# Patient Record
Sex: Female | Born: 1965 | Race: Black or African American | Hispanic: No | Marital: Single | Smoking: Current every day smoker
Health system: Southern US, Community
[De-identification: ages and names within clinical notes are randomized; demographics above are authoritative.]

## PROBLEM LIST (undated history)

## (undated) DIAGNOSIS — D649 Anemia, unspecified: Secondary | ICD-10-CM

## (undated) DIAGNOSIS — L309 Dermatitis, unspecified: Secondary | ICD-10-CM

## (undated) DIAGNOSIS — N179 Acute kidney failure, unspecified: Secondary | ICD-10-CM

## (undated) DIAGNOSIS — F329 Major depressive disorder, single episode, unspecified: Secondary | ICD-10-CM

## (undated) DIAGNOSIS — F209 Schizophrenia, unspecified: Secondary | ICD-10-CM

## (undated) DIAGNOSIS — R569 Unspecified convulsions: Secondary | ICD-10-CM

## (undated) DIAGNOSIS — M419 Scoliosis, unspecified: Secondary | ICD-10-CM

## (undated) DIAGNOSIS — F1411 Cocaine abuse, in remission: Secondary | ICD-10-CM

## (undated) DIAGNOSIS — M1990 Unspecified osteoarthritis, unspecified site: Secondary | ICD-10-CM

## (undated) DIAGNOSIS — F319 Bipolar disorder, unspecified: Secondary | ICD-10-CM

## (undated) DIAGNOSIS — R0600 Dyspnea, unspecified: Secondary | ICD-10-CM

## (undated) DIAGNOSIS — J45909 Unspecified asthma, uncomplicated: Secondary | ICD-10-CM

## (undated) DIAGNOSIS — I639 Cerebral infarction, unspecified: Secondary | ICD-10-CM

## (undated) DIAGNOSIS — T7840XA Allergy, unspecified, initial encounter: Secondary | ICD-10-CM

## (undated) DIAGNOSIS — R450 Nervousness: Secondary | ICD-10-CM

## (undated) DIAGNOSIS — F32A Depression, unspecified: Secondary | ICD-10-CM

## (undated) DIAGNOSIS — Z720 Tobacco use: Secondary | ICD-10-CM

## (undated) HISTORY — PX: DILATION AND CURETTAGE OF UTERUS: SHX78

## (undated) HISTORY — DX: Unspecified osteoarthritis, unspecified site: M19.90

## (undated) HISTORY — DX: Dermatitis, unspecified: L30.9

## (undated) HISTORY — DX: Scoliosis, unspecified: M41.9

## (undated) HISTORY — DX: Cocaine abuse, in remission: F14.11

## (undated) HISTORY — DX: Nervousness: R45.0

---

## 1998-02-04 DIAGNOSIS — I219 Acute myocardial infarction, unspecified: Secondary | ICD-10-CM

## 1998-02-04 HISTORY — DX: Acute myocardial infarction, unspecified: I21.9

## 1998-02-04 HISTORY — PX: APPENDECTOMY: SHX54

## 2004-04-10 ENCOUNTER — Emergency Department: Payer: Self-pay | Admitting: Emergency Medicine

## 2004-11-05 ENCOUNTER — Other Ambulatory Visit: Payer: Self-pay

## 2004-11-05 ENCOUNTER — Emergency Department: Payer: Self-pay | Admitting: Emergency Medicine

## 2005-02-04 DIAGNOSIS — I639 Cerebral infarction, unspecified: Secondary | ICD-10-CM

## 2005-02-04 HISTORY — DX: Cerebral infarction, unspecified: I63.9

## 2005-02-20 ENCOUNTER — Other Ambulatory Visit: Payer: Self-pay

## 2005-02-20 ENCOUNTER — Emergency Department: Payer: Self-pay | Admitting: Emergency Medicine

## 2005-03-25 ENCOUNTER — Emergency Department: Payer: Self-pay | Admitting: Unknown Physician Specialty

## 2005-03-26 ENCOUNTER — Emergency Department: Payer: Self-pay | Admitting: Emergency Medicine

## 2005-03-27 ENCOUNTER — Emergency Department: Payer: Self-pay | Admitting: Unknown Physician Specialty

## 2005-05-25 ENCOUNTER — Emergency Department: Payer: Self-pay | Admitting: Emergency Medicine

## 2005-10-25 ENCOUNTER — Emergency Department: Payer: Self-pay | Admitting: Emergency Medicine

## 2005-12-09 ENCOUNTER — Other Ambulatory Visit: Payer: Self-pay

## 2005-12-09 ENCOUNTER — Emergency Department: Payer: Self-pay | Admitting: Emergency Medicine

## 2005-12-25 ENCOUNTER — Emergency Department: Payer: Self-pay | Admitting: Emergency Medicine

## 2008-04-17 ENCOUNTER — Observation Stay: Payer: Self-pay | Admitting: Internal Medicine

## 2008-07-17 ENCOUNTER — Inpatient Hospital Stay: Payer: Self-pay | Admitting: Internal Medicine

## 2008-07-23 ENCOUNTER — Emergency Department: Payer: Self-pay | Admitting: Emergency Medicine

## 2008-07-25 ENCOUNTER — Emergency Department: Payer: Self-pay | Admitting: Emergency Medicine

## 2008-07-27 ENCOUNTER — Emergency Department: Payer: Self-pay | Admitting: Emergency Medicine

## 2009-01-15 ENCOUNTER — Emergency Department: Payer: Self-pay | Admitting: Internal Medicine

## 2009-09-08 ENCOUNTER — Inpatient Hospital Stay: Payer: Self-pay | Admitting: Internal Medicine

## 2010-08-23 ENCOUNTER — Inpatient Hospital Stay: Payer: Self-pay | Admitting: Internal Medicine

## 2010-09-05 ENCOUNTER — Emergency Department: Payer: Self-pay

## 2010-10-11 ENCOUNTER — Emergency Department: Payer: Self-pay | Admitting: *Deleted

## 2010-12-26 ENCOUNTER — Emergency Department: Payer: Self-pay | Admitting: Emergency Medicine

## 2011-07-07 ENCOUNTER — Emergency Department: Payer: Self-pay | Admitting: Unknown Physician Specialty

## 2011-09-25 ENCOUNTER — Emergency Department: Payer: Self-pay | Admitting: Emergency Medicine

## 2011-09-26 LAB — CBC WITH DIFFERENTIAL/PLATELET
Basophil %: 0.9 %
HGB: 13.6 g/dL (ref 12.0–16.0)
Lymphocyte #: 2.5 10*3/uL (ref 1.0–3.6)
Lymphocyte %: 26.7 %
MCHC: 34.7 g/dL (ref 32.0–36.0)
MCV: 94 fL (ref 80–100)
Monocyte #: 0.5 x10 3/mm (ref 0.2–0.9)
Monocyte %: 4.9 %
Neutrophil #: 6.2 10*3/uL (ref 1.4–6.5)
Neutrophil %: 66.8 %
RBC: 4.18 10*6/uL (ref 3.80–5.20)
RDW: 13.9 % (ref 11.5–14.5)
WBC: 9.2 10*3/uL (ref 3.6–11.0)

## 2011-09-26 LAB — COMPREHENSIVE METABOLIC PANEL
Albumin: 4 g/dL (ref 3.4–5.0)
Alkaline Phosphatase: 88 U/L (ref 50–136)
Bilirubin,Total: 0.3 mg/dL (ref 0.2–1.0)
Calcium, Total: 9.3 mg/dL (ref 8.5–10.1)
Co2: 25 mmol/L (ref 21–32)
EGFR (Non-African Amer.): >60
Glucose: 77 mg/dL (ref 65–99)
Osmolality: 287 (ref 275–301)
SGOT(AST): 63 U/L — ABNORMAL HIGH (ref 15–37)
SGPT (ALT): 46 U/L (ref 12–78)

## 2011-09-27 LAB — URINALYSIS, COMPLETE
Bilirubin,UR: NEGATIVE
Blood: NEGATIVE
Glucose,UR: NEGATIVE mg/dL (ref 0–75)
Ketone: NEGATIVE
Leukocyte Esterase: NEGATIVE
Ph: 5 (ref 4.5–8.0)
RBC,UR: <1 /HPF (ref 0–5)
Squamous Epithelial: 1
WBC UR: 1 /HPF (ref 0–5)

## 2011-09-27 LAB — COMPREHENSIVE METABOLIC PANEL
BUN: 7 mg/dL (ref 7–18)
Bilirubin,Total: 0.3 mg/dL (ref 0.2–1.0)
Calcium, Total: 9.2 mg/dL (ref 8.5–10.1)
Chloride: 108 mmol/L — ABNORMAL HIGH (ref 98–107)
Co2: 24 mmol/L (ref 21–32)
Creatinine: 0.62 mg/dL (ref 0.60–1.30)
EGFR (African American): >60
EGFR (Non-African Amer.): >60
Glucose: 78 mg/dL (ref 65–99)
Osmolality: 280 (ref 275–301)
Potassium: 3.8 mmol/L (ref 3.5–5.1)
SGOT(AST): 69 U/L — ABNORMAL HIGH (ref 15–37)
Sodium: 142 mmol/L (ref 136–145)
Total Protein: 8.1 g/dL (ref 6.4–8.2)

## 2011-09-27 LAB — VALPROIC ACID LEVEL: Valproic Acid: <3 ug/mL — ABNORMAL LOW

## 2011-09-27 LAB — CBC
HCT: 40.3 % (ref 35.0–47.0)
HGB: 13.7 g/dL (ref 12.0–16.0)
MCHC: 34.1 g/dL (ref 32.0–36.0)
RBC: 4.27 10*6/uL (ref 3.80–5.20)
WBC: 5.6 10*3/uL (ref 3.6–11.0)

## 2011-09-27 LAB — MAGNESIUM: Magnesium: 2.2 mg/dL

## 2011-09-27 LAB — DRUG SCREEN, URINE
Amphetamines, Ur Screen: NEGATIVE (ref ?–1000)
Cannabinoid 50 Ng, Ur ~~LOC~~: NEGATIVE (ref ?–50)
Cocaine Metabolite,Ur ~~LOC~~: POSITIVE (ref ?–300)
MDMA (Ecstasy)Ur Screen: NEGATIVE (ref ?–500)
Methadone, Ur Screen: NEGATIVE (ref ?–300)
Opiate, Ur Screen: NEGATIVE (ref ?–300)
Phencyclidine (PCP) Ur S: NEGATIVE (ref ?–25)
Tricyclic, Ur Screen: NEGATIVE (ref ?–1000)

## 2011-09-27 LAB — CK TOTAL AND CKMB (NOT AT ARMC): CK-MB: 0.7 ng/mL (ref 0.5–3.6)

## 2011-09-27 LAB — ETHANOL: Ethanol %: 0.157 % — ABNORMAL HIGH (ref 0.000–0.080)

## 2011-09-27 LAB — TROPONIN I: Troponin-I: <0.02 ng/mL

## 2011-09-28 LAB — CBC WITH DIFFERENTIAL/PLATELET
Basophil #: 0 10*3/uL (ref 0.0–0.1)
Eosinophil #: 0.1 10*3/uL (ref 0.0–0.7)
Eosinophil %: 1.4 %
HCT: 36.5 % (ref 35.0–47.0)
HGB: 12.4 g/dL (ref 12.0–16.0)
Lymphocyte %: 36.7 %
MCHC: 33.8 g/dL (ref 32.0–36.0)
MCV: 94 fL (ref 80–100)
Neutrophil #: 1.9 10*3/uL (ref 1.4–6.5)
Neutrophil %: 49.7 %
Platelet: 183 10*3/uL (ref 150–440)
RBC: 3.89 10*6/uL (ref 3.80–5.20)
RDW: 14 % (ref 11.5–14.5)
WBC: 3.9 10*3/uL (ref 3.6–11.0)

## 2011-09-28 LAB — PROTIME-INR
INR: 0.9
Prothrombin Time: 12.2 secs (ref 11.5–14.7)

## 2011-09-28 LAB — COMPREHENSIVE METABOLIC PANEL
Anion Gap: 4 — ABNORMAL LOW (ref 7–16)
Bilirubin,Total: 0.8 mg/dL (ref 0.2–1.0)
Calcium, Total: 8.9 mg/dL (ref 8.5–10.1)
Co2: 28 mmol/L (ref 21–32)
EGFR (African American): >60
EGFR (Non-African Amer.): >60
Glucose: 87 mg/dL (ref 65–99)
Osmolality: 276 (ref 275–301)
Potassium: 3.6 mmol/L (ref 3.5–5.1)
SGPT (ALT): 32 U/L (ref 12–78)
Sodium: 140 mmol/L (ref 136–145)

## 2011-09-29 ENCOUNTER — Ambulatory Visit: Payer: Self-pay | Admitting: Urology

## 2011-09-29 ENCOUNTER — Inpatient Hospital Stay: Payer: Self-pay | Admitting: Internal Medicine

## 2011-09-29 LAB — URINALYSIS, COMPLETE
Bacteria: NONE SEEN
Specific Gravity: 1.011 (ref 1.003–1.030)
Squamous Epithelial: NONE SEEN

## 2011-09-29 LAB — PHENYTOIN LEVEL, TOTAL: Dilantin: 13.9 ug/mL (ref 10.0–20.0)

## 2011-09-30 LAB — CBC WITH DIFFERENTIAL/PLATELET
Basophil #: 0 10*3/uL (ref 0.0–0.1)
Basophil %: 0.6 %
Eosinophil #: 0.1 10*3/uL (ref 0.0–0.7)
HCT: 36.3 % (ref 35.0–47.0)
HGB: 12.3 g/dL (ref 12.0–16.0)
Lymphocyte %: 44.3 %
MCH: 32 pg (ref 26.0–34.0)
MCHC: 33.8 g/dL (ref 32.0–36.0)
Monocyte #: 0.5 x10 3/mm (ref 0.2–0.9)
Neutrophil #: 2.1 10*3/uL (ref 1.4–6.5)
Neutrophil %: 42.2 %
Platelet: 168 10*3/uL (ref 150–440)
RDW: 13.9 % (ref 11.5–14.5)
WBC: 4.9 10*3/uL (ref 3.6–11.0)

## 2011-09-30 LAB — BASIC METABOLIC PANEL
Calcium, Total: 8.9 mg/dL (ref 8.5–10.1)
Chloride: 106 mmol/L (ref 98–107)
Osmolality: 279 (ref 275–301)
Potassium: 4 mmol/L (ref 3.5–5.1)
Sodium: 142 mmol/L (ref 136–145)

## 2011-10-01 LAB — URINALYSIS, COMPLETE
Bilirubin,UR: NEGATIVE
Ketone: NEGATIVE
Nitrite: NEGATIVE
Ph: 7 (ref 4.5–8.0)
Protein: NEGATIVE
Squamous Epithelial: 11
WBC UR: 2 /HPF (ref 0–5)

## 2011-10-02 LAB — URINE CULTURE

## 2011-10-04 LAB — CREATININE, SERUM
Creatinine: 0.69 mg/dL (ref 0.60–1.30)
EGFR (Non-African Amer.): >60

## 2011-11-06 ENCOUNTER — Emergency Department: Payer: Self-pay | Admitting: Emergency Medicine

## 2011-11-06 LAB — URINALYSIS, COMPLETE
Bacteria: NONE SEEN
Bilirubin,UR: NEGATIVE
Blood: NEGATIVE
Glucose,UR: NEGATIVE mg/dL (ref 0–75)
Ketone: NEGATIVE
Leukocyte Esterase: NEGATIVE
Nitrite: NEGATIVE
Protein: NEGATIVE
RBC,UR: NONE SEEN /HPF (ref 0–5)
WBC UR: NONE SEEN /HPF (ref 0–5)

## 2011-11-06 LAB — COMPREHENSIVE METABOLIC PANEL
Albumin: 4.1 g/dL (ref 3.4–5.0)
Alkaline Phosphatase: 112 U/L (ref 50–136)
Anion Gap: 14 (ref 7–16)
Bilirubin,Total: 0.3 mg/dL (ref 0.2–1.0)
Calcium, Total: 8.9 mg/dL (ref 8.5–10.1)
Creatinine: 0.69 mg/dL (ref 0.60–1.30)
EGFR (Non-African Amer.): >60
Glucose: 84 mg/dL (ref 65–99)
Osmolality: 287 (ref 275–301)
Potassium: 3.9 mmol/L (ref 3.5–5.1)
Sodium: 145 mmol/L (ref 136–145)
Total Protein: 7.9 g/dL (ref 6.4–8.2)

## 2011-11-06 LAB — ETHANOL
Ethanol %: 0.239 % — ABNORMAL HIGH (ref 0.000–0.080)
Ethanol: 239 mg/dL

## 2011-11-06 LAB — CBC
MCV: 94 fL (ref 80–100)
Platelet: 156 10*3/uL (ref 150–440)
RBC: 4.02 10*6/uL (ref 3.80–5.20)
RDW: 13.3 % (ref 11.5–14.5)
WBC: 6.3 10*3/uL (ref 3.6–11.0)

## 2011-11-06 LAB — RAPID HIV-1/2 QL/CONFIRM: HIV-1/2,Rapid Ql: NEGATIVE

## 2011-11-08 ENCOUNTER — Emergency Department: Payer: Self-pay | Admitting: Emergency Medicine

## 2011-11-08 LAB — DRUG SCREEN, URINE
Amphetamines, Ur Screen: NEGATIVE (ref ?–1000)
Barbiturates, Ur Screen: NEGATIVE (ref ?–200)
Benzodiazepine, Ur Scrn: NEGATIVE (ref ?–200)
Methadone, Ur Screen: NEGATIVE (ref ?–300)
Phencyclidine (PCP) Ur S: NEGATIVE (ref ?–25)
Tricyclic, Ur Screen: NEGATIVE (ref ?–1000)

## 2011-11-08 LAB — CBC
HCT: 39.9 % (ref 35.0–47.0)
HGB: 13.5 g/dL (ref 12.0–16.0)
MCH: 32.1 pg (ref 26.0–34.0)
MCV: 95 fL (ref 80–100)
RBC: 4.19 10*6/uL (ref 3.80–5.20)
WBC: 6.8 10*3/uL (ref 3.6–11.0)

## 2011-11-08 LAB — CK TOTAL AND CKMB (NOT AT ARMC)
CK, Total: 186 U/L (ref 21–215)
CK-MB: 1.4 ng/mL (ref 0.5–3.6)

## 2011-11-08 LAB — TROPONIN I: Troponin-I: <0.02 ng/mL

## 2011-11-09 LAB — URINALYSIS, COMPLETE
Bacteria: NEGATIVE
Bilirubin,UR: NEGATIVE
Ketone: NEGATIVE
Leukocyte Esterase: NEGATIVE
Nitrite: NEGATIVE
WBC UR: NONE SEEN /HPF (ref 0–5)

## 2011-11-09 LAB — BASIC METABOLIC PANEL
Anion Gap: 12 (ref 7–16)
BUN: 6 mg/dL — ABNORMAL LOW (ref 7–18)
Calcium, Total: 9.3 mg/dL (ref 8.5–10.1)
Co2: 25 mmol/L (ref 21–32)
Creatinine: 0.7 mg/dL (ref 0.60–1.30)
Potassium: 3.4 mmol/L — ABNORMAL LOW (ref 3.5–5.1)
Sodium: 142 mmol/L (ref 136–145)

## 2011-11-09 LAB — ETHANOL: Ethanol: 247 mg/dL

## 2011-11-09 LAB — TROPONIN I: Troponin-I: <0.02 ng/mL

## 2011-11-09 LAB — CK TOTAL AND CKMB (NOT AT ARMC)
CK, Total: 170 U/L (ref 21–215)
CK-MB: 1.4 ng/mL (ref 0.5–3.6)

## 2011-12-18 ENCOUNTER — Emergency Department: Payer: Self-pay | Admitting: Emergency Medicine

## 2011-12-18 LAB — COMPREHENSIVE METABOLIC PANEL
Albumin: 4.1 g/dL (ref 3.4–5.0)
Alkaline Phosphatase: 90 U/L (ref 50–136)
BUN: 6 mg/dL — ABNORMAL LOW (ref 7–18)
Bilirubin,Total: 0.5 mg/dL (ref 0.2–1.0)
Calcium, Total: 9.5 mg/dL (ref 8.5–10.1)
Co2: 26 mmol/L (ref 21–32)
Creatinine: 0.6 mg/dL (ref 0.60–1.30)
Glucose: 87 mg/dL (ref 65–99)
Potassium: 4 mmol/L (ref 3.5–5.1)
SGPT (ALT): 26 U/L (ref 12–78)
Total Protein: 8.5 g/dL — ABNORMAL HIGH (ref 6.4–8.2)

## 2011-12-18 LAB — CBC
HCT: 40.1 % (ref 35.0–47.0)
MCH: 34.3 pg — ABNORMAL HIGH (ref 26.0–34.0)
MCV: 98 fL (ref 80–100)
RBC: 4.1 10*6/uL (ref 3.80–5.20)
RDW: 14.1 % (ref 11.5–14.5)
WBC: 7 10*3/uL (ref 3.6–11.0)

## 2011-12-18 LAB — URINALYSIS, COMPLETE
Bilirubin,UR: NEGATIVE
Blood: NEGATIVE
Glucose,UR: NEGATIVE mg/dL (ref 0–75)
Ketone: NEGATIVE
Nitrite: NEGATIVE
Specific Gravity: 1.003 (ref 1.003–1.030)
Squamous Epithelial: 4

## 2011-12-18 LAB — VALPROIC ACID LEVEL: Valproic Acid: <3 ug/mL — ABNORMAL LOW

## 2012-04-02 ENCOUNTER — Emergency Department: Payer: Self-pay | Admitting: Emergency Medicine

## 2012-04-02 LAB — URINALYSIS, COMPLETE
Bilirubin,UR: NEGATIVE
Blood: NEGATIVE
Glucose,UR: NEGATIVE mg/dL (ref 0–75)
Leukocyte Esterase: NEGATIVE
Nitrite: NEGATIVE
RBC,UR: 1 /HPF (ref 0–5)
Specific Gravity: 1.001 (ref 1.003–1.030)
Squamous Epithelial: <1

## 2012-04-02 LAB — COMPREHENSIVE METABOLIC PANEL
Albumin: 4.3 g/dL (ref 3.4–5.0)
Alkaline Phosphatase: 91 U/L (ref 50–136)
Anion Gap: 9 (ref 7–16)
BUN: 7 mg/dL (ref 7–18)
Bilirubin,Total: 0.3 mg/dL (ref 0.2–1.0)
Calcium, Total: 9.6 mg/dL (ref 8.5–10.1)
Creatinine: 0.59 mg/dL — ABNORMAL LOW (ref 0.60–1.30)
Glucose: 79 mg/dL (ref 65–99)
SGOT(AST): 40 U/L — ABNORMAL HIGH (ref 15–37)
SGPT (ALT): 27 U/L (ref 12–78)
Sodium: 139 mmol/L (ref 136–145)
Total Protein: 8.7 g/dL — ABNORMAL HIGH (ref 6.4–8.2)

## 2012-04-02 LAB — CBC WITH DIFFERENTIAL/PLATELET
Basophil %: 1 %
Eosinophil #: 0 10*3/uL (ref 0.0–0.7)
HCT: 43.9 % (ref 35.0–47.0)
Lymphocyte #: 2.6 10*3/uL (ref 1.0–3.6)
Lymphocyte %: 38 %
MCH: 29.7 pg (ref 26.0–34.0)
MCV: 94 fL (ref 80–100)
Monocyte #: 0.3 x10 3/mm (ref 0.2–0.9)
Monocyte %: 3.9 %
Neutrophil #: 3.9 10*3/uL (ref 1.4–6.5)
RDW: 12.4 % (ref 11.5–14.5)
WBC: 6.8 10*3/uL (ref 3.6–11.0)

## 2012-04-02 LAB — VALPROIC ACID LEVEL: Valproic Acid: <3 ug/mL — ABNORMAL LOW

## 2012-04-02 LAB — DRUG SCREEN, URINE
Amphetamines, Ur Screen: NEGATIVE (ref ?–1000)
Barbiturates, Ur Screen: NEGATIVE (ref ?–200)
Benzodiazepine, Ur Scrn: NEGATIVE (ref ?–200)
Cannabinoid 50 Ng, Ur ~~LOC~~: NEGATIVE (ref ?–50)
MDMA (Ecstasy)Ur Screen: NEGATIVE (ref ?–500)
Methadone, Ur Screen: NEGATIVE (ref ?–300)
Phencyclidine (PCP) Ur S: NEGATIVE (ref ?–25)

## 2012-04-02 LAB — ETHANOL
Ethanol %: 0.17 % — ABNORMAL HIGH (ref 0.000–0.080)
Ethanol: 170 mg/dL

## 2012-04-24 ENCOUNTER — Emergency Department: Payer: Self-pay | Admitting: Emergency Medicine

## 2012-04-24 LAB — CBC WITH DIFFERENTIAL/PLATELET
Basophil #: 0.1 10*3/uL (ref 0.0–0.1)
Eosinophil %: 0.6 %
Lymphocyte #: 2.1 10*3/uL (ref 1.0–3.6)
MCH: 32.3 pg (ref 26.0–34.0)
MCHC: 34 g/dL (ref 32.0–36.0)
MCV: 95 fL (ref 80–100)
Monocyte #: 0.4 x10 3/mm (ref 0.2–0.9)
Monocyte %: 5.5 %
Neutrophil %: 67 %
Platelet: 161 10*3/uL (ref 150–440)
RBC: 3.97 10*6/uL (ref 3.80–5.20)
WBC: 7.8 10*3/uL (ref 3.6–11.0)

## 2012-04-24 LAB — COMPREHENSIVE METABOLIC PANEL
Albumin: 3.5 g/dL (ref 3.4–5.0)
BUN: 11 mg/dL (ref 7–18)
Bilirubin,Total: 0.4 mg/dL (ref 0.2–1.0)
Calcium, Total: 8.6 mg/dL (ref 8.5–10.1)
Chloride: 108 mmol/L — ABNORMAL HIGH (ref 98–107)
Creatinine: 0.33 mg/dL — ABNORMAL LOW (ref 0.60–1.30)
EGFR (Non-African Amer.): >60
Potassium: 4.9 mmol/L (ref 3.5–5.1)
SGPT (ALT): 20 U/L (ref 12–78)
Sodium: 138 mmol/L (ref 136–145)
Total Protein: 7.4 g/dL (ref 6.4–8.2)

## 2012-04-24 LAB — PREGNANCY, URINE: Pregnancy Test, Urine: NEGATIVE m[IU]/mL

## 2012-04-24 LAB — URINALYSIS, COMPLETE
Ketone: NEGATIVE
Leukocyte Esterase: NEGATIVE
Nitrite: NEGATIVE
Ph: 5 (ref 4.5–8.0)
Protein: NEGATIVE
RBC,UR: 1 /HPF (ref 0–5)
Specific Gravity: 1.006 (ref 1.003–1.030)

## 2012-04-24 LAB — DRUG SCREEN, URINE
Barbiturates, Ur Screen: NEGATIVE (ref ?–200)
Cocaine Metabolite,Ur ~~LOC~~: POSITIVE (ref ?–300)
MDMA (Ecstasy)Ur Screen: NEGATIVE (ref ?–500)

## 2012-05-02 ENCOUNTER — Emergency Department: Payer: Self-pay | Admitting: Emergency Medicine

## 2012-05-03 LAB — BASIC METABOLIC PANEL
Anion Gap: 8 (ref 7–16)
Calcium, Total: 8.9 mg/dL (ref 8.5–10.1)
Co2: 26 mmol/L (ref 21–32)
Creatinine: 0.56 mg/dL — ABNORMAL LOW (ref 0.60–1.30)
EGFR (African American): >60
EGFR (Non-African Amer.): >60
Glucose: 83 mg/dL (ref 65–99)
Osmolality: 276 (ref 275–301)
Potassium: 3.7 mmol/L (ref 3.5–5.1)

## 2012-05-03 LAB — CBC WITH DIFFERENTIAL/PLATELET
Basophil %: 1.7 %
Lymphocyte %: 33.3 %
MCHC: 34.6 g/dL (ref 32.0–36.0)
MCV: 93 fL (ref 80–100)
Monocyte #: 0.4 x10 3/mm (ref 0.2–0.9)
Neutrophil #: 4.7 10*3/uL (ref 1.4–6.5)
Neutrophil %: 60.1 %
Platelet: 212 10*3/uL (ref 150–440)
RBC: 4.16 10*6/uL (ref 3.80–5.20)
RDW: 12.4 % (ref 11.5–14.5)
WBC: 7.8 10*3/uL (ref 3.6–11.0)

## 2012-05-03 LAB — URINALYSIS, COMPLETE
Blood: NEGATIVE
Ketone: NEGATIVE
Leukocyte Esterase: NEGATIVE
Ph: 6 (ref 4.5–8.0)
Protein: NEGATIVE
RBC,UR: NONE SEEN /HPF (ref 0–5)
Specific Gravity: 1.002 (ref 1.003–1.030)

## 2012-05-03 LAB — VALPROIC ACID LEVEL: Valproic Acid: 14 ug/mL — ABNORMAL LOW

## 2012-05-03 LAB — PHENYTOIN LEVEL, TOTAL: Dilantin: 3.1 ug/mL — ABNORMAL LOW (ref 10.0–20.0)

## 2012-06-29 ENCOUNTER — Emergency Department: Payer: Self-pay | Admitting: Emergency Medicine

## 2012-06-29 LAB — COMPREHENSIVE METABOLIC PANEL
Albumin: 3.9 g/dL (ref 3.4–5.0)
Alkaline Phosphatase: 112 U/L (ref 50–136)
Bilirubin,Total: 0.4 mg/dL (ref 0.2–1.0)
Calcium, Total: 9.2 mg/dL (ref 8.5–10.1)
Co2: 23 mmol/L (ref 21–32)
EGFR (African American): >60
Glucose: 82 mg/dL (ref 65–99)
Osmolality: 271 (ref 275–301)
Potassium: 3.4 mmol/L — ABNORMAL LOW (ref 3.5–5.1)
SGOT(AST): 118 U/L — ABNORMAL HIGH (ref 15–37)
Total Protein: 8.1 g/dL (ref 6.4–8.2)

## 2012-06-29 LAB — CBC
HCT: 38.5 % (ref 35.0–47.0)
MCHC: 34.6 g/dL (ref 32.0–36.0)
MCV: 92 fL (ref 80–100)
Platelet: 148 10*3/uL — ABNORMAL LOW (ref 150–440)
RBC: 4.19 10*6/uL (ref 3.80–5.20)
RDW: 14 % (ref 11.5–14.5)
WBC: 6 10*3/uL (ref 3.6–11.0)

## 2012-06-29 LAB — URINALYSIS, COMPLETE
Leukocyte Esterase: NEGATIVE
Ph: 5 (ref 4.5–8.0)
WBC UR: <1 /HPF (ref 0–5)

## 2012-06-29 LAB — DRUG SCREEN, URINE
Amphetamines, Ur Screen: NEGATIVE (ref ?–1000)
Barbiturates, Ur Screen: NEGATIVE (ref ?–200)
Cannabinoid 50 Ng, Ur ~~LOC~~: NEGATIVE (ref ?–50)
Cocaine Metabolite,Ur ~~LOC~~: POSITIVE (ref ?–300)
MDMA (Ecstasy)Ur Screen: NEGATIVE (ref ?–500)
Opiate, Ur Screen: NEGATIVE (ref ?–300)
Phencyclidine (PCP) Ur S: NEGATIVE (ref ?–25)
Tricyclic, Ur Screen: NEGATIVE (ref ?–1000)

## 2012-06-29 LAB — ETHANOL
Ethanol %: 0.165 % — ABNORMAL HIGH (ref 0.000–0.080)
Ethanol: 165 mg/dL

## 2012-06-29 LAB — PHENYTOIN LEVEL, TOTAL: Dilantin: <0.4 ug/mL — ABNORMAL LOW (ref 10.0–20.0)

## 2012-07-10 ENCOUNTER — Emergency Department: Payer: Self-pay | Admitting: Emergency Medicine

## 2012-07-18 ENCOUNTER — Emergency Department: Payer: Self-pay | Admitting: Emergency Medicine

## 2012-07-18 LAB — DRUG SCREEN, URINE
Amphetamines, Ur Screen: NEGATIVE (ref ?–1000)
Barbiturates, Ur Screen: NEGATIVE (ref ?–200)
Benzodiazepine, Ur Scrn: NEGATIVE (ref ?–200)
Cannabinoid 50 Ng, Ur ~~LOC~~: NEGATIVE (ref ?–50)
MDMA (Ecstasy)Ur Screen: NEGATIVE (ref ?–500)
Methadone, Ur Screen: NEGATIVE (ref ?–300)
Opiate, Ur Screen: NEGATIVE (ref ?–300)
Tricyclic, Ur Screen: NEGATIVE (ref ?–1000)

## 2012-07-18 LAB — CBC WITH DIFFERENTIAL/PLATELET
Basophil #: 0.1 10*3/uL (ref 0.0–0.1)
Basophil %: 1.5 %
HCT: 39.2 % (ref 35.0–47.0)
HGB: 13.4 g/dL (ref 12.0–16.0)
Lymphocyte %: 51.6 %
MCH: 31.5 pg (ref 26.0–34.0)
MCHC: 34.2 g/dL (ref 32.0–36.0)
Monocyte %: 5.1 %
Neutrophil #: 2.1 10*3/uL (ref 1.4–6.5)
Platelet: 155 10*3/uL (ref 150–440)
RBC: 4.26 10*6/uL (ref 3.80–5.20)
RDW: 13.5 % (ref 11.5–14.5)
WBC: 5.3 10*3/uL (ref 3.6–11.0)

## 2012-07-18 LAB — COMPREHENSIVE METABOLIC PANEL
Albumin: 3.8 g/dL (ref 3.4–5.0)
Alkaline Phosphatase: 81 U/L (ref 50–136)
Anion Gap: 9 (ref 7–16)
Bilirubin,Total: 0.3 mg/dL (ref 0.2–1.0)
Calcium, Total: 9.1 mg/dL (ref 8.5–10.1)
Chloride: 106 mmol/L (ref 98–107)
Co2: 25 mmol/L (ref 21–32)
EGFR (African American): >60
Osmolality: 277 (ref 275–301)
Potassium: 3.7 mmol/L (ref 3.5–5.1)
SGOT(AST): 70 U/L — ABNORMAL HIGH (ref 15–37)
Sodium: 140 mmol/L (ref 136–145)
Total Protein: 7.6 g/dL (ref 6.4–8.2)

## 2012-07-18 LAB — ETHANOL
Ethanol %: 0.092 % — ABNORMAL HIGH (ref 0.000–0.080)
Ethanol: 92 mg/dL

## 2012-07-18 LAB — URINALYSIS, COMPLETE
Bilirubin,UR: NEGATIVE
Glucose,UR: NEGATIVE mg/dL (ref 0–75)
Ph: 5 (ref 4.5–8.0)
Protein: 30
RBC,UR: 1 /HPF (ref 0–5)
Specific Gravity: 1.019 (ref 1.003–1.030)

## 2012-07-18 LAB — MAGNESIUM: Magnesium: 1.8 mg/dL

## 2012-08-02 ENCOUNTER — Emergency Department: Payer: Self-pay | Admitting: Emergency Medicine

## 2012-08-02 LAB — BASIC METABOLIC PANEL
Anion Gap: 9 (ref 7–16)
BUN: 6 mg/dL — ABNORMAL LOW (ref 7–18)
Chloride: 104 mmol/L (ref 98–107)
Co2: 24 mmol/L (ref 21–32)
Creatinine: 0.69 mg/dL (ref 0.60–1.30)
EGFR (Non-African Amer.): >60
Glucose: 161 mg/dL — ABNORMAL HIGH (ref 65–99)
Osmolality: 275 (ref 275–301)
Potassium: 3.7 mmol/L (ref 3.5–5.1)
Sodium: 137 mmol/L (ref 136–145)

## 2012-08-02 LAB — CBC
HGB: 13.8 g/dL (ref 12.0–16.0)
MCH: 31.4 pg (ref 26.0–34.0)
MCHC: 34.3 g/dL (ref 32.0–36.0)
RBC: 4.4 10*6/uL (ref 3.80–5.20)
RDW: 12.5 % (ref 11.5–14.5)

## 2012-08-02 LAB — PHENYTOIN LEVEL, TOTAL: Dilantin: <0.4 ug/mL — ABNORMAL LOW (ref 10.0–20.0)

## 2012-08-24 ENCOUNTER — Emergency Department: Payer: Self-pay | Admitting: Emergency Medicine

## 2013-02-01 ENCOUNTER — Inpatient Hospital Stay: Payer: Self-pay | Admitting: Internal Medicine

## 2013-02-01 LAB — PHENYTOIN LEVEL, TOTAL: Dilantin: 1.9 ug/mL — ABNORMAL LOW (ref 10.0–20.0)

## 2013-02-01 LAB — DRUG SCREEN, URINE
Amphetamines, Ur Screen: NEGATIVE (ref ?–1000)
Cocaine Metabolite,Ur ~~LOC~~: POSITIVE (ref ?–300)
MDMA (Ecstasy)Ur Screen: NEGATIVE (ref ?–500)
Methadone, Ur Screen: NEGATIVE (ref ?–300)
Opiate, Ur Screen: NEGATIVE (ref ?–300)
Phencyclidine (PCP) Ur S: NEGATIVE (ref ?–25)
Tricyclic, Ur Screen: NEGATIVE (ref ?–1000)

## 2013-02-01 LAB — CBC
HGB: 13.9 g/dL (ref 12.0–16.0)
MCHC: 34 g/dL (ref 32.0–36.0)
MCV: 91 fL (ref 80–100)
RDW: 13.9 % (ref 11.5–14.5)

## 2013-02-01 LAB — BASIC METABOLIC PANEL
BUN: 6 mg/dL — ABNORMAL LOW (ref 7–18)
Calcium, Total: 9.8 mg/dL (ref 8.5–10.1)
Co2: 26 mmol/L (ref 21–32)
Creatinine: 0.53 mg/dL — ABNORMAL LOW (ref 0.60–1.30)
EGFR (Non-African Amer.): >60
Glucose: 87 mg/dL (ref 65–99)
Osmolality: 273 (ref 275–301)
Sodium: 138 mmol/L (ref 136–145)

## 2013-02-01 LAB — URINALYSIS, COMPLETE
Bacteria: NONE SEEN
Bilirubin,UR: NEGATIVE
Blood: NEGATIVE
Glucose,UR: NEGATIVE mg/dL (ref 0–75)
Leukocyte Esterase: NEGATIVE
RBC,UR: 57 /HPF (ref 0–5)
Squamous Epithelial: 1

## 2013-02-02 ENCOUNTER — Ambulatory Visit: Payer: Self-pay | Admitting: Neurology

## 2013-02-02 LAB — BASIC METABOLIC PANEL
BUN: 7 mg/dL (ref 7–18)
Calcium, Total: 9.3 mg/dL (ref 8.5–10.1)
Chloride: 105 mmol/L (ref 98–107)
Creatinine: 0.54 mg/dL — ABNORMAL LOW (ref 0.60–1.30)
EGFR (African American): >60
Sodium: 138 mmol/L (ref 136–145)

## 2013-02-02 LAB — LIPID PANEL
Cholesterol: 167 mg/dL (ref 0–200)
HDL Cholesterol: 111 mg/dL — ABNORMAL HIGH (ref 40–60)
Ldl Cholesterol, Calc: 47 mg/dL (ref 0–100)
Triglycerides: 46 mg/dL (ref 0–200)
VLDL Cholesterol, Calc: 9 mg/dL (ref 5–40)

## 2013-02-02 LAB — TSH: Thyroid Stimulating Horm: 1.57 u[IU]/mL

## 2013-05-06 ENCOUNTER — Emergency Department: Payer: Self-pay | Admitting: Emergency Medicine

## 2013-06-23 ENCOUNTER — Emergency Department: Payer: Self-pay | Admitting: Emergency Medicine

## 2013-06-23 LAB — CBC
HCT: 42.5 % (ref 35.0–47.0)
HGB: 14.7 g/dL (ref 12.0–16.0)
MCH: 32.5 pg (ref 26.0–34.0)
MCHC: 34.5 g/dL (ref 32.0–36.0)
MCV: 94 fL (ref 80–100)
PLATELETS: 145 10*3/uL — AB (ref 150–440)
RBC: 4.51 10*6/uL (ref 3.80–5.20)
RDW: 13.2 % (ref 11.5–14.5)
WBC: 8.3 10*3/uL (ref 3.6–11.0)

## 2013-06-23 LAB — BASIC METABOLIC PANEL
ANION GAP: 8 (ref 7–16)
BUN: 11 mg/dL (ref 7–18)
CREATININE: 0.55 mg/dL — AB (ref 0.60–1.30)
Calcium, Total: 9.1 mg/dL (ref 8.5–10.1)
Chloride: 104 mmol/L (ref 98–107)
Co2: 22 mmol/L (ref 21–32)
EGFR (Non-African Amer.): >60
GLUCOSE: 79 mg/dL (ref 65–99)
Osmolality: 267 (ref 275–301)
Potassium: 4.3 mmol/L (ref 3.5–5.1)
SODIUM: 134 mmol/L — AB (ref 136–145)

## 2013-06-23 LAB — URINALYSIS, COMPLETE
BILIRUBIN, UR: NEGATIVE
Blood: NEGATIVE
GLUCOSE, UR: NEGATIVE mg/dL (ref 0–75)
Ketone: NEGATIVE
LEUKOCYTE ESTERASE: NEGATIVE
NITRITE: NEGATIVE
Ph: 5 (ref 4.5–8.0)
Protein: NEGATIVE
RBC,UR: NONE SEEN /HPF (ref 0–5)
Specific Gravity: 1.002 (ref 1.003–1.030)
Squamous Epithelial: <1
WBC UR: NONE SEEN /HPF (ref 0–5)

## 2013-06-23 LAB — DRUG SCREEN, URINE
AMPHETAMINES, UR SCREEN: NEGATIVE (ref ?–1000)
Barbiturates, Ur Screen: NEGATIVE (ref ?–200)
Benzodiazepine, Ur Scrn: NEGATIVE (ref ?–200)
COCAINE METABOLITE, UR ~~LOC~~: POSITIVE (ref ?–300)
Cannabinoid 50 Ng, Ur ~~LOC~~: NEGATIVE (ref ?–50)
MDMA (ECSTASY) UR SCREEN: NEGATIVE (ref ?–500)
METHADONE, UR SCREEN: NEGATIVE (ref ?–300)
Opiate, Ur Screen: NEGATIVE (ref ?–300)
Phencyclidine (PCP) Ur S: NEGATIVE (ref ?–25)
TRICYCLIC, UR SCREEN: NEGATIVE (ref ?–1000)

## 2013-06-23 LAB — PHENYTOIN LEVEL, TOTAL: Dilantin: 0.6 ug/mL — ABNORMAL LOW (ref 10.0–20.0)

## 2013-06-23 LAB — VALPROIC ACID LEVEL

## 2013-08-19 ENCOUNTER — Emergency Department: Payer: Self-pay | Admitting: Emergency Medicine

## 2013-08-27 ENCOUNTER — Emergency Department: Payer: Self-pay | Admitting: Emergency Medicine

## 2013-10-26 ENCOUNTER — Emergency Department: Payer: Self-pay | Admitting: Emergency Medicine

## 2014-05-05 ENCOUNTER — Emergency Department
Admit: 2014-05-05 | Disposition: A | Discharge status: Home / Self Care | Payer: Self-pay | Admitting: Emergency Medicine

## 2014-05-05 LAB — TROPONIN I: Troponin-I: <0.03 ng/mL

## 2014-05-05 LAB — URINALYSIS, COMPLETE
BACTERIA: NONE SEEN
BLOOD: NEGATIVE
Bilirubin,UR: NEGATIVE
GLUCOSE, UR: NEGATIVE mg/dL (ref 0–75)
KETONE: NEGATIVE
LEUKOCYTE ESTERASE: NEGATIVE
Nitrite: NEGATIVE
Ph: 5 (ref 4.5–8.0)
Protein: NEGATIVE
RBC, UR: NONE SEEN /HPF (ref 0–5)
SPECIFIC GRAVITY: 1.001 (ref 1.003–1.030)
SQUAMOUS EPITHELIAL: NONE SEEN
WBC UR: NONE SEEN /HPF (ref 0–5)

## 2014-05-05 LAB — BASIC METABOLIC PANEL
ANION GAP: 7 (ref 7–16)
BUN: 9 mg/dL
CALCIUM: 9.1 mg/dL
Chloride: 109 mmol/L
Co2: 24 mmol/L
Creatinine: 0.56 mg/dL
EGFR (African American): >60
EGFR (Non-African Amer.): >60
Glucose: 93 mg/dL
Potassium: 3.7 mmol/L
SODIUM: 140 mmol/L

## 2014-05-05 LAB — DRUG SCREEN, URINE
Amphetamines, Ur Screen: NEGATIVE
BARBITURATES, UR SCREEN: NEGATIVE
BENZODIAZEPINE, UR SCRN: NEGATIVE
CANNABINOID 50 NG, UR ~~LOC~~: NEGATIVE
Cocaine Metabolite,Ur ~~LOC~~: POSITIVE
MDMA (ECSTASY) UR SCREEN: NEGATIVE
Methadone, Ur Screen: NEGATIVE
Opiate, Ur Screen: NEGATIVE
Phencyclidine (PCP) Ur S: NEGATIVE
Tricyclic, Ur Screen: NEGATIVE

## 2014-05-05 LAB — CBC
HCT: 40.2 % (ref 35.0–47.0)
HGB: 13.4 g/dL (ref 12.0–16.0)
MCH: 31.7 pg (ref 26.0–34.0)
MCHC: 33.4 g/dL (ref 32.0–36.0)
MCV: 95 fL (ref 80–100)
PLATELETS: 150 10*3/uL (ref 150–440)
RBC: 4.24 10*6/uL (ref 3.80–5.20)
RDW: 13.2 % (ref 11.5–14.5)
WBC: 7.5 10*3/uL (ref 3.6–11.0)

## 2014-05-05 LAB — ETHANOL: Ethanol: 220 mg/dL

## 2014-05-05 LAB — PHENYTOIN LEVEL, TOTAL

## 2014-05-24 NOTE — Consult Note (Signed)
PATIENT NAME:  [name], [name] MR#:  [id number] DATE OF BIRTH:  October 04, 1965  DATE OF CONSULTATION:  09/29/2011  REFERRING PHYSICIAN:  Dr. Elisabeth Pigeon  CONSULTING PHYSICIAN:  Marin Olp, MD   PRIMARY CARE PHYSICIAN: [facility]   REASON FOR CONSULTATION: Gross hematuria.   HISTORY OF PRESENT ILLNESS: The patient is a 49 year old African American female who was admitted on 09/27/2011 to the [facility] following recurrent seizures with right lower extremity weakness and numbness. On hospital day #1, 09/28/2011, the patient experienced another seizure and apparently during the seizure pulled on her Foley catheter which had been placed in the Emergency Department due to the patient's inability to ambulate. Following the self-inflicted Foley trauma, gross blood was appreciated. The Foley catheter was removed on hospital day #2, 09/29/2011, at 10 a.m. A urinalysis revealed 3682 red blood cells per high-power field with 751 white blood cells per high-power field. The patient reports urinary frequency since the Foley catheter was removed with voids approximately every hour. There is persistent gross hematuria initially with some clots but the clots have since resolved. There is a sense of incomplete bladder emptying and initial dysuria.   GENITOURINARY HISTORY: Prior to this most recent hospitalization, the patient denies any lower urinary tract symptoms, specifically denying any dysuria, frequency, or urinary urgency. The patient also denies any prior history of urolithiasis. The patient does report a one week history of subjective fevers and sweats but no chills. The patient also reports a two week history of right hip pain worse with ambulation. The patient does report a history of lower back lumbar surgery six years ago at [facility]. The patient is unable to specify the indications for surgery except to mention that she was having left-sided hip pain which was modestly improved  after surgery by about 20%.   REVIEW OF SYSTEMS: As per the 13 system review documented on the patient's history and physical by Dr. Tilda Franco on 09/27/2011. Notable additions to that documented review of systems is under constitutional the patient reports a one week history of subjective fever and sweats without chills. With regard to the genitourinary system, the patient reports menopause seven years ago without menses since. With regard to musculoskeletal, the notable addition is the right hip pain for two weeks worse with ambulation.   PAST MEDICAL HISTORY: The past medical history is also as per the history and physical on admission with the additions as follows:  1. History of seizure disorder since the age of 19 ("epilepsy").  2. Depression (reactive to multiple chronic illnesses with poor control).  3. Asthma.  4. Sinusitis (chronic nasal congestion).    PAST SURGICAL HISTORY: Past surgical history is again as per the history and physical with the following additions:  1. Cesarean section x1.  2. Appendectomy.  3. Back surgery (lumbar six years ago at [facility]).    SOCIAL HISTORY: As per the history and physical on admission with the following modifications. The patient reports drinking one beer a day and denies any history of dependence, blackouts, and DTs. Also, the patient does admit to positive cocaine use one week prior to admission. The patient also has one son and one daughter.   FAMILY HISTORY: As per the admission history and physical with the following addition, history of seizures in her maternal grandmother and mother. Denies any hypertension, diabetes, or cancers.   ALLERGIES: As per the admission history and physical.   MEDICATIONS: Medications as per the admission history and physical with  the addition of albuterol inhaler p.r.n. (t.i.d. p.r.n.).   PHYSICAL EXAMINATION:   VITAL SIGNS: Temperature 98.3 degrees Fahrenheit, blood pressure 96/64, pulse 83 and regular,  respirations 18 and unlabored, room air oxygen saturation 97%.   GENERAL: Well developed, well nourished African American female in no apparent distress, pleasant and cooperative.   HEENT: Normocephalic and atraumatic with extraocular movements intact. Anicteric.   NECK: No masses or bruits.   CHEST: Clear to auscultation with normal respiratory effort.   HEART: Regular rate and rhythm without murmurs, gallops, or rubs. 2+ radial pulses bilaterally.   ABDOMEN: Soft, flat, nontender, nondistended without palpable masses or organomegaly with normoactive bowel sounds but with marked right costovertebral angle tenderness to gentle palpation.   EXTREMITIES: No edema.   NEUROLOGIC: Decreased right lower extremity strength   LYMPHATIC: No palpable cervical or supraclavicular lymph nodes.   LABORATORY DATA: Urinalysis on 09/29/2011 3682 red blood cells per high power field with 751 white blood cells per high-power field 09/28/2011. Sodium 140, potassium 3.6, chloride 108, CO2 28, BUN 4, creatinine 0.68, glucose 87, calcium 8.9, white blood cell count 3.9 thousand, hematocrit 36.5%, platelet count 183,000. PT 12.2 (INR 0.9).   ASSESSMENT:  1. Gross hematuria. This developed after the patient pulled on her Foley catheter last night during a seizure. The patient denies any prior history of gross hematuria or lower urinary tract symptoms. The gross hematuria is most likely due to moderate mucosal trauma from the Foley catheter being pulled during the seizure and we would expect gradual resolution over the next several weeks.  2. Right flank pain with marked costovertebral angle tenderness. This developed after the above-described Foley trauma. Possible etiologies include ureteral obstruction plus or minus right pyelonephritis or pyonephrosis. The patient denies any history of urolithiasis but does report a one week history of subjective fever and sweats with fatigue and weakness, however, did deny any  dysuria or frequency or urgency prior to the Foley trauma. Since the Foley trauma, however, the patient reports urinary frequency every hour with a sense of incomplete emptying and initial dysuria. Furthermore, the urinalysis in addition to the expected hematuria also demonstrated marked pyuria. However, the patient also has a history of possible degenerative disk disease having undergone lumbar surgery six years ago at [facility]. Could the marked costovertebral angle tenderness be due to radiculopathy? Furthermore, could potential degenerative disk disease also be a contributing factor to the patient's right hip pain and right lower extremity numbness and weakness?   RECOMMENDATIONS:  1. Urine for culture and sensitivities.  2. CT scan of the abdomen and pelvis without and with intravenous contrast.  3. Consider Orthopedic consultation for possible degenerative disk disease with radiculopathy.  4. Bladder scan to rule out urinary retention with the recommendation to replace the Foley catheter with a postvoid residual in excess of 150 mL. If the CT scan is negative for urologic pathology, the patient should follow-up with Dr. Rosezetta Schlatter of [facility] at [phone] as an outpatient. If the CT scan demonstrates urologic pathology, then Dr. Irineo Axon should be contacted tomorrow for further recommendations.   Thank you for the opportunity to participate in the care of your patients.   ____________________________ Marin Olp, MD jhk:drc D: 09/29/2011 20:20:00 ET T: 09/30/2011 09:49:22 ET JOB#: [id number]  cc: Marin Olp, MD, <Dictator> Marin Olp MD ELECTRONICALLY SIGNED 10/17/2011 7:02

## 2014-05-24 NOTE — H&P (Signed)
PATIENT NAME:  [name], [name] MR#:  [id number] DATE OF BIRTH:  1965-12-11  DATE OF ADMISSION:  09/27/2011  PRIMARY CARE PHYSICIAN: [facility]  ER PHYSICIAN: Dr. Enedina Finner ADMITTING PHYSICIAN: Dr. Tilda Franco   PRESENTING COMPLAINT: Seizure.   HISTORY: Patient is a 49 year old lady who was seen here in the Emergency Room yesterday with episode of seizure activity and was discharged home after Dilantin loading. Went home and this evening was brought back by emergency medical service as she had another episode at home, has not been able to walk since leaving the hospital yesterday. Caregiver stated patient had been lying down on the bed all day long, unable to move her right lower extremity. No episodes of pain. No fever, shortness of breath, chest pain. No PND, orthopnea, or pedal edema but had episode of seizures while in the bed at which time EMS was activated. En route to the hospital had another episode of generalized tonic-clonic motion with no incontinence. Had some upward eyes and for this she was brought to the Emergency Room and referred to the hospitalist. Work-up here included CT head which showed no acute abnormality. Patient stated that she has not been able to walk since leaving the hospital yesterday because of weakness right lower extremity, unable to move it. Denies any trauma.    REVIEW OF SYSTEMS: CONSTITUTIONAL: Positive for fatigue and weakness but no weight loss or weight gain. EYES: No blurred vision, redness or discharge. ENT: No tinnitus, epistaxis, redness, or difficulty swallowing. RESPIRATORY: Denies any cough, wheezing. CARDIOVASCULAR: No chest pain, PND, orthopnea, pedal edema, palpitations, syncopal episode. GASTROINTESTINAL: Denies nausea, vomiting, diarrhea, abdominal pain, change in bowel habits. GENITOURINARY: No dysuria, frequency, incontinence. HEMATOLOGIC: No anemia, easy bruising, swollen glands. ENDOCRINE: No polyuria, polydipsia. MUSCULOSKELETAL: No joint pain,  redness, swelling but has limited activity right lower extremity due to weakness. NEUROLOGICAL: Has weakness right lower extremity but no dementia, headache. Positive for seizure activity again. No memory loss. PSYCH: No anxiety, depression.   PAST MEDICAL HISTORY:  1. History of seizure disorder. 2. Depression.  3. Asthma.  4. Sinusitis.   PAST SURGICAL HISTORY:  1. Cesarean section. 2. Appendectomy. 3. Back surgery.   SOCIAL HISTORY: Lives with a friend. Drinks about 2 to 4 beers per day. At most 2 to 3 cigarettes per day. Denies any other recreational drug in the past but marijuana, however, her urine drug screen here is positive for cocaine.   FAMILY HISTORY: Positive for seizures.  ALLERGIES: Flagyl which gives her seizures.   MEDICATIONS:  1. Dilantin 100 mg extended release 3 times daily.  2. Depakote 250 mg once a day.   PHYSICAL EXAMINATION:  VITAL SIGNS: Temperature 99.5, pulse on arrival 114 now is 70, respiratory rate 18, blood pressure 116/80, oxygen saturation 98% on room air.   GENERAL: Middle-age lady lying on the gurney, awake, alert, oriented to time, place, and person, in no overt distress.  HEENT: Atraumatic, normocephalic. Pupils equal, reactive to light and accommodation. Extraocular movements intact. Mucous membranes pink, moist. Patient is edentulous.   NECK: Supple. No JV distention.   CHEST: Good air entry. Clear to auscultation.   HEART: Regular rate and rhythm. No murmurs.   ABDOMEN: Full, moves with respiration, nontender. Bowel sounds normoactive. No organomegaly.   EXTREMITIES: No edema, clubbing, deformity.   NEUROLOGICAL: Cranial nerves II through XII grossly intact. Sensory intact. Motor is decreased right lower extremity. Power 3/5 right lower extremity.   LABORATORY, DIAGNOSTIC, AND RADIOLOGICAL DATA: EKG shows sinus  rhythm, rate of 99. CT head showed no acute intracranial abnormality. CBC: White count 5, hemoglobin 13, platelets 202.  Chemistry unremarkable with sodium 143, potassium 3.8, creatinine 0.6, BUN 7, glucose 78, calcium 9.2, magnesium 2.2. Alcohol level 157. AST 69. CK 126. Troponin negative. Dilantin level less than 0.4. Urine drug screen positive for cocaine. Urinalysis is negative.   IMPRESSION:  1. Recurrent seizures with residual Todd's palsy, most likely from noncompliance to medications as her Dilantin level has been low and probably compounded by alcohol intoxication.  2. Alcohol intoxication.  3. Tobacco misuse.  4. 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    PLAN: Admit to general medical floor. Resume outpatient medication. Dilantin loading for now. Place patient on CIWA protocol. Physical therapy in a.m. Nicotine patch offered. Smoking cessation advised. Patient is advised on avoiding alcohol. GI prophylaxis with Protonix. Deep vein thrombosis prophylaxis with Lovenox. Seizure, fall and aspiration precautions. Will ask for social worker evaluation for possible home physical therapy.  TOTAL PATIENT CARE TIME: 50 minutes.   CODE STATUS: FULL CODE.   ____________________________ Floy Sabina. Tilda Franco, MD mia:cms D: 09/27/2011 06:27:03 ET T: 09/27/2011 07:40:52 ET JOB#: [id number] cc: Fabiano Ginley I. Tilda Franco, MD, <Dictator> [facility] Margaret Pyle MD ELECTRONICALLY SIGNED 09/28/2011 1:52

## 2014-05-24 NOTE — Consult Note (Signed)
Referring Physician:  Delorse Limber   Primary Care Physician:  [facility], [address] [place], [place] [address], [place]  Lodema Hong I : [facility], [address], [place], [place] [address], [place] [phone]  Reason for Consult:  Admit Date: 27-Sep-2011   Chief Complaint: Seizure.   Reason for Consult: seizure   History of Present Illness:  History of Present Illness:   PATIENT NAMEMarland Kitchen  [name], [name] [id number] OF BIRTH:  17-Feb-1965 OF ADMISSION:  09/27/2011  PRIMARY CARE PHYSICIAN: [facility] PHYSICIAN: Dr. Pearletha Furl  COMPLAINT: Seizure.  woman with polysubstance and alcohol abuse presented through the ER for seizure activity.  She states that she had a seizure a few days ago and was discharged from the hospital on her Dilantin and Depakote.  However, when she went home she had another seizure and returned to the hospital.  She says that she has had seizures her entire life.  On average, she gets about two seizures per month she believes.  The seizures are always GTC type seizures.  She can feel them coming on due to a sense of "dizziness".  She is unable to remember the seizure and has abrupt LOC during them.  Aftewards, her right leg generally feels weakn.  She says that she has been on depakote and dilantin for a long time but that she had been off of the medications for awhile because she cannot afford them.  In addition, she has been found on admission to test positive for cocaine.  During the interview, she admits to frequent alcohol use.  She says she uses cocaine a few times a month and crack as well from time to time.  She currently only complains of a generalized sense of fatigue.         OF SYSTEMS: 10 point ROS is negative except as mentioned in the HPI. MEDICAL HISTORY:  1. History of seizure disorder. 2. Depression.  Asthma.  Sinusitis.  SURGICAL HISTORY:  1. Cesarean  section. 2. Appendectomy. Back surgery.  HISTORY: Lives with a friend. Drinks about 2 to 4 beers per day. At most 2 to 3 cigarettes per day. Uses marijuana and cocaine regularly.  Occasionally uses crack.  Denies heroin use.   HISTORY: Positive for seizures in multiple family members.  ALLERGIES: Flagyl which gives her seizures.   1. Dilantin 100 mg extended release 3 times daily.  2. Depakote 250 mg once a day.       ROS:   General denies complaints    HEENT no complaints    Lungs no complaints    Cardiac no complaints    GI no complaints    GU no complaints    Musculoskeletal no complaints    Extremities no complaints    Skin no complaints    Neuro no complaints    Endocrine no complaints   Past Medical/Surgical Hx:  Depression:   Scoliosis:   Sinusitis:   Seizures:   Asthma:   C-Section:   Appendectomy:   Back Surgery:   Home Medications: Medication Instructions Last Modified Date/Time  Depakote 250 mg oral delayed release tablet 1 tab(s) orally once a day 23-Aug-13 09:34  Dilantin 100 mg oral capsule, extended release 1 cap(s) orally 3 times a day 23-Aug-13 09:34   Allergies:  Flagyl: Unknown  Vital Signs: **Vital Signs.:   26-Aug-13 05:16   Vital Signs Type Routine   Temperature Temperature (F) 97.8  Celsius 36.5   Temperature Source Oral   Pulse Pulse 82   Respirations Respirations 18   Systolic BP Systolic BP 93   Diastolic BP (mmHg) Diastolic BP (mmHg) 66   Mean BP 75   Pulse Ox % Pulse Ox % 96   Pulse Ox Activity Level  At rest   Oxygen Delivery Room Air/ 21 %    09:42   Vital Signs Type Q 4hr   Temperature Temperature (F) 98.1   Celsius 36.7   Temperature Source Oral   Pulse Pulse 84   Respirations Respirations 18   Systolic BP Systolic BP 92   Diastolic BP (mmHg) Diastolic BP (mmHg) 55   Mean BP 67   Pulse Ox % Pulse Ox % 97   Pulse Ox Activity Level  At rest   Oxygen Delivery Room Air/ 21 %    10:07   Vital Signs Type  Routine   Temperature Temperature (F) 98.7   Celsius 37   Pulse Pulse 99   Respirations Respirations 22   Systolic BP Systolic BP 93   Diastolic BP (mmHg) Diastolic BP (mmHg) 59   Mean BP 70   Pulse Ox % Pulse Ox % 100   Pulse Ox Activity Level  At rest   Oxygen Delivery Room Air/ 21 %    11:29   Temperature Temperature (F) 98.5   Celsius 36.9   Temperature Source Oral   Pulse Pulse 91   Respirations Respirations 20   Systolic BP Systolic BP 829   Diastolic BP (mmHg) Diastolic BP (mmHg) 79   Mean BP 88   Pulse Ox % Pulse Ox % 97   Pulse Ox Activity Level  At rest   Oxygen Delivery Room Air/ 21 %    14:33   Vital Signs Type Routine   Temperature Temperature (F) 98.2   Celsius 36.7   Temperature Source Oral   Pulse Pulse 85   Respirations Respirations 18   Systolic BP Systolic BP 937   Diastolic BP (mmHg) Diastolic BP (mmHg) 74   Mean BP 87   Pulse Ox % Pulse Ox % 97   Pulse Ox Activity Level  At rest   Oxygen Delivery Room Air/ 21 %   Lab Results:  Hepatic:  23-Aug-13 00:16    Bilirubin, Total 0.3   Alkaline Phosphatase 86   SGPT (ALT) 44   SGOT (AST)  69   Total Protein, Serum 8.1   Albumin, Serum 3.8  24-Aug-13 04:48    Bilirubin, Total 0.8   Alkaline Phosphatase 71   SGPT (ALT) 32   SGOT (AST)  41   Total Protein, Serum 6.8   Albumin, Serum  3.3  TDMs:  23-Aug-13 00:16    Dilantin, Serum  < 0.4 (Result(s) reported on 27 Sep 2011 at 12:55AM.)   Valproic Acid, Serum  < 3 (50-100 POTENTIALLY TOXIC:  > 200 mcg/mL)  25-Aug-13 20:36    Dilantin, Serum 13.9 (Result(s) reported on 29 Sep 2011 at 09:04PM.)  Routine Chem:  22-Aug-13 23:36    Result Comment COCAINE - RESULTS VERIFIED BY REPEAT TESTING.  - TPL  Result(s) reported on 27 Sep 2011 at 01:44AM.  23-Aug-13 00:16    Glucose, Serum 78   BUN 7   Creatinine (comp) 0.62   Sodium, Serum 142   Potassium, Serum 3.8   Chloride, Serum  108   CO2, Serum 24   Calcium (Total), Serum 9.2   Anion Gap 10    Osmolality (calc)  280   eGFR (African American) >60   eGFR (Non-African American) >60 (eGFR values <61mL/min/1.73 m2 may be an indication of chronic kidney disease (CKD). Calculated eGFR is useful in patients with stable renal function. The eGFR calculation will not be reliable in acutely ill patients when serum creatinine is changing rapidly. It is not useful in  patients on dialysis. The eGFR calculation may not be applicable to patients at the low and high extremes of body sizes, pregnant women, and vegetarians.)   Ethanol, S. 157   Ethanol % (comp)  0.157 (Result(s) reported on 27 Sep 2011 at 12:55AM.)   Magnesium, Serum 2.2 (1.8-2.4 THERAPEUTIC RANGE: 4-7 mg/dL TOXIC: > 10 mg/dL  -----------------------)  24-Aug-13 04:48    Glucose, Serum 87   BUN  4   Creatinine (comp) 0.68   Sodium, Serum 140   Potassium, Serum 3.6   Chloride, Serum  108   CO2, Serum 28   Calcium (Total), Serum 8.9   Anion Gap  4   Osmolality (calc) 276   eGFR (African American) >60   eGFR (Non-African American) >60 (eGFR values <2mL/min/1.73 m2 may be an indication of chronic kidney disease (CKD). Calculated eGFR is useful in patients with stable renal function. The eGFR calculation will not be reliable in acutely ill patients when serum creatinine is changing rapidly. It is not useful in  patients on dialysis. The eGFR calculation may not be applicable to patients at the low and high extremes of body sizes, pregnant women, and vegetarians.)  25-Aug-13 14:39    Result Comment DIPSTICK - Unable to obtain valid dipstick results  - due to interference of gross blood in the  - specimen.  Result(s) reported on 29 Sep 2011 at 03:21PM.  26-Aug-13 06:20    Glucose, Serum 71   BUN  6   Creatinine (comp) 0.66   Sodium, Serum 142   Potassium, Serum 4.0   Chloride, Serum 106   CO2, Serum 27   Calcium (Total), Serum 8.9   Anion Gap 9   Osmolality (calc) 279   eGFR (African American) >60   eGFR  (Non-African American) >60 (eGFR values <70mL/min/1.73 m2 may be an indication of chronic kidney disease (CKD). Calculated eGFR is useful in patients with stable renal function. The eGFR calculation will not be reliable in acutely ill patients when serum creatinine is changing rapidly. It is not useful in  patients on dialysis. The eGFR calculation may not be applicable to patients at the low and high extremes of body sizes, pregnant women, and vegetarians.)  Urine Drugs:  91-PHX-50 56:97    Tricyclic Antidepressant, Ur Qual (comp) NEGATIVE (Result(s) reported on 27 Sep 2011 at 01:44AM.)   Amphetamines, Urine Qual. NEGATIVE   MDMA, Urine Qual. NEGATIVE   Cocaine Metabolite, Urine Qual. POSITIVE   Opiate, Urine qual NEGATIVE   Phencyclidine, Urine Qual. NEGATIVE   Cannabinoid, Urine Qual. NEGATIVE   Barbiturates, Urine Qual. NEGATIVE   Benzodiazepine, Urine Qual. NEGATIVE (----------------- The URINE DRUG SCREEN provides only a preliminary, unconfirmed analytical test result and should not be used for non-medical  purposes.  Clinical consideration and professional judgment should be  applied to any positive drug screen result due to possible interfering substances.  A more specific alternate chemical method must be used in order to obtain a confirmed analytical result.  Gas chromatography/mass spectrometry (GC/MS) is the preferred confirmatory method.)   Methadone, Urine Qual. NEGATIVE  Cardiac:  23-Aug-13 00:16    Troponin I < 0.02 (0.00-0.05 0.05  ng/mL or less: NEGATIVE  Repeat testing in 3-6 hrs  if clinically indicated. >0.05 ng/mL: POTENTIAL  MYOCARDIAL INJURY. Repeat  testing in 3-6 hrs if  clinically indicated. NOTE: An increase or decrease  of 30% or more on serial  testing suggests a  clinically important change)   CK, Total 126   CPK-MB, Serum 0.7 (Result(s) reported on 27 Sep 2011 at 12:55AM.)    08:15    Troponin I < 0.02 (0.00-0.05 0.05 ng/mL or less:  NEGATIVE  Repeat testing in 3-6 hrs  if clinically indicated. >0.05 ng/mL: POTENTIAL  MYOCARDIAL INJURY. Repeat  testing in 3-6 hrs if  clinically indicated. NOTE: An increase or decrease  of 30% or more on serial  testing suggests a  clinically important change)    17:20    Troponin I < 0.02 (0.00-0.05 0.05 ng/mL or less: NEGATIVE  Repeat testing in 3-6 hrs  if clinically indicated. >0.05 ng/mL: POTENTIAL  MYOCARDIAL INJURY. Repeat  testing in 3-6 hrs if  clinically indicated. NOTE: An increase or decrease  of 30% or more on serial  testing suggests a  clinically important change)  Routine UA:  22-Aug-13 23:36    Color (UA) Straw   Clarity (UA) Hazy   Glucose (UA) Negative   Bilirubin (UA) Negative   Ketones (UA) Negative   Specific Gravity (UA) 1.001   Blood (UA) Negative   pH (UA) 5.0   Protein (UA) Negative   Nitrite (UA) Negative   Leukocyte Esterase (UA) Negative (Result(s) reported on 27 Sep 2011 at 12:05AM.)   RBC (UA) <1 /HPF   WBC (UA) 1 /HPF   Bacteria (UA) 1+   Epithelial Cells (UA) 1 /HPF (Result(s) reported on 27 Sep 2011 at 12:05AM.)  25-Aug-13 14:39    Color (UA) RED   Clarity (UA) CLOUDY   Glucose (UA) see comment   Bilirubin (UA) see comment   Ketones (UA) see comment   Specific Gravity (UA) 1.011   Blood (UA) see comment   pH (UA) see comment   Protein (UA) see comment   Nitrite (UA) SEE COMMENT   Leukocyte Esterase (UA) see comment   RBC (UA) 3682 /HPF   WBC (UA) 751 /HPF   Bacteria (UA) NONE SEEN   Epithelial Cells (UA) NONE SEEN   Mucous (UA) PRESENT (Result(s) reported on 29 Sep 2011 at 03:21PM.)  Routine Coag:  24-Aug-13 04:48    Prothrombin 12.2   INR 0.9 (INR reference interval applies to patients on anticoagulant therapy. A single INR therapeutic range for coumarins is not optimal for all indications; however, the suggested range for most indications is 2.0 - 3.0. Exceptions to the INR Reference Range may include: Prosthetic  heart valves, acute myocardial infarction, prevention of myocardial infarction, and combinations of aspirin and anticoagulant. The need for a higher or lower target INR must be assessed individually. Reference: The Pharmacology and Management of the Vitamin K  antagonists: the seventh ACCP Conference on Antithrombotic and Thrombolytic Therapy. OJJKK.9381 Sept:126 (3suppl): [id number]. A HCT value >55% may artifactually increase the PT.  In one study,  the increase was an average of 25%. Reference:  "Effect on Routine and Special Coagulation Testing Values of Citrate Anticoagulant Adjustment in Patients with High HCT Values." American Journal of Clinical Pathology 2006;126:400-405.)  Routine Hem:  23-Aug-13 00:16    WBC (CBC) 5.6   RBC (CBC) 4.27   Hemoglobin (CBC) 13.7   Hematocrit (CBC) 40.3   Platelet Count (CBC) 202 (Result(s) reported on 27 Sep 2011 at 12:39AM.)   MCV 94   MCH 32.2   MCHC 34.1   RDW 14.2  24-Aug-13 04:48    WBC (CBC) 3.9   RBC (CBC) 3.89   Hemoglobin (CBC) 12.4   Hematocrit (CBC) 36.5   Platelet Count (CBC) 183   MCV 94   MCH 31.7   MCHC 33.8   RDW 14.0   Neutrophil % 49.7   Lymphocyte % 36.7   Monocyte % 11.6   Eosinophil % 1.4   Basophil % 0.6   Neutrophil # 1.9   Lymphocyte # 1.4   Monocyte # 0.5   Eosinophil # 0.1   Basophil # 0.0 (Result(s) reported on 28 Sep 2011 at 05:50AM.)  26-Aug-13 06:20    WBC (CBC) 4.9   RBC (CBC) 3.84   Hemoglobin (CBC) 12.3   Hematocrit (CBC) 36.3   Platelet Count (CBC) 168   MCV 95   MCH 32.0   MCHC 33.8   RDW 13.9   Neutrophil % 42.2   Lymphocyte % 44.3   Monocyte % 10.8   Eosinophil % 2.1   Basophil % 0.6   Neutrophil # 2.1   Lymphocyte # 2.2   Monocyte # 0.5   Eosinophil # 0.1   Basophil # 0.0 (Result(s) reported on 30 Sep 2011 at 07:30AM.)   Radiology Results: CT:    23-Aug-13 00:44, CT Head Without Contrast   CT Head Without Contrast    REASON FOR EXAM:    sz weakness right  COMMENTS:        PROCEDURE: CT  - CT HEAD WITHOUT CONTRAST  - Sep 27 2011 12:44AM     RESULT: Comparison:  08/23/2010, 07/17/2008    Technique: Multiple axial images from the foramen magnum to the vertex   were obtained without IV contrast.    Findings:      There is no evidence of mass effect, midline shift, or extra-axial fluid   collections.  There is no evidence of a space-occupying lesion or   intracranial hemorrhage. There is no evidence of a cortical-based area of     acute infarction. There is a nonspecific area of subcortical white matter   low attenuation in the left frontal lobe similar in appearance to   07/17/2008.    The ventricles and sulci are appropriate for the patient's age. The basal   cisterns are patent.    Visualized portions of the orbits are unremarkable. The visualized   portions of the paranasal sinuses and mastoid air cells are unremarkable.     The osseous structures are unremarkable.    IMPRESSION:      No acute intracranialprocess.  A small area of subcortical white matter low attenuation in the left   frontal lobe unchanged compared with 07/17/2008. Differential   considerations include of microangiopathy, vasculitis versus   demyelinating process. Given the patient's history of seizures, further   evaluation with MRI with intravenous contrast may be helpful.    Dictation Site: 1          Verified By: Jennette Banker, M.D., MD   Radiology Impression:  Radiology Impression: HCT shows left frontal lobe subcortical white matter lesion unchanged from 6/10.   Impression/Recommendations:  Recommendations:   EYES:exam of optic discs shows normal size, appearance and C/D ratio. and S2 sounds are within normal limits, without murmurs, gallops, or rubs. - Thin- Normal- Patient declines gait testing at this time due to eating meal.    Shoulder abduction (deltoid/supraspinatus,  axillary n, C5)   Elbow flexion (biceps brachii, musculoskeletal n, C5-6)   Elbow  extension (triceps, radial n, C7)   Finger adduction (interossei, ulnar n, T1) 4/5    Hip flexion (iliopsoas, L1/L2) 4/5    Knee flexion (hamstrings, sciatic n, L5/S1)  4/5    Knee extension (quadriceps, femoral n, L3/4)   Ankle dorsiflexion (tibialis anterior, deep fibular n, L4/5)   Ankle plantarflexion (gastroc, tibial n, S1)  STATUS:is oriented to person, place and time.  Recent and remote memory are within normal limits.  Attention span and concentration are slightly reduced.  Naming, repetition, comprehension and expressive speech are within normal limits.  Patient's fund of knowledge is within normal limits for educational level. NERVES:   CN II (normal visual acuity and visual fields)   CN III, IV, VI (extraocular muscles are intact)   CN V (facial sensation is intact bilaterally)   CN VII (facial strength is intact bilaterally)   CN VIII (hearing is intact bilaterally)   CN IX/X (palate elevates midline, normal phonation)   CN XI (shoulder shrug strength is normal and symmetric)   CN XII (tongue protrudes midline) to pain and temp bilaterally (spinothalamic tracts)to position and vibration bilaterally (dorsal columns)    Biceps   Brachioradialis   Patellar   Achilles to nose testing is within normal limits.  AND RECS: 49 year old woman with polysubstance and alcohol abuse presented through the ER for seizure activity.    Overall, this patient continues to be at high risk for continued seizures.  She has a subcortical lesion on HCT.  Based on this, she should have an MRI of the brain ideally with and without contrast to further characterize this lesion.  Also, she will require a routine EEG to evaluate for interictal activity to try to identify a source location for her seizures.  Given that she can feel her seizures coming on, I suspect a complex partial seizure likely with rapid secondary generalization.  The prominent motor components to her seizure could reflect a frontal origin to the  seizures.  I have advised that she not drive a car or do any activity that requries constant attention for her safety and for the safety of others.  I have advised that she quit using any and all drugs of abuse such as alcohol, marijuana and cocaine as each of these medications can independently lower the seizure threshold and increase her risk for more seizures.  Additionally, I have reinfornced that she needs to work closely with her [facility] to obtain her anticonvulsant medications and take them regularly.  Ideally, she should change to Keppra 750 mg BID as the dilantin and depakote levels can be affected by her drug use since they are hepatically metabolized.  However, she should only be prescribed medication that she can reasonably get access to upon discharge likely via a local charity or state program.  Electronic Signatures: Anabel Bene (MD)  (Signed 26-Aug-13 19:57)  Authored: REFERRING PHYSICIAN, Primary Care Physician, Consult, History of Present Illness, Review of Systems, PAST MEDICAL/SURGICAL HISTORY, HOME MEDICATIONS, ALLERGIES, NURSING VITAL SIGNS, LAB RESULTS, RADIOLOGY RESULTS, Recommendations   Last Updated: 26-Aug-13 19:57 by Anabel Bene (MD)

## 2014-05-24 NOTE — Discharge Summary (Signed)
PATIENT NAME:  [name], [name] MR#:  [id number] DATE OF BIRTH:  Sep 16, 1965  DATE OF ADMISSION:  09/29/2011 DATE OF DISCHARGE:  10/05/2011  PRESENTING COMPLAINT: Seizures.  DISCHARGE DIAGNOSES:  1. Acute on chronic seizures secondary to medical noncompliance and polysubstance abuse.  2. Chronic alcohol abuse.  3. Tobacco abuse.  4. Asthma, stable.   CONDITION ON DISCHARGE: Fair.  CODE STATUS: FULL CODE.   MEDICATIONS:  1. Depakote 250 mg extended release p.o. daily.  2. Phenytoin extended release 200 mg one capsule twice [name] day. 3. Keppra 750 mg p.o. twice [name] day.   4. Ibuprofen 400 mg one tablet every six hours as needed.   DIET: Regular.   DISCHARGE FOLLOWUP: The patient is [facility]. She was also given information to follow up with [facility] for her medications.   CONDITION ON DISCHARGE: Her condition is fair.   LABORATORY, DIAGNOSTIC AND RADIOLOGIC DATA: Creatinine is 0.69.   Urinalysis is negative for urinary tract infection.   Urine culture is negative.   Chest x-ray shows possible atelectasis in left lower lobe.   CBC within normal limits. Basic metabolic panel within normal limits.   CT of the abdomen shows no acute abnormality.   Cardiac enzymes were negative on admission. Serum ethanol level was 0.157. Valproic was less than 3.  At admission EKG showed normal sinus rhythm.   Urine drug screen was positive for cocaine on admission.  EEG done showed no evidence of seizure activity. EEG was normal.   CONSULTANTS: Theora MasterZachary Potter, MD - Neurology.  BRIEF SUMMARY OF HOSPITAL COURSE:  1. [name] is [name] 49 year old African American female well known to our service from previous multiple admissions who comes to the Emergency Room with seizures. She was admitted with recurrent seizures which appears due to medical noncompliance and polysubstance abuse including alcohol abuse. The patient's Depakote and Dilantin levels were subtherapeutic  at admission. The patient had been noncompliant with her medications. She was resumed back on her Dilantin and Depakote. MRI of the brain remained negative and EEG [facility] was negative for any seizure activity. Keppra was added during this admission. The patient did have several seizures during the hospital stay, however, prior to discharge, she did not have any seizure activity for more than 48 hours. At one point it was thought the patient will need to have continuous EEG monitoring to capture the seizure activity; however, this was not required since the patient remained seizure-free more than 48 hours prior to discharge. The patient was recommended to [facility] who can help out with her medications as well and information by care management was given regarding [facility] for her medications. Her prescription was filled up by care management.  2. Alcoholic intoxication. She was kept on CIWA protocol. She is recommended to stop drinking and she did not have signs or symptoms of withdrawal.  3. Tobacco abuse. The patient was advised on smoking cessation.  4. Polysubstance abuse with urine drug screen positive for cocaine. Again, the patient was advised to stay away from street drugs since these could be contributing to her seizure activity.  5. Her asthma remained stable.   Her hospital stay otherwise remained stable.   TIME SPENT: 40 minutes. ____________________________ Wylie HailSona [name]. Allena KatzPatel, MD sap:slb D: 10/06/2011 07:40:31 ET T: 10/08/2011 12:07:24 ET JOB#: [id number]  cc: Lonnel Gjerde [name]. Allena KatzPatel, MD, <Dictator> Willow OraSONA [name] Breken Nazari MD ELECTRONICALLY SIGNED 10/08/2011 15:46

## 2014-05-24 NOTE — Consult Note (Signed)
Brief Urology Consultation Report: for Consultation: Gross Hematuria MD: Dr. Nichola SizerVachhaniMD: Marin OlpJay H. Daran Favaro, M.D. [facility]  Gross Hematuria - developed after the pt pulled on her foley catheter last night during a seizure. Pt denies prior h/o gross hematuria or any prior lower urinary tract sx's. Most likely due to minor mucosal trauma - expect gradual resolution over the next several weeks. Right Flank Pain/marked CVAT - developed after the above described foley trauma. This may be due to ureteral obstruction +/- right pyelonephritis. Pt denies any h/o urolithiasis. Pt does report subjective fever/sweats for the past week with fatigue/weakness (but denies dysuria, freq/urgency prior to foley trauma, however, since the foley trauma, the pt reports urinary freq q1hr with sense of incomplete emptying and initial dysuria).  pt also has a h/o ?DDD being [facility] - ? radiculopathy; could this also be a contributing factor to the pt's right hip pain/RLE numbness/weakness?  UC&SCT Abd/Pelvis without and with IV contrastConsider Ortho ConsultationBladder Scan (to r/o urinary retention) - replace foley if PVR >14950mL the CT is negative for urologic pathology, the pt should f/u with Dr. Rosezetta SchlatterJohn Harman [facility], [phone]) as an outpatient.the CT demonstrates any urologic pathology, Dr. Irineo AxonScott Stoioff, should be contacted tomorrow. you for the opportunity to participate in the care of your patients.  Electronic Signatures: Marin OlpKim, Meghana Tullo H (MD)  (Signed on 25-Aug-13 20:03)  Authored  Last Updated: 25-Aug-13 20:03 by Marin OlpKim, Khalani Novoa H (MD)

## 2014-05-27 NOTE — H&P (Signed)
PATIENT NAME:  [name], [name] MR#:  [id number] DATE OF BIRTH:  1965-02-23  DATE OF ADMISSION:  02/01/2013  PRIMARY CARE PHYSICIAN:  Nonlocal.  REFERRING PHYSICIAN:  Rebecca L. Lord, MD  CHIEF COMPLAINT: Seizure today with left-sided weakness, numbness and tingling.   HISTORY OF PRESENT ILLNESS: A 49 year old African American female with a history of seizure disorder, depression, asthma, sinusitis, substance abuse, noncompliance who presented to the ED with the above chief complaint. The patient is alert, awake, oriented, in no acute distress. According to the patient and Dr. Shaune Pollack, the patient had 1 episode of seizure early this morning with loss of consciousness but the patient cannot remember what happened. The patient was sent to ED for further evaluation, developed another episode of seizure. The patient's Dilantin level was low, was treated with IV Dilantin. The patient is alert, awake after seizure episode. The patient complains of left-sided weakness, numbness, tingling. The patient also has a headache, but no dizziness, fever or chills. No chest pain, palpitation. No other symptoms. Denies any dysuria, hematuria.   PAST MEDICAL HISTORY: Substance abuse, noncompliance, seizure disorder, depression, asthma, sinusitis, tobacco abuse, alcohol abuse. Patient has not been taking any medications for several months.   PAST SURGICAL HISTORY: C-section, appendectomy, back surgery.   SOCIAL HISTORY: The patient smokes half pack a day for 30 years, drinks alcohol 3 days a week, 6 cans of beer. Used cocaine last night.   FAMILY HISTORY: Seizure.   REVIEW OF SYSTEMS: CONSTITUTIONAL: The patient denies any fever or chills but has headache. No dizziness but has weakness on the left side.  EYES: No double vision or blurry vision.  ENT: No postnasal drip, slurred speech or dysphagia.  CARDIOVASCULAR: No chest pain, palpitation, orthopnea or nocturnal dyspnea. No leg edema.  PULMONARY: No cough, sputum,  shortness of breath or hemoptysis.  GASTROINTESTINAL: No abdominal pain, nausea, vomiting, diarrhea. No melena or bloody stool.  GENITOURINARY: No dysuria, hematuria or incontinence.  SKIN: No rash or jaundice.  NEUROLOGY: Positive for seizure and loss of consciousness.  HEMATOLOGY: No easy bruising or bleeding.  ENDOCRINOLOGY: No polyuria or polydipsia, heat or cold intolerance.   ALLERGIES: FLAGYL.   MEDICATION:  None.  PHYSICAL EXAMINATION:  VITAL SIGNS:  Blood pressure 136/68, pulse 106, O2 saturation 99% on room air.  GENERAL: The patient is alert, awake, oriented, in no acute distress.  HEENT: Pupils round, equal, reactive to light and accommodation. Moist oral mucosa. Clear oropharynx.  NECK: Supple. No JVD or carotid bruits. No lymphadenopathy. No thyromegaly.  CARDIOVASCULAR: S1, S2 regular rate, rhythm. No murmurs, gallop.  PULMONARY: Bilateral air entry.  No wheezing or rales. No use of accessory muscle to breathe.  ABDOMEN: Soft. No distention. No tenderness. No organomegaly. Bowel sounds present.  EXTREMITIES: No edema, clubbing or cyanosis. No calf tenderness. Bilateral pedal pulses present.  SKIN: No rash or jaundice.  NEUROLOGIC: A and O x 3. Left-sided weakness about 4 out of 5, right side 5 out of 5. Sensation intact. DTRs 2+.   LABORATORY, DIAGNOSTIC AND RADIOLOGICAL DATA:  1.  CAT scan of head did not show any acute intracranial abnormality.  2.  CBC in normal range.  3.  Glucose 87, BUN 6, creatinine 0.63, electrolytes normal.  4.  Dilantin level 1.9.  5.  Urinalysis showed WBC 17, RBC 57, nitrate negative.  6.  Urine drug screen showed cocaine positive.  7.  EKG shows sinus rhythm with PVCs and fusion complexes at 86 BPM.   IMPRESSIONS: 1.  Seizure activity, possibly due to noncompliance and cocaine abuse.  2.  Left-sided weakness, numbness and tingling.  Need to rule out cerebrovascular accident.  3.  Urinary tract infection.  4.  Cocaine abuse.  5.   Tobacco abuse.  6.  Noncompliance.  7.  History of asthma and depression.  8. Alcohoh abuse  PLAN OF TREATMENT: 1.  The patient will be admitted to the medical floor.  2.  We will start seizure, fall, and aspiration precautions, neuro checks. Ativan prn.  Will get an MRI of brain and echocardiograph, carotid duplex for CVA workup and we will get a neurology consult.  3.  We will start aspirin, statin.  4.  I reviewed the patient's previous history and medication. We will start Dilantin, Keppra and Depakote as prescribed in the past and follow up neurology.  5.  For UTI, we will start Rocephin, follow up urine culture.  6.  Smoking cessation was counseled for 4 minutes.  7. CIWA protocol.  I discussed the patient's condition and plan of treatment with the patient.    TIME SPENT: About 62 minutes.     ____________________________ Shaune Pollack, MD qc:cs D: 02/01/2013 14:02:00 ET T: 02/01/2013 14:36:50 ET JOB#: [id number]  cc: Shaune Pollack, MD, <Dictator> Shaune Pollack MD ELECTRONICALLY SIGNED 02/01/2013 21:09

## 2014-05-28 NOTE — Discharge Summary (Signed)
PATIENT NAME:  [name], [name] MR#:  [id number] DATE OF BIRTH:  Jun 06, 1965  DATE OF ADMISSION:  02/01/2013 DATE OF DISCHARGE:  02/04/2013  DICTATING HOSPITALIST: Fredda Clarida S. Sherryll Burger, MD  DISCHARGE DIAGNOSES:  1.  Seizure, likely due to noncompliance and cocaine abuse. Recommended Dilantin at this time.  2.  Left-sided weakness, numbness and tingling which has resolved. Cerebrovascular accident is ruled out. She is walking well with physical therapy.  3.  Tobacco substance and cocaine abuse. Counseled although not sure how long she will be able to stay off this.   SECONDARY DIAGNOSES:  1.  Substance abuse, noncompliance.  2.  Seizure disorder.  3.  Depression.  4.  Asthma. 5.  Sinusitis.   CONSULTATIONS:  1.  Neurology, Dr. Loretha Brasil.  2.  Physical therapy.   PROCEDURES AND RADIOLOGY:  1.  CT scan of the head without contrast on the 29th of December showed no acute intracranial abnormality.  2.  MRI of the brain without contrast on the 29th of December showed no evidence of acute intracranial disease. White matter disease.  3.  Bilateral carotid Dopplers on the 29th of December showed no hemodynamically significant stenosis.  4.  A 2-D echocardiogram on the 29th of December showed normal LVEF with the value of 60% to 65%. Normal global LV systolic function.  5.  Major laboratory panel: Urinalysis on admission was negative.  6.  Urine culture on admission showed 17 WBCs;  otherwise, negative.  7.  Urine culture was negative.  8.  Serum Dilantin level was 1.6.   HISTORY AND SHORT HOSPITAL COURSE: The patient is a 49 year old female with the above-mentioned medical problems who was admitted for seizure activity with left-sided numbness, tingling and weakness. Please see Dr. Nicky Pugh dictated history and physical for further details. Neurology consultation was obtained with Dr. Loretha Brasil who recommended continuing her on Dilantin only due to noncompliance issue and substance abuse history along  with financial difficulties.   The patient also had some issues of agitation while here in the hospital and required some Geodon at times. There was some concern of family member/friend bringing cocaine here in the hospital for her as she had some weird mental status presentation unexplained by any obvious medical etiology. The patient was feeling much better on the 1st of January and was close to her baseline and she is being discharged home in stable condition.   DISCHARGE PHYSICAL EXAMINATION:  VITAL SIGNS: Temperature 98, heart rate 100 per minute, respirations 18 per minute, blood pressure 106/72 mmHg and she is saturating 97% on room air.  CARDIOVASCULAR: S1, S2 normal. No murmurs, rubs gallop.  LUNGS: Clear to auscultation bilaterally. No wheezing, rales, rhonchi, or crepitation.  ABDOMEN: Soft, benign.  NEUROLOGIC: Nonfocal examination.   All other physical examination remained at baseline.   DISCHARGE MEDICATIONS:  1.  Dilantin 100 mg p.o. 3 times a day, which she certainly could take all 3 tablets at one time and that should be okay.  2.  Flonase 2 sprays intranasally daily.   DISCHARGE DIET: Regular.   DISCHARGE ACTIVITY: As tolerated.   DISCHARGE INSTRUCTIONS AND FOLLOWUP: The patient was instructed to follow up with her primary care physician at [facility] in 1 to 2 weeks. She was provided a script for rolling walker.   TOTAL TIME DISCHARGING THIS PATIENT: 45 minutes.  PLEASE NOTE: This patient remains at very high risk for recurrent hospitalization due to medication noncompliance and substance abuse history.   ____________________________ Ellamae Sia. Sherryll Burger,  MD vss:np D: 02/04/2013 14:27:04 ET T: 02/04/2013 16:07:44 ET JOB#: [id number]  cc: Azarias Chiou S. Sherryll Burger, MD, <Dictator> [facility] [name], MD Ellamae Sia [facility] MD ELECTRONICALLY SIGNED 02/09/2013 10:47

## 2014-05-28 NOTE — Consult Note (Signed)
PATIENT NAME:  [name], [name] MR#:  [id number] DATE OF BIRTH:  17-Aug-1965  DATE OF CONSULTATION:  02/02/2013  REFERRING PHYSICIAN:   CONSULTING PHYSICIAN:  [name], MD  REASON FOR CONSULTATION: Seizure activity.   HISTORY OF PRESENT ILLNESS: This is a 49 year old female with past medical history of seizure disorder, depression, asthma, arthritis, substance abuse, and noncompliance with medications presenting to the Emergency Department status post seizure activity. At home the patient is supposed to be on Dilantin. States she ran out of Dilantin because she could not afford it. She also stated she had left-sided weakness, numbness, and tingling status post stroke work-up, CAT scan, and carotid ultrasound that did not show any acute intracranial abnormality.   PAST MEDICAL HISTORY: Substance abuse, noncompliance, seizure disorder, depression, asthma, sinusitis.  MEDICATIONS: The patient has not taken any medication for several months.   PAST SURGICAL HISTORY: C-section.  SOCIAL HISTORY:  The patient is a smoker daily, daily drinker, and cocaine user.   REVIEW OF SYSTEMS:  CONSTITUTIONAL: Denies any fever, chills. EYES: No double vision. ENT: No postnasal drip.  CARDIOVASCULAR: No chest pain, palpitation, orthopnea.  PULMONARY: No cough. No sputum. No shortness of breath.  GENITOURINARY: No dysuria, hematuria, or incontinence.  NEUROLOGIC: Positive history of seizures.   LABORATORY AND DIAGNOSTICS: Work-up included Dilantin level of 1.9, BUN 7, creatinine 0.54, potassium 3.9, sodium 138, glucose was 102.   Imaging as described above.   PHYSICAL EXAMINATION: VITAL SIGNS: Include a temperature of 98.1, pulse 95, respirations 16, blood pressure 112/88, and pulse ox 98%.  NEUROLOGIC EVALUATION: The patient is alert, awake, and oriented x3, able to tell me place, location, and the reason why she is in the hospital. Speech appears to be fluent. Cranial nerve examination: Pupils 3 mm  to 2 mm, reactive bilaterally. Facial sensation intact. Facial motor is intact. Tongue is midline. Uvula elevates symmetrically. Shoulder shrug intact. Motor strength 5/5 in the right upper extremity, right lower extremity. In left upper extremity there is a drift, but I believe some of it is non-physiologic as the patient's strength is improved when persuade. Left lower extremity is 5- out of 5. Reflexes are diminished throughout. Coordination intact. Gait could not be assessed. Sensation intact to light touch and temperature.   IMPRESSION: A 49 year old female with past medical history of polysubstance abuse, depression, and history of seizures. She was supposed been on Dilantin. She stopped because she states that she ran out of money for the past few months, admitted with seizure activity. Urine tox is positive for cocaine, which lowers the seizure threshold. The patient is also found to have a urinary tract infection which lowers seizure threshold as well.   PLAN: She was started on 3 antiepileptics, which I do not think are necessary. I would discontinue the Keppra, which is the most expensive, which she cannot afford. She is on Depakote 250 mg daily here, which is too small of a dose for seizure activity. Would discontinue that. If there is history of bipolar or mood disorder, she would need Depakote, but that dose would be 500 mg twice a day. I would just leave her on Dilantin 100 mg 3 times a day, check a level in 2 to 3 days when it goes through its half lives and this should be the only medication that she is on. I do not suspect that she will be compliant with it as she states she runs out of medications. Would consider social worker case management just for assistance  in obtaining the medications. No further neurological imaging at this point. I do not think she needs an EEG. Thank you. Please call with any questions.  ____________________________ [name], MD yz:sb D: 02/02/2013 13:46:19  ET T: 02/02/2013 14:25:27 ET JOB#: [id number]  cc: [name], MD, <Dictator> [name] MD ELECTRONICALLY SIGNED 02/05/2013 14:51

## 2014-07-06 ENCOUNTER — Emergency Department: Payer: Self-pay

## 2014-07-06 ENCOUNTER — Emergency Department
Admission: EM | Admit: 2014-07-06 | Discharge: 2014-07-06 | Disposition: A | Discharge status: Home / Self Care | Payer: Self-pay | Attending: Emergency Medicine | Admitting: Emergency Medicine

## 2014-07-06 ENCOUNTER — Encounter: Payer: Self-pay | Admitting: Emergency Medicine

## 2014-07-06 DIAGNOSIS — Z720 Tobacco use: Secondary | ICD-10-CM | POA: Insufficient documentation

## 2014-07-06 DIAGNOSIS — S9031XA Contusion of right foot, initial encounter: Secondary | ICD-10-CM | POA: Insufficient documentation

## 2014-07-06 DIAGNOSIS — W2209XA Striking against other stationary object, initial encounter: Secondary | ICD-10-CM | POA: Insufficient documentation

## 2014-07-06 DIAGNOSIS — Y998 Other external cause status: Secondary | ICD-10-CM | POA: Insufficient documentation

## 2014-07-06 DIAGNOSIS — Z79891 Long term (current) use of opiate analgesic: Secondary | ICD-10-CM | POA: Insufficient documentation

## 2014-07-06 DIAGNOSIS — Y9289 Other specified places as the place of occurrence of the external cause: Secondary | ICD-10-CM | POA: Insufficient documentation

## 2014-07-06 DIAGNOSIS — Y9389 Activity, other specified: Secondary | ICD-10-CM | POA: Insufficient documentation

## 2014-07-06 MED ORDER — HYDROCODONE-ACETAMINOPHEN 5-325 MG PO TABS
ORAL_TABLET | ORAL | Status: AC
  Filled 2014-07-06: qty 2

## 2014-07-06 MED ORDER — HYDROCODONE-ACETAMINOPHEN 5-325 MG PO TABS
2.0000 | ORAL_TABLET | Freq: Once | ORAL | Status: AC
  Administered 2014-07-06: 2 via ORAL

## 2014-07-06 MED ORDER — OXYCODONE-ACETAMINOPHEN 5-325 MG PO TABS: 1.0000 | ORAL_TABLET | ORAL | Status: DC | PRN

## 2014-07-06 MED ORDER — KETOROLAC TROMETHAMINE 30 MG/ML IJ SOLN
INTRAMUSCULAR | Status: AC
  Filled 2014-07-06: qty 1

## 2014-07-06 MED ORDER — IBUPROFEN 800 MG PO TABS: 800.0000 mg | ORAL_TABLET | Freq: Three times a day (TID) | ORAL | Status: DC | PRN

## 2014-07-06 MED ORDER — KETOROLAC TROMETHAMINE 30 MG/ML IJ SOLN
60.0000 mg | Freq: Once | INTRAMUSCULAR | Status: AC
  Administered 2014-07-06: 60 mg via INTRAMUSCULAR

## 2014-07-06 NOTE — Discharge Instructions (Signed)

## 2014-07-06 NOTE — ED Notes (Signed)
Wooden shoe fitted to right foot

## 2014-07-06 NOTE — ED Notes (Signed)
Pt states that while she was getting out of the tub yesterday , her left foot got tangled in the rug and she broke her 4th and 5th toe. She states that she is in a lot of pain and she has already taken tylenol for it and it doesn't work.

## 2014-07-06 NOTE — ED Notes (Signed)
States she hit her right 5 th toe yesterday  Unable to bear wt

## 2014-07-06 NOTE — ED Provider Notes (Signed)
[facility] Emergency Department Provider Note  ____________________________________________  Time seen: Approximately 5:18 PM  I have reviewed the triage vital signs and the nursing notes.   HISTORY  Chief Complaint Toe Injury    HPI [name] is a 49 y.o. female presents to the emergency room with complaints of right foot pain. Patient states that she kicked her toe on a carpet last night and thinks it broke. Complains of excruciating pain. Is localized to the right fourth and fifth digit at the PIP joints.  History reviewed. No pertinent past medical history.  There are no active problems to display for this patient.   History reviewed. No pertinent past surgical history.  Current Outpatient Rx  Name  Route  Sig  Dispense  Refill  . ibuprofen (ADVIL,MOTRIN) 800 MG tablet   Oral   Take 1 tablet (800 mg total) by mouth every 8 (eight) hours as needed.   30 tablet   0   . oxyCODONE-acetaminophen (ROXICET) 5-325 MG per tablet   Oral   Take 1-2 tablets by mouth every 4 (four) hours as needed for severe pain.   15 tablet   0     Allergies Review of patient's allergies indicates no known allergies.  History reviewed. No pertinent family history.  Social History History  Substance Use Topics  . Smoking status: Current Every Day Smoker  . Smokeless tobacco: Not on file  . Alcohol Use: No    Review of Systems Constitutional: No fever/chills Eyes: No visual changes. ENT: No sore throat. Cardiovascular: Denies chest pain. Respiratory: Denies shortness of breath. Gastrointestinal: No abdominal pain.  No nausea, no vomiting.  No diarrhea.  No constipation. Genitourinary: Negative for dysuria. Musculoskeletal: Negative for back pain. Positive for right foot and toe pain Skin: Negative for rash. Neurological: Negative for headaches, focal weakness or numbness.  10-point ROS otherwise  negative.  ____________________________________________   PHYSICAL EXAM:  VITAL SIGNS: ED Triage Vitals  Enc Vitals Group     BP 07/06/14 1616 116/87 mmHg     Pulse Rate 07/06/14 1616 95     Resp 07/06/14 1616 20     Temp 07/06/14 1616 99.2 F (37.3 C)     Temp Source 07/06/14 1616 Oral     SpO2 07/06/14 1616 97 %     Weight 07/06/14 1616 98 lb (44.453 kg)     Height 07/06/14 1616 5' (1.524 m)     Head Cir --      Peak Flow --      Pain Score 07/06/14 1617 7     Pain Loc --      Pain Edu? --      Excl. in GC? --     Constitutional: Alert and oriented. Well appearing and in no acute distress. Musculoskeletal: Right fourth and fifth digit swelling tender. No obvious ecchymosis Neurologic:  Normal speech and language. No gross focal neurologic deficits are appreciated. Speech is normal. Gait not tested due to pain Skin:  Skin is warm, dry and intact. No rash noted. Psychiatric: Mood and affect are normal. Speech and behavior are normal.  ____________________________________________   LABS (all labs ordered are listed, but only abnormal results are displayed)  Labs Reviewed - No data to display ____________________________________________  EKG  Not applicable ____________________________________________  RADIOLOGY  Negative for fracture ____________________________________________   PROCEDURES  Procedure(s) performed: None  Critical Care performed: No  ____________________________________________   INITIAL IMPRESSION / ASSESSMENT AND PLAN / ED COURSE  Pertinent labs & imaging results that were available during my care of the patient were reviewed by me and considered in my medical decision making (see chart for details).  Diagnosed with a right foot contusion. Negative fracture. Will prescribe Motrin 800 Percocet for 2 days and open toed shoe to wear for comfort. Patient to return to ER if symptoms worsen. No other emergency medical complaints at this  time. ____________________________________________   FINAL CLINICAL IMPRESSION(S) / ED DIAGNOSES  Final diagnoses:  Foot contusion, right, initial encounter     Evangeline Dakin, PA-C 07/06/14 1803  Sharyn Creamer, MD 07/07/14 [phone]

## 2014-08-21 ENCOUNTER — Emergency Department: Payer: Self-pay

## 2014-08-21 ENCOUNTER — Observation Stay
Admission: EM | Admit: 2014-08-21 | Discharge: 2014-08-23 | Disposition: A | Discharge status: Home / Self Care | Payer: Self-pay | Attending: Internal Medicine | Admitting: Internal Medicine

## 2014-08-21 ENCOUNTER — Encounter: Payer: Self-pay | Admitting: Emergency Medicine

## 2014-08-21 DIAGNOSIS — F1411 Cocaine abuse, in remission: Secondary | ICD-10-CM | POA: Diagnosis present

## 2014-08-21 DIAGNOSIS — R569 Unspecified convulsions: Secondary | ICD-10-CM

## 2014-08-21 DIAGNOSIS — Z91199 Patient's noncompliance with other medical treatment and regimen due to unspecified reason: Secondary | ICD-10-CM

## 2014-08-21 DIAGNOSIS — G40909 Epilepsy, unspecified, not intractable, without status epilepticus: Secondary | ICD-10-CM | POA: Insufficient documentation

## 2014-08-21 DIAGNOSIS — F1410 Cocaine abuse, uncomplicated: Principal | ICD-10-CM | POA: Insufficient documentation

## 2014-08-21 DIAGNOSIS — R4182 Altered mental status, unspecified: Secondary | ICD-10-CM | POA: Insufficient documentation

## 2014-08-21 DIAGNOSIS — Z79891 Long term (current) use of opiate analgesic: Secondary | ICD-10-CM | POA: Insufficient documentation

## 2014-08-21 DIAGNOSIS — F1490 Cocaine use, unspecified, uncomplicated: Secondary | ICD-10-CM

## 2014-08-21 DIAGNOSIS — R531 Weakness: Secondary | ICD-10-CM | POA: Insufficient documentation

## 2014-08-21 DIAGNOSIS — I499 Cardiac arrhythmia, unspecified: Secondary | ICD-10-CM | POA: Insufficient documentation

## 2014-08-21 DIAGNOSIS — Z9119 Patient's noncompliance with other medical treatment and regimen: Secondary | ICD-10-CM | POA: Insufficient documentation

## 2014-08-21 DIAGNOSIS — Z883 Allergy status to other anti-infective agents status: Secondary | ICD-10-CM | POA: Insufficient documentation

## 2014-08-21 DIAGNOSIS — R51 Headache: Secondary | ICD-10-CM | POA: Insufficient documentation

## 2014-08-21 DIAGNOSIS — R079 Chest pain, unspecified: Secondary | ICD-10-CM | POA: Insufficient documentation

## 2014-08-21 DIAGNOSIS — F1290 Cannabis use, unspecified, uncomplicated: Secondary | ICD-10-CM | POA: Insufficient documentation

## 2014-08-21 DIAGNOSIS — Z791 Long term (current) use of non-steroidal anti-inflammatories (NSAID): Secondary | ICD-10-CM | POA: Insufficient documentation

## 2014-08-21 DIAGNOSIS — I4430 Unspecified atrioventricular block: Secondary | ICD-10-CM | POA: Insufficient documentation

## 2014-08-21 DIAGNOSIS — F17200 Nicotine dependence, unspecified, uncomplicated: Secondary | ICD-10-CM | POA: Insufficient documentation

## 2014-08-21 DIAGNOSIS — F10920 Alcohol use, unspecified with intoxication, uncomplicated: Secondary | ICD-10-CM

## 2014-08-21 DIAGNOSIS — F10129 Alcohol abuse with intoxication, unspecified: Secondary | ICD-10-CM | POA: Insufficient documentation

## 2014-08-21 HISTORY — DX: Allergy, unspecified, initial encounter: T78.40XA

## 2014-08-21 LAB — COMPREHENSIVE METABOLIC PANEL
ALBUMIN: 4.5 g/dL (ref 3.5–5.0)
ALT: 21 U/L (ref 14–54)
ANION GAP: 11 (ref 5–15)
AST: 44 U/L — ABNORMAL HIGH (ref 15–41)
Alkaline Phosphatase: 71 U/L (ref 38–126)
BUN: 7 mg/dL (ref 6–20)
CALCIUM: 9.5 mg/dL (ref 8.9–10.3)
CO2: 22 mmol/L (ref 22–32)
CREATININE: 0.56 mg/dL (ref 0.44–1.00)
Chloride: 108 mmol/L (ref 101–111)
GFR calc Af Amer: >60 mL/min (ref >60)
GFR calc non Af Amer: >60 mL/min (ref >60)
Glucose, Bld: 82 mg/dL (ref 65–99)
Potassium: 3.5 mmol/L (ref 3.5–5.1)
Sodium: 141 mmol/L (ref 135–145)
Total Bilirubin: 0.4 mg/dL (ref 0.3–1.2)
Total Protein: 8.1 g/dL (ref 6.5–8.1)

## 2014-08-21 LAB — URINE DRUG SCREEN, QUALITATIVE (ARMC ONLY)
Amphetamines, Ur Screen: NOT DETECTED
Barbiturates, Ur Screen: NOT DETECTED
Benzodiazepine, Ur Scrn: POSITIVE — AB
COCAINE METABOLITE, UR ~~LOC~~: POSITIVE — AB
Cannabinoid 50 Ng, Ur ~~LOC~~: NOT DETECTED
MDMA (Ecstasy)Ur Screen: NOT DETECTED
Methadone Scn, Ur: NOT DETECTED
Opiate, Ur Screen: NOT DETECTED
Phencyclidine (PCP) Ur S: NOT DETECTED
Tricyclic, Ur Screen: NOT DETECTED

## 2014-08-21 LAB — PHENYTOIN LEVEL, TOTAL: Phenytoin Lvl: <2.5 ug/mL — ABNORMAL LOW (ref 10.0–20.0)

## 2014-08-21 LAB — CBC WITH DIFFERENTIAL/PLATELET
BASOS PCT: 1 %
Basophils Absolute: 0 10*3/uL (ref 0–0.1)
EOS PCT: 1 %
Eosinophils Absolute: 0 10*3/uL (ref 0–0.7)
HCT: 40.1 % (ref 35.0–47.0)
Hemoglobin: 13.8 g/dL (ref 12.0–16.0)
LYMPHS ABS: 1.9 10*3/uL (ref 1.0–3.6)
Lymphocytes Relative: 39 %
MCH: 32.4 pg (ref 26.0–34.0)
MCHC: 34.3 g/dL (ref 32.0–36.0)
MCV: 94.3 fL (ref 80.0–100.0)
MONO ABS: 0.3 10*3/uL (ref 0.2–0.9)
Monocytes Relative: 7 %
Neutro Abs: 2.6 10*3/uL (ref 1.4–6.5)
Neutrophils Relative %: 54 %
Platelets: 146 10*3/uL — ABNORMAL LOW (ref 150–440)
RBC: 4.26 MIL/uL (ref 3.80–5.20)
RDW: 12.6 % (ref 11.5–14.5)
WBC: 4.9 10*3/uL (ref 3.6–11.0)

## 2014-08-21 LAB — URINALYSIS COMPLETE WITH MICROSCOPIC (ARMC ONLY)
Bilirubin Urine: NEGATIVE
Glucose, UA: NEGATIVE mg/dL
Hgb urine dipstick: NEGATIVE
KETONES UR: NEGATIVE mg/dL
Leukocytes, UA: NEGATIVE
Nitrite: NEGATIVE
PH: 5 (ref 5.0–8.0)
Protein, ur: NEGATIVE mg/dL
SPECIFIC GRAVITY, URINE: 1.002 — AB (ref 1.005–1.030)
WBC UA: NONE SEEN WBC/hpf (ref 0–5)

## 2014-08-21 LAB — TSH: TSH: 1.188 u[IU]/mL (ref 0.350–4.500)

## 2014-08-21 LAB — ACETAMINOPHEN LEVEL: Acetaminophen (Tylenol), Serum: <10 ug/mL — ABNORMAL LOW (ref 10–30)

## 2014-08-21 LAB — HEMOGLOBIN A1C: Hgb A1c MFr Bld: 5.2 % (ref 4.0–6.0)

## 2014-08-21 LAB — TROPONIN I: Troponin I: <0.03 ng/mL (ref <0.031)

## 2014-08-21 LAB — SALICYLATE LEVEL

## 2014-08-21 LAB — ETHANOL: ALCOHOL ETHYL (B): 268 mg/dL — AB (ref <5)

## 2014-08-21 MED ORDER — HALOPERIDOL LACTATE 5 MG/ML IJ SOLN
5.0000 mg | Freq: Once | INTRAMUSCULAR | Status: AC
  Administered 2014-08-21: 5 mg via INTRAMUSCULAR
  Filled 2014-08-21: qty 1

## 2014-08-21 MED ORDER — ONDANSETRON HCL 4 MG PO TABS: 4.0000 mg | ORAL_TABLET | Freq: Four times a day (QID) | ORAL | Status: DC | PRN

## 2014-08-21 MED ORDER — SODIUM CHLORIDE 0.9 % IV BOLUS (SEPSIS)
1000.0000 mL | Freq: Once | INTRAVENOUS | Status: AC
  Administered 2014-08-21: 1000 mL via INTRAVENOUS

## 2014-08-21 MED ORDER — SODIUM CHLORIDE 0.9 % IV SOLN: 1000.0000 mg | Freq: Once | INTRAVENOUS | Status: DC

## 2014-08-21 MED ORDER — SODIUM CHLORIDE 0.9 % IJ SOLN: 3.0000 mL | Freq: Two times a day (BID) | INTRAMUSCULAR | Status: DC

## 2014-08-21 MED ORDER — LORAZEPAM 2 MG/ML IJ SOLN
1.0000 mg | Freq: Once | INTRAMUSCULAR | Status: AC
  Administered 2014-08-21: 1 mg via INTRAVENOUS

## 2014-08-21 MED ORDER — HEPARIN SODIUM (PORCINE) 5000 UNIT/ML IJ SOLN
5000.0000 [IU] | Freq: Three times a day (TID) | INTRAMUSCULAR | Status: DC
  Administered 2014-08-21 – 2014-08-23 (×7): 5000 [IU] via SUBCUTANEOUS
  Filled 2014-08-21 (×7): qty 1

## 2014-08-21 MED ORDER — SODIUM CHLORIDE 0.9 % IV SOLN
INTRAVENOUS | Status: DC
  Administered 2014-08-21 – 2014-08-22 (×2): via INTRAVENOUS

## 2014-08-21 MED ORDER — SODIUM CHLORIDE 0.9 % IV SOLN
1000.0000 mg | Freq: Once | INTRAVENOUS | Status: AC
  Administered 2014-08-21: 1000 mg via INTRAVENOUS
  Filled 2014-08-21: qty 20

## 2014-08-21 MED ORDER — LORAZEPAM 2 MG/ML IJ SOLN
INTRAMUSCULAR | Status: AC
  Administered 2014-08-21: 1 mg via INTRAVENOUS
  Filled 2014-08-21: qty 1

## 2014-08-21 MED ORDER — ONDANSETRON HCL 4 MG/2ML IJ SOLN: 4.0000 mg | Freq: Four times a day (QID) | INTRAMUSCULAR | Status: DC | PRN

## 2014-08-21 NOTE — ED Notes (Signed)
Pt remains agitated and combative. MD aware.

## 2014-08-21 NOTE — ED Notes (Signed)
Will adm meds and start another IV once pt is sedated. Pt currently combative and refusing all treatment.

## 2014-08-21 NOTE — Progress Notes (Signed)
Dr Guss Bundechalla in to see pt on consult for psych

## 2014-08-21 NOTE — Plan of Care (Signed)
Problem: Discharge Progression Outcomes Goal: Discharge plan in place and appropriate 1. Prefers to be called [place] 2. Med hx daily smoker at home; offer smoking cessation to pt; upon admission pt lethargic; monitor for nicotine withdrawal while in the hospital. 3. High Fall Risks - pt IVC upon admission, 08/21/14; sitter at bedside; elimination schedule in place 4. Adm Dx Seizure on 08/21/14; monitor pt (IVC in place) for seizure activity; seizure precautions in place

## 2014-08-21 NOTE — Progress Notes (Signed)
[facility] [place] at [facility]   PATIENT NAME: [name]    MR#:  [id number]  DATE OF BIRTH:  October 26, 1965  SUBJECTIVE:  CHIEF COMPLAINT:  Patient is resting comfortably. Feels hungry and wants to eat. Awake and alert  REVIEW OF SYSTEMS:  CONSTITUTIONAL: No fever, fatigue or weakness.  EYES: No blurred or double vision.  EARS, NOSE, AND THROAT: No tinnitus or ear pain.  RESPIRATORY: No cough, shortness of breath, wheezing or hemoptysis.  CARDIOVASCULAR: No chest pain, orthopnea, edema.  GASTROINTESTINAL: No nausea, vomiting, diarrhea or abdominal pain.  GENITOURINARY: No dysuria, hematuria.  ENDOCRINE: No polyuria, nocturia,  HEMATOLOGY: No anemia, easy bruising or bleeding SKIN: No rash or lesion. MUSCULOSKELETAL: No joint pain or arthritis.   NEUROLOGIC: No tingling, numbness, weakness.  PSYCHIATRY: No anxiety or depression.   DRUG ALLERGIES:   Allergies  Allergen Reactions  . Flagyl [Metronidazole] Other (See Comments)    "my eyes go back in the back of my head"    VITALS:  Blood pressure 101/70, pulse 85, temperature 98.3 F (36.8 C), temperature source Oral, resp. rate 16, height 5\' 5"  (1.651 m), weight 49.896 kg (110 lb), SpO2 95 %.  PHYSICAL EXAMINATION:  GENERAL:  49 y.o.-year-old patient lying in the bed with no acute distress.  EYES: Pupils equal, round, reactive to light and accommodation. No scleral icterus. Extraocular muscles intact.  HEENT: Head atraumatic, normocephalic. Oropharynx and nasopharynx clear.  NECK:  Supple, no jugular venous distention. No thyroid enlargement, no tenderness.  LUNGS: Normal breath sounds bilaterally, no wheezing, rales,rhonchi or crepitation. No use of accessory muscles of respiration.  CARDIOVASCULAR: S1, S2 normal. No murmurs, rubs, or gallops.  ABDOMEN: Soft, nontender, nondistended. Bowel sounds present. No organomegaly or mass.  EXTREMITIES: No pedal edema, cyanosis, or clubbing.   NEUROLOGIC: Cranial nerves II through XII are intact. Muscle strength 5/5 in all extremities. Sensation intact. Gait not checked.  PSYCHIATRIC: The patient is alert and oriented x 3.  SKIN: No obvious rash, lesion, or ulcer.    LABORATORY PANEL:   CBC  Recent Labs Lab 08/21/14 0207  WBC 4.9  HGB 13.8  HCT 40.1  PLT 146*   ------------------------------------------------------------------------------------------------------------------  Chemistries   Recent Labs Lab 08/21/14 0207  NA 141  K 3.5  CL 108  CO2 22  GLUCOSE 82  BUN 7  CREATININE 0.56  CALCIUM 9.5  AST 44*  ALT 21  ALKPHOS 71  BILITOT 0.4   ------------------------------------------------------------------------------------------------------------------  Cardiac Enzymes  Recent Labs Lab 08/21/14 0239  TROPONINI <0.03   ------------------------------------------------------------------------------------------------------------------  RADIOLOGY:  Ct Head Wo Contrast  08/21/2014   CLINICAL DATA:  Slurred speech, chest pain. Poor historian. History of seizures.  EXAM: CT HEAD WITHOUT CONTRAST  TECHNIQUE: Contiguous axial images were obtained from the base of the skull through the vertex without intravenous contrast.  COMPARISON:  MRI brain February 01, 2013  FINDINGS: The ventricles and sulci are normal. No intraparenchymal hemorrhage, mass effect nor midline shift. No acute large vascular territory infarcts.  No abnormal extra-axial fluid collections. Basal cisterns are patent.  No skull fracture. The included ocular globes and orbital contents are non-suspicious. The mastoid aircells and included paranasal sinuses are well-aerated.  IMPRESSION: No acute intracranial process; stable appearance of the head from February 01, 2013 given differences in imaging technique.   Electronically Signed   By: Awilda Metro M.D.   On: 08/21/2014 04:20    EKG:   Orders placed or performed during the hospital  encounter of 08/21/14  . EKG 12-Lead  . EKG 12-Lead    ASSESSMENT AND PLAN:   This is a 49 year old African female admitted for seizure disorder and polysubstance abuse. 1. Seizure disorder: The patient has been loaded with Dilantin. Will continue antiseizure medication and hydrate the patient. She has a history of noncompliance which is obviously lead to uncontrolled seizure activity. Since abuse has also contributed to her lowered seizure threshold. Will consider neurology consult if patient seizes again 2. Polysubstance abuse: IVC, one-on-one observation ,psychiatry consult and  Care management consult for possible rehabilitation placement  3. DVT prophylaxis: Heparin 4. GI prophylaxis: None     All the records are reviewed and case discussed with Care Management/Social Workerr. Management plans discussed with the patient, family and they are in agreement.  CODE STATUS: Full code  TOTAL TIME TAKING CARE OF THIS PATIENT: 35 minutes.   POSSIBLE D/C IN 1-2 DAYS, DEPENDING ON CLINICAL CONDITION and psych evaluation   [name] on 08/21/2014 at 1:16 PM  Between 7am to 6pm - Pager - [phone] After 6pm go to [email or web address] - password [email or web address] [facility]  [facility]  Office  [phone]  CC: Primary care physician; No primary care provider on file.

## 2014-08-21 NOTE — ED Notes (Signed)
Pt sleeping soundly, resp even and unlabored Resp 18.

## 2014-08-21 NOTE — ED Notes (Signed)
Pt in CT at this time.

## 2014-08-21 NOTE — ED Notes (Signed)
Pt is refusing treatment, Dr Dolores FrameSung notified

## 2014-08-21 NOTE — ED Notes (Signed)
Report received from Laura RN

## 2014-08-21 NOTE — ED Provider Notes (Signed)
[facility] Emergency Department Provider Note  ____________________________________________  Time seen: Approximately 2:04 AM  I have reviewed the triage vital signs and the nursing notes.   HISTORY  Chief Complaint Seizure History limited by intoxication  HPI [name] is a 49 y.o. female who presents via EMS s/p seizure. Patient has a seizure history and is supposed to be taking Dilantin and Keppra. Patient admits she has not taken these medicines in over 1 month. States she was drinking with family and friends when she had her seizure. No family available to verify details of seizure. Per EMS, patient was combative upon their arrival and in a post- ictal state. Reportedly had multiple seizures prior to arrival. Patient admits to using cocaine tonight. Denies striking head or head injury. Received 4 mg IM Versed per EMS. Currently resting in no acute distress and cooperative.   Past medical history Seizure disorder   Patient Active Problem List   Diagnosis Date Noted  . Seizure disorder 08/21/2014    History reviewed. No pertinent past surgical history.  Current Outpatient Rx  Name  Route  Sig  Dispense  Refill  . ibuprofen (ADVIL,MOTRIN) 800 MG tablet   Oral   Take 1 tablet (800 mg total) by mouth every 8 (eight) hours as needed.   30 tablet   0   . oxyCODONE-acetaminophen (ROXICET) 5-325 MG per tablet   Oral   Take 1-2 tablets by mouth every 4 (four) hours as needed for severe pain.   15 tablet   0     Allergies Flagyl  History reviewed. No pertinent family history.  Social History History  Substance Use Topics  . Smoking status: Current Every Day Smoker  . Smokeless tobacco: Not on file  . Alcohol Use: No  Recent alcohol use Recent cocaine use  Review of Systems Constitutional: No fever/chills Eyes: No visual changes. ENT: No sore throat. Cardiovascular: Denies chest pain. Respiratory: Denies shortness of  breath. Gastrointestinal: No abdominal pain.  No nausea, no vomiting.  No diarrhea.  No constipation. Genitourinary: Negative for dysuria. Musculoskeletal: Negative for back pain. Skin: Negative for rash. Neurological: Negative for headaches, focal weakness or numbness. Positive for seizures.  Limited by intoxication; 10-point ROS otherwise negative.  ____________________________________________   PHYSICAL EXAM:  VITAL SIGNS: ED Triage Vitals  Enc Vitals Group     BP --      Pulse --      Resp --      Temp --      Temp src --      SpO2 --      Weight --      Height --      Head Cir --      Peak Flow --      Pain Score --      Pain Loc --      Pain Edu? --      Excl. in GC? --     Constitutional: Alert and oriented. Well appearing and in no acute distress. Eyes: Conjunctivae are normal. PERRL. EOMI. Head: Atraumatic. Nose: No congestion/rhinnorhea. Mouth/Throat: Mucous membranes are moist.  Oropharynx non-erythematous. Edentulous. Neck: No stridor. No cervical spine tenderness to palpation. Cardiovascular: Normal rate, regular rhythm. Grossly normal heart sounds.  Good peripheral circulation. Respiratory: Normal respiratory effort.  No retractions. Lungs CTAB. Gastrointestinal: Soft and nontender. No distention. No abdominal bruits. No CVA tenderness. Musculoskeletal: No lower extremity tenderness nor edema.  No joint effusions. Neurologic:  Normal speech and  language. No gross focal neurologic deficits are appreciated.Skin:  Skin is warm, dry and intact. No rash noted. Psychiatric: Mood and affect are normal. Speech and behavior are normal.  ____________________________________________   LABS (all labs ordered are listed, but only abnormal results are displayed)  Labs Reviewed  PHENYTOIN LEVEL, TOTAL - Abnormal; Notable for the following:    Phenytoin Lvl <2.5 (*)    All other components within normal limits  CBC WITH DIFFERENTIAL/PLATELET - Abnormal; Notable  for the following:    Platelets 146 (*)    All other components within normal limits  COMPREHENSIVE METABOLIC PANEL - Abnormal; Notable for the following:    AST 44 (*)    All other components within normal limits  ACETAMINOPHEN LEVEL - Abnormal; Notable for the following:    Acetaminophen (Tylenol), Serum <10 (*)    All other components within normal limits  ETHANOL - Abnormal; Notable for the following:    Alcohol, Ethyl (B) 268 (*)    All other components within normal limits  URINE DRUG SCREEN, QUALITATIVE ([facility] ONLY) - Abnormal; Notable for the following:    Cocaine Metabolite,Ur  POSITIVE (*)    Benzodiazepine, Ur Scrn POSITIVE (*)    All other components within normal limits  URINALYSIS COMPLETEWITH MICROSCOPIC ([facility] ONLY) - Abnormal; Notable for the following:    Color, Urine COLORLESS (*)    APPearance CLEAR (*)    Specific Gravity, Urine 1.002 (*)    Bacteria, UA RARE (*)    Squamous Epithelial / LPF 0-5 (*)    All other components within normal limits  SALICYLATE LEVEL  TROPONIN I   ____________________________________________  EKG  ED ECG REPORT I, Julion Gatt J, the attending physician, personally viewed and interpreted this ECG.   Date: 08/21/2014  EKG Time: 0159  Rate: 107  Rhythm: sinus tachycardia  Axis: Normal  Intervals:none  ST&T Change: Nonspecific  ____________________________________________  RADIOLOGY  CT head without contrast interpreted per Dr. Karie KirksBloomer: No acute intracranial process; stable appearance of the head from February 01, 2013 given differences in imaging technique.  ____________________________________________   PROCEDURES  Procedure(s) performed: None  Critical Care performed:  CRITICAL CARE Performed by: Irean HongSUNG,Tykerria Mccubbins J   Total critical care time:  45 minutes   Critical care time was exclusive of separately billable procedures and treating other patients.  Critical care was necessary to treat or prevent imminent or  life-threatening deterioration.  Critical care was time spent personally by me on the following activities: development of treatment plan with patient and/or surrogate as well as nursing, discussions with consultants, evaluation of patient's response to treatment, examination of patient, obtaining history from patient or surrogate, ordering and performing treatments and interventions, ordering and review of laboratory studies, ordering and review of radiographic studies, pulse oximetry and re-evaluation of patient's condition.  ____________________________________________   INITIAL IMPRESSION / ASSESSMENT AND PLAN / ED COURSE  Pertinent labs & imaging results that were available during my care of the patient were reviewed by me and considered in my medical decision making (see chart for details).  49 year old female with a history of seizure disorder who presents s/p multiple seizures. Endorses EtOH and cocaine use. Will obtain CT head to evaluate intracranial hemorrhage given cocaine use and seizure history; obtain screening toxicological laboratory work including EtOH, acetaminophen and salicylate. IV fluid resuscitation and will reassess.  ----------------------------------------- 2:55 AM on 08/21/2014 -----------------------------------------  Patient was noted to be agitated and refusing blood draw. Subsequently had generalized, tonic-clonic seizure. IV Ativan given with prompt  relief of seizure. Will go ahead and give dose of IV Cerebyx.  ----------------------------------------- 3:06 AM on 08/21/2014 -----------------------------------------  Patient again combative and postictal, pulled out IV and refusing venipuncture. IM Haldol ordered for sedation in order to properly and thoroughly evaluate patient with laboratory and imaging studies. There is now a friend at the bedside who is fairly vague on the quantity and quality of patient's seizures while at  home.  ----------------------------------------- 4:41 AM on 08/21/2014 -----------------------------------------  Patient now asleep. Laboratory results notable for elevated EtOH, low Dilantin level, cocaine positive on urine tox screen. Given patient's multiple seizures tonight, multiple substances on board and medical noncompliance, I have discussed case with hospitalist to evaluate patient in the ED for hospital admission.  ----------------------------------------- 5:07 AM on 08/21/2014 -----------------------------------------  Patient awoke, is currently combative and agitated, striking at staff. Attempting to leave the premises. Friend at bedside is unable to calm her. Multiple attempts at verbal redirection unsuccessful. Patient is attempting to remove her newly placed IV and ambulating with very unsteady gait around her room. At this point, given her intoxication with multiple substances and lack of insight and poor judgment, I will place patient under IVC for her safety and protection.  ----------------------------------------- 6:57 AM on 08/21/2014 -----------------------------------------  Patient did not require additional IV sedatives. After she stomped around the room, she got back in bed and fell asleep. ____________________________________________   FINAL CLINICAL IMPRESSION(S) / ED DIAGNOSES  Final diagnoses:  Seizure  Cocaine use  Alcohol intoxication, uncomplicated  H/O noncompliance with medical treatment, presenting hazards to health      Irean Hong, MD 08/21/14 [phone]

## 2014-08-21 NOTE — Plan of Care (Signed)
Problem: Discharge Progression Outcomes Goal: Discharge plan in place and appropriate Outcome: Progressing Possible rehab at discharge per md Goal: Pain controlled with appropriate interventions Outcome: Progressing No c/o pain Goal: Hemodynamically stable Outcome: Progressing No seizure activity. 1:1 sitter at bedside for ivc Goal: Complications resolved/controlled Outcome: Progressing See above Goal: Tolerating diet Outcome: Progressing Tolerating diet Goal: Activity appropriate for discharge plan Outcome: Not Progressing Using bedpan for now Goal: Other Discharge Outcomes/Goals Outcome: Progressing See above note

## 2014-08-21 NOTE — ED Notes (Addendum)
Seizure pads to bed after pt seizure.  Pt attempting to pull out IV once pt saw this RN administering meds.

## 2014-08-21 NOTE — Consult Note (Signed)
Pt seen in Room No 103 [facility]. Pt is a 49 yr old AA female, not employed not married and had seizures since she was 49 yrs old. Staff reports that she has been non complaint with seizure meds and has been using Cocaine. Pt reports that she has been on Depakote and Dilantin for seizures and comes to [facility] to get her meds. Past psych history Pt refuses to talk and became irritable and negative when these questions were asked. Further mental status could not be done. 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 Rec Continue pt on her anti-seizure meds and re-eval pt for mental status when she is stable and wants  [facility]

## 2014-08-21 NOTE — H&P (Signed)
[name] is an 49 y.o. female.   Chief Complaint: Seizure HPI: Patient presents to the hospital via EMS following a seizure. He was given Versed by the EMS responders. In the emergency department the patient was able to admit that she has not been taking her antiseizure medication and that she has taken cocaine tonight. And then had another seizure in the emergency department which stopped after Ativan. However she became combative and removed her peripheral IV. Currently given Haldol IM for sedation prior to head CT to rule out intracranial bleed. Imaging was negative for any intracranial process. The patient was loaded with Dilantin and the emergency department staff called for admission for stabilization of seizure activity.  History reviewed. No pertinent past medical history.  History reviewed. No pertinent past surgical history.  History reviewed. No pertinent family history. Social History:  reports that she has been smoking.  She does not have any smokeless tobacco history on file. She reports that she does not drink alcohol. Her drug history is not on file.  Allergies:  Allergies  Allergen Reactions  . Flagyl [Metronidazole] Other (See Comments)    "my eyes go back in the back of my head"    Medications Prior to Admission  Medication Sig Dispense Refill  . ibuprofen (ADVIL,MOTRIN) 800 MG tablet Take 1 tablet (800 mg total) by mouth every 8 (eight) hours as needed. 30 tablet 0  . oxyCODONE-acetaminophen (ROXICET) 5-325 MG per tablet Take 1-2 tablets by mouth every 4 (four) hours as needed for severe pain. 15 tablet 0    Results for orders placed or performed during the hospital encounter of 08/21/14 (from the past 48 hour(s))  Phenytoin level, total     Status: Abnormal   Collection Time: 08/21/14  2:07 AM  Result Value Ref Range   Phenytoin Lvl <2.5 (L) 10.0 - 20.0 ug/mL  CBC with Differential     Status: Abnormal   Collection Time: 08/21/14  2:07 AM  Result Value Ref  Range   WBC 4.9 3.6 - 11.0 K/uL   RBC 4.26 3.80 - 5.20 MIL/uL   Hemoglobin 13.8 12.0 - 16.0 g/dL   HCT 40.1 35.0 - 47.0 %   MCV 94.3 80.0 - 100.0 fL   MCH 32.4 26.0 - 34.0 pg   MCHC 34.3 32.0 - 36.0 g/dL   RDW 12.6 11.5 - 14.5 %   Platelets 146 (L) 150 - 440 K/uL   Neutrophils Relative % 54 %   Neutro Abs 2.6 1.4 - 6.5 K/uL   Lymphocytes Relative 39 %   Lymphs Abs 1.9 1.0 - 3.6 K/uL   Monocytes Relative 7 %   Monocytes Absolute 0.3 0.2 - 0.9 K/uL   Eosinophils Relative 1 %   Eosinophils Absolute 0.0 0 - 0.7 K/uL   Basophils Relative 1 %   Basophils Absolute 0.0 0 - 0.1 K/uL  Comprehensive metabolic panel     Status: Abnormal   Collection Time: 08/21/14  2:07 AM  Result Value Ref Range   Sodium 141 135 - 145 mmol/L   Potassium 3.5 3.5 - 5.1 mmol/L   Chloride 108 101 - 111 mmol/L   CO2 22 22 - 32 mmol/L   Glucose, Bld 82 65 - 99 mg/dL   BUN 7 6 - 20 mg/dL   Creatinine, Ser 0.56 0.44 - 1.00 mg/dL   Calcium 9.5 8.9 - 10.3 mg/dL   Total Protein 8.1 6.5 - 8.1 g/dL   Albumin 4.5 3.5 - 5.0 g/dL  AST 44 (H) 15 - 41 U/L   ALT 21 14 - 54 U/L   Alkaline Phosphatase 71 38 - 126 U/L   Total Bilirubin 0.4 0.3 - 1.2 mg/dL   GFR calc non Af Amer >60 >60 mL/min   GFR calc Af Amer >60 >60 mL/min    Comment: (NOTE) The eGFR has been calculated using the CKD EPI equation. This calculation has not been validated in all clinical situations. eGFR's persistently <60 mL/min signify possible Chronic Kidney Disease.    Anion gap 11 5 - 15  Acetaminophen level     Status: Abnormal   Collection Time: 08/21/14  2:07 AM  Result Value Ref Range   Acetaminophen (Tylenol), Serum <10 (L) 10 - 30 ug/mL    Comment:        THERAPEUTIC CONCENTRATIONS VARY SIGNIFICANTLY. A RANGE OF 10-30 ug/mL MAY BE AN EFFECTIVE CONCENTRATION FOR MANY PATIENTS. HOWEVER, SOME ARE BEST TREATED AT CONCENTRATIONS OUTSIDE THIS RANGE. ACETAMINOPHEN CONCENTRATIONS >150 ug/mL AT 4 HOURS AFTER INGESTION AND >50 ug/mL AT  12 HOURS AFTER INGESTION ARE OFTEN ASSOCIATED WITH TOXIC REACTIONS.   Ethanol     Status: Abnormal   Collection Time: 08/21/14  2:07 AM  Result Value Ref Range   Alcohol, Ethyl (B) 268 (H) <5 mg/dL    Comment:        LOWEST DETECTABLE LIMIT FOR SERUM ALCOHOL IS 5 mg/dL FOR MEDICAL PURPOSES ONLY   Salicylate level     Status: None   Collection Time: 08/21/14  2:07 AM  Result Value Ref Range   Salicylate Lvl <2.7 2.8 - 30.0 mg/dL  Urine Drug Screen, Qualitative ([facility] only)     Status: Abnormal   Collection Time: 08/21/14  2:39 AM  Result Value Ref Range   Tricyclic, Ur Screen NONE DETECTED NONE DETECTED   Amphetamines, Ur Screen NONE DETECTED NONE DETECTED   MDMA (Ecstasy)Ur Screen NONE DETECTED NONE DETECTED   Cocaine Metabolite,Ur [place] POSITIVE (A) NONE DETECTED   Opiate, Ur Screen NONE DETECTED NONE DETECTED   Phencyclidine (PCP) Ur S NONE DETECTED NONE DETECTED   Cannabinoid 50 Ng, Ur [place] NONE DETECTED NONE DETECTED   Barbiturates, Ur Screen NONE DETECTED NONE DETECTED   Benzodiazepine, Ur Scrn POSITIVE (A) NONE DETECTED   Methadone Scn, Ur NONE DETECTED NONE DETECTED    Comment: (NOTE) 078  Tricyclics, urine               Cutoff 1000 ng/mL 200  Amphetamines, urine             Cutoff 1000 ng/mL 300  MDMA (Ecstasy), urine           Cutoff 500 ng/mL 400  Cocaine Metabolite, urine       Cutoff 300 ng/mL 500  Opiate, urine                   Cutoff 300 ng/mL 600  Phencyclidine (PCP), urine      Cutoff 25 ng/mL 700  Cannabinoid, urine              Cutoff 50 ng/mL 800  Barbiturates, urine             Cutoff 200 ng/mL 900  Benzodiazepine, urine           Cutoff 200 ng/mL 1000 Methadone, urine                Cutoff 300 ng/mL 1100 1200 The urine drug screen provides only a preliminary, unconfirmed 1300  analytical test result and should not be used for non-medical 1400 purposes. Clinical consideration and professional judgment should 1500 be applied to any positive drug screen  result due to possible 1600 interfering substances. A more specific alternate chemical method 1700 must be used in order to obtain a confirmed analytical result.  1800 Gas chromato graphy / mass spectrometry (GC/MS) is the preferred 1900 confirmatory method.   Urinalysis complete, with microscopic ([facility] only)     Status: Abnormal   Collection Time: 08/21/14  2:39 AM  Result Value Ref Range   Color, Urine COLORLESS (A) YELLOW   APPearance CLEAR (A) CLEAR   Glucose, UA NEGATIVE NEGATIVE mg/dL   Bilirubin Urine NEGATIVE NEGATIVE   Ketones, ur NEGATIVE NEGATIVE mg/dL   Specific Gravity, Urine 1.002 (L) 1.005 - 1.030   Hgb urine dipstick NEGATIVE NEGATIVE   pH 5.0 5.0 - 8.0   Protein, ur NEGATIVE NEGATIVE mg/dL   Nitrite NEGATIVE NEGATIVE   Leukocytes, UA NEGATIVE NEGATIVE   RBC / HPF 0-5 0 - 5 RBC/hpf   WBC, UA NONE SEEN 0 - 5 WBC/hpf   Bacteria, UA RARE (A) NONE SEEN   Squamous Epithelial / LPF 0-5 (A) NONE SEEN  Troponin I     Status: None   Collection Time: 08/21/14  2:39 AM  Result Value Ref Range   Troponin I <0.03 <0.031 ng/mL    Comment:        NO INDICATION OF MYOCARDIAL INJURY.    Ct Head Wo Contrast  08/21/2014   CLINICAL DATA:  Slurred speech, chest pain. Poor historian. History of seizures.  EXAM: CT HEAD WITHOUT CONTRAST  TECHNIQUE: Contiguous axial images were obtained from the base of the skull through the vertex without intravenous contrast.  COMPARISON:  MRI brain February 01, 2013  FINDINGS: The ventricles and sulci are normal. No intraparenchymal hemorrhage, mass effect nor midline shift. No acute large vascular territory infarcts.  No abnormal extra-axial fluid collections. Basal cisterns are patent.  No skull fracture. The included ocular globes and orbital contents are non-suspicious. The mastoid aircells and included paranasal sinuses are well-aerated.  IMPRESSION: No acute intracranial process; stable appearance of the head from February 01, 2013 given  differences in imaging technique.   Electronically Signed   By: Elon Alas M.D.   On: 08/21/2014 04:20    Review of Systems  Unable to perform ROS: mental status change    Blood pressure 101/70, pulse 85, temperature 98.3 F (36.8 C), temperature source Oral, resp. rate 16, height _0  (1.651 m), weight 49.896 kg (110 lb), SpO2 95 %. Physical Exam  Vitals reviewed. Constitutional: She is oriented to person, place, and time. She appears well-developed and well-nourished.  HENT:  Head: Normocephalic and atraumatic.  Mouth/Throat: Oropharynx is clear and moist.  Eyes: Conjunctivae and EOM are normal. Pupils are equal, round, and reactive to light.  Neck: Normal range of motion. Neck supple. No JVD present. No tracheal deviation present. No thyromegaly present.  Cardiovascular: Normal rate, regular rhythm and normal heart sounds.  Exam reveals no gallop and no friction rub.   No murmur heard. Respiratory: Effort normal and breath sounds normal.  GI: Soft. Bowel sounds are normal. She exhibits no distension. There is no tenderness.  Genitourinary:  Deferred  Musculoskeletal: Normal range of motion. She exhibits no edema.  Lymphadenopathy:    She has no cervical adenopathy.  Neurological: She is alert and oriented to person, place, and time. No cranial nerve deficit. She exhibits normal  muscle tone.  Skin: Skin is warm and dry. No rash noted. No erythema.  Psychiatric:  Unable to assess due to lethargy and uncooperativeness     Assessment/Plan This is a 49 year old African female admitted for seizure disorder and polysubstance abuse. 1. Seizure disorder: The patient has been loaded with Dilantin. Will continue antiseizure medication and hydrate the patient. She has a history of noncompliance which is obviously lead to uncontrolled seizure activity. Since abuse has also contributed to her lowered seizure threshold. Once the patient is stable on antiepileptic medication neurology  consult may be obtained at the discretion of the primary care team. 2. Polysubstance abuse: Care management consult for possible rehabilitation placement on discharge 3. DVT prophylaxis: Heparin 4. GI prophylaxis: None The patient is a full code. Time spent on admission orders and patient care proximally 35 minutes  Harrie Foreman 08/21/2014, 7:54 AM

## 2014-08-21 NOTE — ED Notes (Signed)
Pt poor historian, difficult to understand with slurred speech, pt  reports "I couldn't breath right and my chest is hurting", pt reports ETOH use prior to arrival.  Hx of seizures and pt reports not taking meds for same.

## 2014-08-21 NOTE — ED Notes (Signed)
Assumed care of pt. Pt combative and refusing all treatment. MD aware. Pt in NAD.
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
## 2014-08-22 DIAGNOSIS — F1411 Cocaine abuse, in remission: Secondary | ICD-10-CM | POA: Diagnosis present

## 2014-08-22 DIAGNOSIS — G40909 Epilepsy, unspecified, not intractable, without status epilepticus: Secondary | ICD-10-CM

## 2014-08-22 LAB — CBC
HEMATOCRIT: 37.2 % (ref 35.0–47.0)
Hemoglobin: 12.5 g/dL (ref 12.0–16.0)
MCH: 31.9 pg (ref 26.0–34.0)
MCHC: 33.6 g/dL (ref 32.0–36.0)
MCV: 94.8 fL (ref 80.0–100.0)
Platelets: 126 10*3/uL — ABNORMAL LOW (ref 150–440)
RBC: 3.93 MIL/uL (ref 3.80–5.20)
RDW: 13.3 % (ref 11.5–14.5)
WBC: 5.5 10*3/uL (ref 3.6–11.0)

## 2014-08-22 LAB — BASIC METABOLIC PANEL
ANION GAP: 7 (ref 5–15)
BUN: 6 mg/dL (ref 6–20)
CHLORIDE: 107 mmol/L (ref 101–111)
CO2: 24 mmol/L (ref 22–32)
Calcium: 9 mg/dL (ref 8.9–10.3)
Creatinine, Ser: 0.59 mg/dL (ref 0.44–1.00)
GFR calc Af Amer: >60 mL/min (ref >60)
GFR calc non Af Amer: >60 mL/min (ref >60)
Glucose, Bld: 98 mg/dL (ref 65–99)
POTASSIUM: 3.6 mmol/L (ref 3.5–5.1)
Sodium: 138 mmol/L (ref 135–145)

## 2014-08-22 LAB — MAGNESIUM: Magnesium: 1.8 mg/dL (ref 1.7–2.4)

## 2014-08-22 MED ORDER — ACETAMINOPHEN 325 MG PO TABS
650.0000 mg | ORAL_TABLET | Freq: Four times a day (QID) | ORAL | Status: DC | PRN
  Administered 2014-08-22 (×2): 650 mg via ORAL
  Filled 2014-08-22 (×2): qty 2

## 2014-08-22 MED ORDER — PHENYTOIN SODIUM EXTENDED 100 MG PO CAPS
100.0000 mg | ORAL_CAPSULE | Freq: Three times a day (TID) | ORAL | Status: DC
  Administered 2014-08-22 – 2014-08-23 (×3): 100 mg via ORAL
  Filled 2014-08-22 (×3): qty 1

## 2014-08-22 MED ORDER — PHENYTOIN SODIUM 50 MG/ML IJ SOLN
300.0000 mg | Freq: Once | INTRAMUSCULAR | Status: AC
  Administered 2014-08-22: 17:00:00 300 mg via INTRAVENOUS
  Filled 2014-08-22: qty 6

## 2014-08-22 MED ORDER — DIVALPROEX SODIUM 250 MG PO DR TAB: 500.0000 mg | DELAYED_RELEASE_TABLET | Freq: Two times a day (BID) | ORAL | Status: DC

## 2014-08-22 MED ORDER — DIVALPROEX SODIUM 250 MG PO DR TAB
500.0000 mg | DELAYED_RELEASE_TABLET | Freq: Two times a day (BID) | ORAL | Status: DC
  Administered 2014-08-22 – 2014-08-23 (×2): 500 mg via ORAL
  Filled 2014-08-22 (×2): qty 2

## 2014-08-22 NOTE — Plan of Care (Signed)
Problem: Discharge Progression Outcomes Goal: Other Discharge Outcomes/Goals Outcome: Progressing Progress toward goals: Pain: Pt c/o headache .  Tylenol given with good results. VS: VSS Complications: Pt stated that she was having  a seizure but she was talking to me during.  No notable seizure like activity. Started on Dilantin and Depakote Diet:  Tolerating well. Activity: PT did not work with pt today due to am cardiac rhythm change. Pt got oob with 1 person assist

## 2014-08-22 NOTE — Consult Note (Signed)
[facility] Cardiology Consultation Note  Patient ID: [name], MRN: [id number], DOB/AGE: [birth year] 49 y.o. Admit date: 08/21/2014   Date of Consult: 08/22/2014 Primary Physician: No primary care provider on file. Primary Cardiologist: None  Chief Complaint:  Chief Complaint  Patient presents with  . Altered Mental Status   Reason for Consult: complete heart block  HPI: 49 y.o. female with known seizure disorder since she was 49 years old having seizures off and on for many years who has not been taking her seizure medications in the last many weeks. The patient had a seizure disorder and event which she was brought to the hospital. The patient then was placed on appropriate medication management for this and was also given deep venous thrombosis treatment including Lovenox. This morning the patient had concerns about the shot and had pain with the shot as well as pain with her chest and other areas while the nurse was giving this administration the patient had an extreme vagal aunts with normal sinus rhythm and no ventricular response most consistent with her extreme pain. The patient did not have any seizure during that period of time. Later this afternoon at 1:30 she did have a seizure disorder but no evidence of telemetry changes at that time consistent with heart block.  Past Medical History  Diagnosis Date  . Allergy       Surgical History: History reviewed. No pertinent past surgical history.   Home Meds: Prior to Admission medications   Medication Sig Start Date End Date Taking? Authorizing Provider  ibuprofen (ADVIL,MOTRIN) 800 MG tablet Take 1 tablet (800 mg total) by mouth every 8 (eight) hours as needed. 07/06/14   Evangeline Dakin, PA-C  oxyCODONE-acetaminophen (ROXICET) 5-325 MG per tablet Take 1-2 tablets by mouth every 4 (four) hours as needed for severe pain. 07/06/14   Evangeline Dakin, PA-C    Inpatient Medications:  . divalproex  500 mg Oral Q12H  . heparin   5,000 Units Subcutaneous 3 times per day  . phenytoin  100 mg Oral TID      Allergies:  Allergies  Allergen Reactions  . Flagyl [Metronidazole] Other (See Comments)    "my eyes go back in the back of my head"    History   Social History  . Marital Status: Single    Spouse Name: N/A  . Number of Children: N/A  . Years of Education: N/A   Occupational History  . Not on file.   Social History Main Topics  . Smoking status: Current Every Day Smoker  . Smokeless tobacco: Not on file  . Alcohol Use: Not on file  . Drug Use: Yes    Special: Benzodiazepines, Cocaine  . Sexual Activity: Not on file   Other Topics Concern  . Not on file   Social History Narrative     History reviewed. No pertinent family history.   Review of Systems Positive for seizure disorder Negative for: General:  chills, fever, night sweats or weight changes.  Cardiovascular: PND orthopnea syncope dizziness  Dermatological skin lesions rashes Respiratory: Cough congestion Urologic: Frequent urination urination at night and hematuria Abdominal: negative for nausea, vomiting, diarrhea, bright red blood per rectum, melena, or hematemesis Neurologic: negative for visual changes, and/or hearing changes  All other systems reviewed and are otherwise negative except as noted above.  Labs:  Recent Labs  08/21/14 0239  TROPONINI <0.03   Lab Results  Component Value Date   WBC 5.5 08/22/2014   HGB  12.5 08/22/2014   HCT 37.2 08/22/2014   MCV 94.8 08/22/2014   PLT 126* 08/22/2014    Recent Labs Lab 08/21/14 0207 08/22/14 0835  NA 141 138  K 3.5 3.6  CL 108 107  CO2 22 24  BUN 7 6  CREATININE 0.56 0.59  CALCIUM 9.5 9.0  PROT 8.1  --   BILITOT 0.4  --   ALKPHOS 71  --   ALT 21  --   AST 44*  --   GLUCOSE 82 98   No results found for: CHOL, HDL, LDLCALC, TRIG No results found for: DDIMER  Radiology/Studies:  Ct Head Wo Contrast  08/21/2014   CLINICAL DATA:  Slurred speech, chest  pain. Poor historian. History of seizures.  EXAM: CT HEAD WITHOUT CONTRAST  TECHNIQUE: Contiguous axial images were obtained from the base of the skull through the vertex without intravenous contrast.  COMPARISON:  MRI brain February 01, 2013  FINDINGS: The ventricles and sulci are normal. No intraparenchymal hemorrhage, mass effect nor midline shift. No acute large vascular territory infarcts.  No abnormal extra-axial fluid collections. Basal cisterns are patent.  No skull fracture. The included ocular globes and orbital contents are non-suspicious. The mastoid aircells and included paranasal sinuses are well-aerated.  IMPRESSION: No acute intracranial process; stable appearance of the head from February 01, 2013 given differences in imaging technique.   Electronically Signed   By: Awilda Metro M.D.   On: 08/21/2014 04:20    EKG: Normal sinus rhythm otherwise normal EKG  Weights: Filed Weights   08/21/14 0209 08/22/14 0608  Weight: 110 lb (49.896 kg) 106 lb 9.6 oz (48.353 kg)     Physical Exam: Blood pressure 125/69, pulse 74, temperature 99.3 F (37.4 C), temperature source Oral, resp. rate 18, height 5\' 5"  (1.651 m), weight 106 lb 9.6 oz (48.353 kg), SpO2 100 %. Body mass index is 17.74 kg/(m^2). General: Well developed, well nourished, in no acute distress. Head eyes ears nose throat: Normocephalic, atraumatic, sclera non-icteric, no xanthomas, nares are without discharge. No apparent thyromegaly and/or mass  Lungs: Normal respiratory effort.  no wheezes, no rales, no rhonchi.  Heart: RRR with normal S1 S2. no murmur gallop, no rub, PMI is normal size and placement, carotid upstroke normal without bruit, jugular venous pressure is normal Abdomen: Soft, non-tender, non-distended with normoactive bowel sounds. No hepatomegaly. No rebound/guarding. No obvious abdominal masses. Abdominal aorta is normal size without bruit Extremities: No edema. no cyanosis, no clubbing, no ulcers   Peripheral : 2+ bilateral upper extremity pulses, 2+ bilateral femoral pulses, 2+ bilateral dorsal pedal pulse Neuro: Alert and oriented. No facial asymmetry. No focal deficit. Moves all extremities spontaneously. Musculoskeletal: Normal muscle tone without kyphosis Psych:  Responds to questions appropriately with a normal affect.    Assessment: 49 year old female with seizure disorder with an extreme vagal response likely secondary to pain from Lovenox shot and other manipulation causing arteriovenous block and no evidence of significant symptoms during that time no relationship to her seizures  Plan: 1. No further cardiac intervention at this time 2. Consider echocardiogram for LV systolic dysfunction other valvular heart disease issues 3. Continue telemetry and follow for correlation between seizures and or heart block and/or vagal response for further decision  Signed, Lamar Blinks M.D. [facility] Cardiology 08/22/2014, 6:22 PM

## 2014-08-22 NOTE — Progress Notes (Signed)
[facility] - [place] at [facility]   PATIENT NAME: [name]    MR#:  [id number]  DATE OF BIRTH:  26-Aug-1965  SUBJECTIVE:  CHIEF COMPLAINT:  Patient is resting comfortably. requesting to see a different psychiatrist. Denies any palpitations or dizziness. Asystole was noticed for 4 seconds on telemetry   REVIEW OF SYSTEMS:  CONSTITUTIONAL: No fever, fatigue or weakness.  EYES: No blurred or double vision.  EARS, NOSE, AND THROAT: No tinnitus or ear pain.  RESPIRATORY: No cough, shortness of breath, wheezing or hemoptysis.  CARDIOVASCULAR: No chest pain, orthopnea, edema.  GASTROINTESTINAL: No nausea, vomiting, diarrhea or abdominal pain.  GENITOURINARY: No dysuria, hematuria.  ENDOCRINE: No polyuria, nocturia,  HEMATOLOGY: No anemia, easy bruising or bleeding SKIN: No rash or lesion. MUSCULOSKELETAL: No joint pain or arthritis.   NEUROLOGIC: No tingling, numbness, weakness.  PSYCHIATRY: No anxiety or depression.   DRUG ALLERGIES:   Allergies  Allergen Reactions  . Flagyl [Metronidazole] Other (See Comments)    "my eyes go back in the back of my head"    VITALS:  Blood pressure 125/69, pulse 74, temperature 99.3 F (37.4 C), temperature source Oral, resp. rate 18, height 5\' 5"  (1.651 m), weight 48.353 kg (106 lb 9.6 oz), SpO2 100 %.  PHYSICAL EXAMINATION:  GENERAL:  49 y.o.-year-old patient lying in the bed with no acute distress.  EYES: Pupils equal, round, reactive to light and accommodation. No scleral icterus. Extraocular muscles intact.  HEENT: Head atraumatic, normocephalic. Oropharynx and nasopharynx clear.  NECK:  Supple, no jugular venous distention. No thyroid enlargement, no tenderness.  LUNGS: Normal breath sounds bilaterally, no wheezing, rales,rhonchi or crepitation. No use of accessory muscles of respiration.  CARDIOVASCULAR: S1, S2 normal. No murmurs, rubs, or gallops.  ABDOMEN: Soft, nontender, nondistended. Bowel sounds  present. No organomegaly or mass.  EXTREMITIES: No pedal edema, cyanosis, or clubbing.  NEUROLOGIC: Cranial nerves II through XII are intact. Muscle strength 5/5 in all extremities. Sensation intact. Gait not checked.  PSYCHIATRIC: The patient is alert and oriented x 3.  SKIN: No obvious rash, lesion, or ulcer.    LABORATORY PANEL:   CBC  Recent Labs Lab 08/22/14 0835  WBC 5.5  HGB 12.5  HCT 37.2  PLT 126*   ------------------------------------------------------------------------------------------------------------------  Chemistries   Recent Labs Lab 08/21/14 0207 08/22/14 0835  NA 141 138  K 3.5 3.6  CL 108 107  CO2 22 24  GLUCOSE 82 98  BUN 7 6  CREATININE 0.56 0.59  CALCIUM 9.5 9.0  MG  --  1.8  AST 44*  --   ALT 21  --   ALKPHOS 71  --   BILITOT 0.4  --    ------------------------------------------------------------------------------------------------------------------  Cardiac Enzymes  Recent Labs Lab 08/21/14 0239  TROPONINI <0.03   ------------------------------------------------------------------------------------------------------------------  RADIOLOGY:  Ct Head Wo Contrast  08/21/2014   CLINICAL DATA:  Slurred speech, chest pain. Poor historian. History of seizures.  EXAM: CT HEAD WITHOUT CONTRAST  TECHNIQUE: Contiguous axial images were obtained from the base of the skull through the vertex without intravenous contrast.  COMPARISON:  MRI brain February 01, 2013  FINDINGS: The ventricles and sulci are normal. No intraparenchymal hemorrhage, mass effect nor midline shift. No acute large vascular territory infarcts.  No abnormal extra-axial fluid collections. Basal cisterns are patent.  No skull fracture. The included ocular globes and orbital contents are non-suspicious. The mastoid aircells and included paranasal sinuses are well-aerated.  IMPRESSION: No acute intracranial process; stable appearance of  the head from February 01, 2013 given differences  in imaging technique.   Electronically Signed   By: Awilda Metro M.D.   On: 08/21/2014 04:20    EKG:   Orders placed or performed during the hospital encounter of 08/21/14  . EKG 12-Lead  . EKG 12-Lead  . EKG 12-Lead  . EKG 12-Lead    ASSESSMENT AND PLAN:   This is a 49 year old African female admitted for seizure disorder and polysubstance abuse. 1. Seizure disorder: The patient has been loaded with Dilantin. Will continue antiseizure medication and hydrate the patient. She has a history of noncompliance which is obviously lead to uncontrolled seizure activity. Since substance abuse has also contributed to her lowered seizure threshold. Will consider neurology consult if patient seizes again 2. Polysubstance abuse: IVC, one-on-one observation , patient is refusing to see Dr. Guss Bunde and requesting different psychiatry consult and  Care management consult for possible rehabilitation placement  3. cardiac arrhythmia? Asystole for 4 seconds-12-lead EKG looks normal. Cardiac consult is placed 4. Generalized weakness-PT consult 3. DVT prophylaxis: Heparin 4. GI prophylaxis: None     All the records are reviewed and case discussed with Care Management/Social Workerr. Management plans discussed with the patient, family and they are in agreement.  CODE STATUS: Full code  TOTAL TIME TAKING CARE OF THIS PATIENT: 35 minutes.   POSSIBLE D/C IN 1-2 DAYS, DEPENDING ON CLINICAL CONDITION and psych evaluation   [name] on 08/22/2014 at 2:10 PM  Between 7am to 6pm - Pager - [phone] After 6pm go to [email or web address] - password [email or web address] [facility]  [place] [facility]  Office  [phone]  CC: Primary care physician; No primary care provider on file.

## 2014-08-22 NOTE — Progress Notes (Signed)
PT Cancellation Note  Patient Details Name: Phil DoppDoretha A Haddox MRN: [id number] DOB: 12/23/1965   Cancelled Treatment:    Reason Eval/Treat Not Completed: Other (comment). Patient has not been cooperative with staff and recently had a period of 4" asystole. Patient has a cardiology consult, and has had multiple seizures recently. PT discussed with RN, who states patient needs PT but is not appropriate currently. PT will hold on this consult until patient is more appropriate for mobility evaluation. PT will continue to follow and re-attempt.   Kerin RansomPatrick A Debanhi Blaker, PT, DPT    08/22/2014, 2:35 PM

## 2014-08-22 NOTE — Consult Note (Signed)
[facility] Face-to-Face Psychiatry Consult   Reason for Consult:  Consult for 49 year old woman with a history of cocaine abuse and seizure disorder. Agitated on admission. Referring Physician:  Gouru Patient Identification: [name] MRN:  [id number] Principal Diagnosis: Seizure disorder Diagnosis:   Patient Active Problem List   Diagnosis Date Noted  . Cocaine abuse [F14.10] 08/22/2014  . Seizure disorder [G40.909] 08/21/2014    Total Time spent with patient: 45 minutes  Subjective:   [name] is a 49 y.o. female patient admitted with "my seizures". Patient is also complaining of headaches which she thinks causes her seizures. She specifically does not think that her cocaine use is a problem.Marland Kitchen  HPI:  Information from the patient and the chart. Patient was brought into the emergency room after having a seizure. She was combative and agitated at first. Patient was put under involuntary commitment on the grounds of her cocaine abuse being related to her ongoing seizures. The patient tells me that recently she has been having more seizures than usual. She says sometimes she will have several of them a day. She's also been having a lot of headaches. As his being connected. She has not been taking any anticonvulsive medicine for about a week because she ran out of it. No longer has a regular physician provider to prescribe it. She admits that she has been using cocaine regularly and sees this is no big deal. She smokes cocaine ever someone gives it to her by her account. This usually works out to a few times a week. She also admits that she continues to drink intermittently which she does not think is a significant problem either. Denies any depression. Denies psychotic symptoms. Totally denies suicidal ideation.  Past psychiatric history: Patient really has never been involved with seeing psychiatry. She says one time many years ago she was referred to a psychiatrist because it was thought  that she was trying to kill herself by overdose on cocaine. She states that at that time she was simply using a lot of cocaine and not trying to kill her self. She has never been compliant or interested in any mental health treatment since then. She was evaluated by Dr. Samuel Jester, our psychologist some years ago for capacity and found to have capacity to make decisions. She does not take any psychiatric medicine. No history of suicide attempts.  Medical history: Patient has a long-standing seizure disorder which she says dates back to her teenage years. She claims that a tumor has been found in her brain. I'm not sure that I can find the demonstration of that. She denies having any other significant ongoing medical problems.  Social history: Patient has applied for disability but has not been awarded it. Has no income. Lives with a boyfriend. Has some family she stays in touch with. Seems content with her social situation.  Family history: Doesn't have any known family history of substance abuse or mental health problems.  Current medications: Patient states that she only gets full control of her seizures when she is taking both Depakote and Dilantin HPI Elements:   Quality:  Ongoing seizure disorder. Ongoing cocaine abuse. Severity:  Potentially severe and life-threatening although the patient seems to think it's worth the risk of continuing to do cocaine.. Timing:  Probably within a couple days prior to admission. Duration:  Chronic problem. Context:  Poor compliance with outpatient treatment.  Past Medical History:  Past Medical History  Diagnosis Date  . Allergy    History  reviewed. No pertinent past surgical history. Family History: History reviewed. No pertinent family history. Social History:  History  Alcohol Use: Not on file     History  Drug Use  . Yes  . Special: Benzodiazepines, Cocaine    History   Social History  . Marital Status: Single    Spouse Name: N/A  . Number of  Children: N/A  . Years of Education: N/A   Social History Main Topics  . Smoking status: Current Every Day Smoker  . Smokeless tobacco: Not on file  . Alcohol Use: Not on file  . Drug Use: Yes    Special: Benzodiazepines, Cocaine  . Sexual Activity: Not on file   Other Topics Concern  . None   Social History Narrative   Additional Social History:                          Allergies:   Allergies  Allergen Reactions  . Flagyl [Metronidazole] Other (See Comments)    "my eyes go back in the back of my head"    Labs:  Results for orders placed or performed during the hospital encounter of 08/21/14 (from the past 48 hour(s))  Phenytoin level, total     Status: Abnormal   Collection Time: 08/21/14  2:07 AM  Result Value Ref Range   Phenytoin Lvl <2.5 (L) 10.0 - 20.0 ug/mL  CBC with Differential     Status: Abnormal   Collection Time: 08/21/14  2:07 AM  Result Value Ref Range   WBC 4.9 3.6 - 11.0 K/uL   RBC 4.26 3.80 - 5.20 MIL/uL   Hemoglobin 13.8 12.0 - 16.0 g/dL   HCT 40.1 35.0 - 47.0 %   MCV 94.3 80.0 - 100.0 fL   MCH 32.4 26.0 - 34.0 pg   MCHC 34.3 32.0 - 36.0 g/dL   RDW 12.6 11.5 - 14.5 %   Platelets 146 (L) 150 - 440 K/uL   Neutrophils Relative % 54 %   Neutro Abs 2.6 1.4 - 6.5 K/uL   Lymphocytes Relative 39 %   Lymphs Abs 1.9 1.0 - 3.6 K/uL   Monocytes Relative 7 %   Monocytes Absolute 0.3 0.2 - 0.9 K/uL   Eosinophils Relative 1 %   Eosinophils Absolute 0.0 0 - 0.7 K/uL   Basophils Relative 1 %   Basophils Absolute 0.0 0 - 0.1 K/uL  Comprehensive metabolic panel     Status: Abnormal   Collection Time: 08/21/14  2:07 AM  Result Value Ref Range   Sodium 141 135 - 145 mmol/L   Potassium 3.5 3.5 - 5.1 mmol/L   Chloride 108 101 - 111 mmol/L   CO2 22 22 - 32 mmol/L   Glucose, Bld 82 65 - 99 mg/dL   BUN 7 6 - 20 mg/dL   Creatinine, Ser 0.56 0.44 - 1.00 mg/dL   Calcium 9.5 8.9 - 10.3 mg/dL   Total Protein 8.1 6.5 - 8.1 g/dL   Albumin 4.5 3.5 - 5.0  g/dL   AST 44 (H) 15 - 41 U/L   ALT 21 14 - 54 U/L   Alkaline Phosphatase 71 38 - 126 U/L   Total Bilirubin 0.4 0.3 - 1.2 mg/dL   GFR calc non Af Amer >60 >60 mL/min   GFR calc Af Amer >60 >60 mL/min    Comment: (NOTE) The eGFR has been calculated using the CKD EPI equation. This calculation has not been validated in all clinical  situations. eGFR's persistently <60 mL/min signify possible Chronic Kidney Disease.    Anion gap 11 5 - 15  Acetaminophen level     Status: Abnormal   Collection Time: 08/21/14  2:07 AM  Result Value Ref Range   Acetaminophen (Tylenol), Serum <10 (L) 10 - 30 ug/mL    Comment:        THERAPEUTIC CONCENTRATIONS VARY SIGNIFICANTLY. A RANGE OF 10-30 ug/mL MAY BE AN EFFECTIVE CONCENTRATION FOR MANY PATIENTS. HOWEVER, SOME ARE BEST TREATED AT CONCENTRATIONS OUTSIDE THIS RANGE. ACETAMINOPHEN CONCENTRATIONS >150 ug/mL AT 4 HOURS AFTER INGESTION AND >50 ug/mL AT 12 HOURS AFTER INGESTION ARE OFTEN ASSOCIATED WITH TOXIC REACTIONS.   Ethanol     Status: Abnormal   Collection Time: 08/21/14  2:07 AM  Result Value Ref Range   Alcohol, Ethyl (B) 268 (H) <5 mg/dL    Comment:        LOWEST DETECTABLE LIMIT FOR SERUM ALCOHOL IS 5 mg/dL FOR MEDICAL PURPOSES ONLY   Salicylate level     Status: None   Collection Time: 08/21/14  2:07 AM  Result Value Ref Range   Salicylate Lvl <6.7 2.8 - 30.0 mg/dL  TSH     Status: None   Collection Time: 08/21/14  2:07 AM  Result Value Ref Range   TSH 1.188 0.350 - 4.500 uIU/mL  Hemoglobin A1c     Status: None   Collection Time: 08/21/14  2:07 AM  Result Value Ref Range   Hgb A1c MFr Bld 5.2 4.0 - 6.0 %  Urine Drug Screen, Qualitative ([facility] only)     Status: Abnormal   Collection Time: 08/21/14  2:39 AM  Result Value Ref Range   Tricyclic, Ur Screen NONE DETECTED NONE DETECTED   Amphetamines, Ur Screen NONE DETECTED NONE DETECTED   MDMA (Ecstasy)Ur Screen NONE DETECTED NONE DETECTED   Cocaine Metabolite,Ur [place] POSITIVE  (A) NONE DETECTED   Opiate, Ur Screen NONE DETECTED NONE DETECTED   Phencyclidine (PCP) Ur S NONE DETECTED NONE DETECTED   Cannabinoid 50 Ng, Ur [place] NONE DETECTED NONE DETECTED   Barbiturates, Ur Screen NONE DETECTED NONE DETECTED   Benzodiazepine, Ur Scrn POSITIVE (A) NONE DETECTED   Methadone Scn, Ur NONE DETECTED NONE DETECTED    Comment: (NOTE) 619  Tricyclics, urine               Cutoff 1000 ng/mL 200  Amphetamines, urine             Cutoff 1000 ng/mL 300  MDMA (Ecstasy), urine           Cutoff 500 ng/mL 400  Cocaine Metabolite, urine       Cutoff 300 ng/mL 500  Opiate, urine                   Cutoff 300 ng/mL 600  Phencyclidine (PCP), urine      Cutoff 25 ng/mL 700  Cannabinoid, urine              Cutoff 50 ng/mL 800  Barbiturates, urine             Cutoff 200 ng/mL 900  Benzodiazepine, urine           Cutoff 200 ng/mL 1000 Methadone, urine                Cutoff 300 ng/mL 1100 1200 The urine drug screen provides only a preliminary, unconfirmed 1300 analytical test result and should not be used for non-medical 1400 purposes. Clinical consideration  and professional judgment should 1500 be applied to any positive drug screen result due to possible 1600 interfering substances. A more specific alternate chemical method 1700 must be used in order to obtain a confirmed analytical result.  1800 Gas chromato graphy / mass spectrometry (GC/MS) is the preferred 1900 confirmatory method.   Urinalysis complete, with microscopic ([facility] only)     Status: Abnormal   Collection Time: 08/21/14  2:39 AM  Result Value Ref Range   Color, Urine COLORLESS (A) YELLOW   APPearance CLEAR (A) CLEAR   Glucose, UA NEGATIVE NEGATIVE mg/dL   Bilirubin Urine NEGATIVE NEGATIVE   Ketones, ur NEGATIVE NEGATIVE mg/dL   Specific Gravity, Urine 1.002 (L) 1.005 - 1.030   Hgb urine dipstick NEGATIVE NEGATIVE   pH 5.0 5.0 - 8.0   Protein, ur NEGATIVE NEGATIVE mg/dL   Nitrite NEGATIVE NEGATIVE   Leukocytes, UA  NEGATIVE NEGATIVE   RBC / HPF 0-5 0 - 5 RBC/hpf   WBC, UA NONE SEEN 0 - 5 WBC/hpf   Bacteria, UA RARE (A) NONE SEEN   Squamous Epithelial / LPF 0-5 (A) NONE SEEN  Troponin I     Status: None   Collection Time: 08/21/14  2:39 AM  Result Value Ref Range   Troponin I <0.03 <0.031 ng/mL    Comment:        NO INDICATION OF MYOCARDIAL INJURY.   Basic metabolic panel     Status: None   Collection Time: 08/22/14  8:35 AM  Result Value Ref Range   Sodium 138 135 - 145 mmol/L   Potassium 3.6 3.5 - 5.1 mmol/L   Chloride 107 101 - 111 mmol/L   CO2 24 22 - 32 mmol/L   Glucose, Bld 98 65 - 99 mg/dL   BUN 6 6 - 20 mg/dL   Creatinine, Ser 0.59 0.44 - 1.00 mg/dL   Calcium 9.0 8.9 - 10.3 mg/dL   GFR calc non Af Amer >60 >60 mL/min   GFR calc Af Amer >60 >60 mL/min    Comment: (NOTE) The eGFR has been calculated using the CKD EPI equation. This calculation has not been validated in all clinical situations. eGFR's persistently <60 mL/min signify possible Chronic Kidney Disease.    Anion gap 7 5 - 15  Magnesium     Status: None   Collection Time: 08/22/14  8:35 AM  Result Value Ref Range   Magnesium 1.8 1.7 - 2.4 mg/dL  CBC     Status: Abnormal   Collection Time: 08/22/14  8:35 AM  Result Value Ref Range   WBC 5.5 3.6 - 11.0 K/uL   RBC 3.93 3.80 - 5.20 MIL/uL   Hemoglobin 12.5 12.0 - 16.0 g/dL   HCT 37.2 35.0 - 47.0 %   MCV 94.8 80.0 - 100.0 fL   MCH 31.9 26.0 - 34.0 pg   MCHC 33.6 32.0 - 36.0 g/dL   RDW 13.3 11.5 - 14.5 %   Platelets 126 (L) 150 - 440 K/uL    Vitals: Blood pressure 125/69, pulse 74, temperature 99.3 F (37.4 C), temperature source Oral, resp. rate 18, height _0  (1.651 m), weight 48.353 kg (106 lb 9.6 oz), SpO2 100 %.  Risk to Self: Is patient at risk for suicide?: No Risk to Others:   Prior Inpatient Therapy:   Prior Outpatient Therapy:    Current Facility-Administered Medications  Medication Dose Route Frequency Provider Last Rate Last Dose  .  acetaminophen (TYLENOL) tablet 650 mg  650 mg Oral  Q6H PRN Nicholes Mango, MD   650 mg at 08/22/14 2039  . divalproex (DEPAKOTE) DR tablet 500 mg  500 mg Oral Q12H Aruna Gouru, MD   500 mg at 08/22/14 2040  . heparin injection 5,000 Units  5,000 Units Subcutaneous 3 times per day Harrie Foreman, MD   5,000 Units at 08/22/14 2040  . ondansetron (ZOFRAN) tablet 4 mg  4 mg Oral Q6H PRN Harrie Foreman, MD       Or  . ondansetron [facility]) injection 4 mg  4 mg Intravenous Q6H PRN Harrie Foreman, MD      . phenytoin (DILANTIN) ER capsule 100 mg  100 mg Oral TID Nicholes Mango, MD   100 mg at 08/22/14 2040    Musculoskeletal: Strength & Muscle Tone: within normal limits Gait & Station: normal Patient leans: N/A  Psychiatric Specialty Exam: Physical Exam  Constitutional: She appears well-developed and well-nourished.  HENT:  Head: Normocephalic and atraumatic.  Eyes: Conjunctivae are normal. Pupils are equal, round, and reactive to light.  Neck: Normal range of motion.  Cardiovascular: Normal heart sounds.   Respiratory: Effort normal.  GI: Soft.  Musculoskeletal: Normal range of motion.  Neurological: She is alert.  Skin: Skin is warm and dry.  Psychiatric: Her speech is normal. Thought content normal. Her affect is labile. She is agitated. Cognition and memory are impaired. She expresses impulsivity.    Review of Systems  Constitutional: Negative.   HENT: Negative.   Eyes: Negative.   Respiratory: Negative.   Cardiovascular: Negative.   Gastrointestinal: Negative.   Musculoskeletal: Negative.   Skin: Negative.   Neurological: Negative.   Psychiatric/Behavioral: Positive for substance abuse. Negative for depression, suicidal ideas, hallucinations and memory loss. The patient is not nervous/anxious and does not have insomnia.     Blood pressure 125/69, pulse 74, temperature 99.3 F (37.4 C), temperature source Oral, resp. rate 18, height _0  (1.651 m), weight 48.353 kg (106 lb  9.6 oz), SpO2 100 %.Body mass index is 17.74 kg/(m^2).  General Appearance: Casual  Eye Contact::  Good  Speech:  Clear and Coherent  Volume:  Increased  Mood:  Irritable  Affect:  Labile  Thought Process:  Goal Directed  Orientation:  Full (Time, Place, and Person)  Thought Content:  Negative  Suicidal Thoughts:  No  Homicidal Thoughts:  No  Memory:  Immediate;   Good Recent;   Fair Remote;   Fair  Judgement:  Impaired  Insight:  Lacking  Psychomotor Activity:  Normal  Concentration:  Fair  Recall:  Poor  Fund of Knowledge:Poor  Language: Fair  Akathisia:  No  Handed:  Right  AIMS (if indicated):     Assets:  Resilience Social Support  ADL's:  Intact  Cognition: WNL  Sleep:      Medical Decision Making: Review of Psycho-Social Stressors (1), Review or order clinical lab tests (1), Established Problem, Worsening (2), Review of Last Therapy Session (1) and Review of Medication Regimen & Side Effects (2)  Treatment Plan Summary: Plan Patient currently is denying any symptoms of mood disorder. Does not have any symptoms or presentation of psychosis. She steadfastly refuses to acknowledge that her cocaine is related to her seizure disorder. States that she likes to use cocaine and has no intention of stopping and sees it as not being a problem. Patient does on the other hand states that she know she needs to take Dilantin and Depakote and very much wants to be back on her  medicine. At this point there is no indication to try and force any sort of treatment for her substance abuse. The patient clearly has no intention of stopping or even a belief that she has a problem. No other psychiatric problem that can be acutely addressed. Supportive counseling completed. Patient has been taken off involuntary commitment as she does not meet any commitment criteria. Otherwise I will sign off  Plan:  Patient does not meet criteria for psychiatric inpatient admission. Supportive therapy provided  about ongoing stressors. Disposition: Conservation officer, nature. Discontinue IVC. No further psychiatric intervention at this point.  Glenora Morocho 08/22/2014 8:51 PM

## 2014-08-22 NOTE — Consult Note (Signed)
CC: seizure  HPI: [name] is an 49 y.o. female presenting with seizure activity. Pt was not taking his anti epileptic medications prior to coming into hospital. At 13:30 today apparently had another generalized sz lastint 30 seconds.    Past Medical History  Diagnosis Date  . Allergy     History reviewed. No pertinent past surgical history.  History reviewed. No pertinent family history.  Social History:  reports that she has been smoking.  She does not have any smokeless tobacco history on file. She reports that she uses illicit drugs (Benzodiazepines and Cocaine). Her alcohol history is not on file.  Allergies  Allergen Reactions  . Flagyl [Metronidazole] Other (See Comments)    "my eyes go back in the back of my head"      Physical Examination: Blood pressure 125/69, pulse 74, temperature 99.3 F (37.4 C), temperature source Oral, resp. rate 18, height 5\' 5"  (1.651 m), weight 48.353 kg (106 lb 9.6 oz), SpO2 100 %.  HEENT-  Normocephalic, no lesions, without obvious abnormality.  Normal external eye and conjunctiva.  Normal TM's bilaterally.  Normal auditory canals and external ears. Normal external nose, mucus membranes and septum.  Normal pharynx.  Mental Status: Alert, oriented, thought content appropriate.  Speech fluent without evidence of aphasia.  Able to follow 3 step commands without difficulty. Cranial Nerves: II: Discs flat bilaterally; Visual fields grossly normal, pupils equal, round, reactive to light and accommodation III,IV, VI: ptosis not present, extra-ocular motions intact bilaterally V,VII: smile symmetric, facial light touch sensation normal bilaterally VIII: hearing normal bilaterally IX,X: gag reflex present XI: bilateral shoulder shrug XII: midline tongue extension Motor: Right : Upper extremity   4/5    Left:     Upper extremity   4/5  Lower extremity   4/5     Lower extremity   4/5 Tone and bulk:normal tone throughout; no atrophy  noted Sensory: Pinprick and light touch intact throughout, bilaterally Deep Tendon Reflexes: 1+ and symmetric throughout Plantars: Right: downgoing   Left: downgoing Cerebellar: normal finger-to-nose, normal rapid alternating movements and normal heel-to-shin test Gait: not tested     Laboratory Studies:   Basic Metabolic Panel:  Recent Labs Lab 08/21/14 0207 08/22/14 0835  NA 141 138  K 3.5 3.6  CL 108 107  CO2 22 24  GLUCOSE 82 98  BUN 7 6  CREATININE 0.56 0.59  CALCIUM 9.5 9.0  MG  --  1.8    Liver Function Tests:  Recent Labs Lab 08/21/14 0207  AST 44*  ALT 21  ALKPHOS 71  BILITOT 0.4  PROT 8.1  ALBUMIN 4.5   No results for input(s): LIPASE, AMYLASE in the last 168 hours. No results for input(s): AMMONIA in the last 168 hours.  CBC:  Recent Labs Lab 08/21/14 0207 08/22/14 0835  WBC 4.9 5.5  NEUTROABS 2.6  --   HGB 13.8 12.5  HCT 40.1 37.2  MCV 94.3 94.8  PLT 146* 126*    Cardiac Enzymes:  Recent Labs Lab 08/21/14 0239  TROPONINI <0.03    BNP: Invalid input(s): POCBNP  CBG: No results for input(s): GLUCAP in the last 168 hours.  Microbiology: Results for orders placed or performed in visit on 02/01/13  Urine culture     Status: None   Collection Time: 02/02/13 12:03 PM  Result Value Ref Range Status   Micro Text Report   Final       SOURCE: CLEAN CATCH    COMMENT  NO GROWTH IN 18-24 HOURS   ANTIBIOTIC                                                        Coagulation Studies: No results for input(s): LABPROT, INR in the last 72 hours.  Urinalysis:  Recent Labs Lab 08/21/14 0239  COLORURINE COLORLESS*  LABSPEC 1.002*  PHURINE 5.0  GLUCOSEU NEGATIVE  HGBUR NEGATIVE  BILIRUBINUR NEGATIVE  KETONESUR NEGATIVE  PROTEINUR NEGATIVE  NITRITE NEGATIVE  LEUKOCYTESUR NEGATIVE    Lipid Panel:  No results found for: CHOL, TRIG, HDL, CHOLHDL, VLDL, LDLCALC  HgbA1C:  Lab Results  Component Value Date    HGBA1C 5.2 08/21/2014    Urine Drug Screen:     Component Value Date/Time   LABOPIA NONE DETECTED 08/21/2014 0239   LABBENZ POSITIVE* 08/21/2014 0239   AMPHETMU NONE DETECTED 08/21/2014 0239   THCU NONE DETECTED 08/21/2014 0239   LABBARB NONE DETECTED 08/21/2014 0239    Alcohol Level:  Recent Labs Lab 08/21/14 0207  ETH 268*    Imaging: Ct Head Wo Contrast  08/21/2014   CLINICAL DATA:  Slurred speech, chest pain. Poor historian. History of seizures.  EXAM: CT HEAD WITHOUT CONTRAST  TECHNIQUE: Contiguous axial images were obtained from the base of the skull through the vertex without intravenous contrast.  COMPARISON:  MRI brain February 01, 2013  FINDINGS: The ventricles and sulci are normal. No intraparenchymal hemorrhage, mass effect nor midline shift. No acute large vascular territory infarcts.  No abnormal extra-axial fluid collections. Basal cisterns are patent.  No skull fracture. The included ocular globes and orbital contents are non-suspicious. The mastoid aircells and included paranasal sinuses are well-aerated.  IMPRESSION: No acute intracranial process; stable appearance of the head from February 01, 2013 given differences in imaging technique.   Electronically Signed   By: Awilda Metro M.D.   On: 08/21/2014 04:20     Assessment/Plan:  49 y.o. female presenting with seizure activity. Pt was not taking his anti epileptic medications prior to coming into hospital. At 13:30 today apparently had another generalized sz lastint 30 seconds.    Dilantin restarted and VPA  - Dilantin and VPA both work via p450 system in liver. Dilantin is inducer and VPA is inhibitor meaning they work against each other.  Dilantin decreases VPA level while VPA can increase dilantin level.  At the same time pt was not compliant with seizure medications - will check dilantin level in AM - load 300 IV dilantin now - would like to switch dilantin to Vimpat as not convinced pt will be compliant  with it and it is difficult to obtain vimpat level besides being expensive - No further imaging at this point. [name]  08/22/2014, 4:05 PM

## 2014-08-22 NOTE — Progress Notes (Addendum)
Called  To pts room per sitter.  Pt reported  She "think I'm having a seizure".I observerd pt  To be  Cry moaning with arms  Held tightly against body and hands into 3/4 closed fists. Iasked pt questions and she would answer me verbally. Upper body was moving side to side . Lower body straight and still. Followed my command to look upward  To check pupil. Both equal. Dr Amado Coegouru notified  Of this episode and  Was told to call dr clapacs who would see her. Dr clapacs  Then was  Notified and I informed him of pts episode.no nwe orders given. Will cont to moniter pt. [email or web address]

## 2014-08-22 NOTE — Care Management (Signed)
Admitted to [facility] with the diagnosis of seizures. Lives with boyfriend, [name], [phone]) States she has been to [facility] in the past. Never been to the [facility].  Applications for [facility] and [facility] given to Ms. Oddo. States she did have Medicaid in the past. Financial Patient Counselor will see this case. IVC in place. Sitter at the bedside. [name] RN MSN Care Management [phone]

## 2014-08-22 NOTE — Plan of Care (Signed)
Problem: Discharge Progression Outcomes Goal: Other Discharge Outcomes/Goals Plan of care progress to goal: Pain: denies pain Hemodynamically: VSS Diet: tolerating diet Activity: 1 assist up to bathroom Continues 1:1 sitter for IVC

## 2014-08-23 LAB — CBC
HCT: 37.1 % (ref 35.0–47.0)
Hemoglobin: 12.5 g/dL (ref 12.0–16.0)
MCH: 31.9 pg (ref 26.0–34.0)
MCHC: 33.6 g/dL (ref 32.0–36.0)
MCV: 95 fL (ref 80.0–100.0)
PLATELETS: 124 10*3/uL — AB (ref 150–440)
RBC: 3.91 MIL/uL (ref 3.80–5.20)
RDW: 13.2 % (ref 11.5–14.5)
WBC: 5.4 10*3/uL (ref 3.6–11.0)

## 2014-08-23 LAB — BASIC METABOLIC PANEL
ANION GAP: 7 (ref 5–15)
CALCIUM: 9.2 mg/dL (ref 8.9–10.3)
CHLORIDE: 106 mmol/L (ref 101–111)
CO2: 26 mmol/L (ref 22–32)
Creatinine, Ser: 0.56 mg/dL (ref 0.44–1.00)
GFR calc Af Amer: >60 mL/min (ref >60)
GFR calc non Af Amer: >60 mL/min (ref >60)
Glucose, Bld: 105 mg/dL — ABNORMAL HIGH (ref 65–99)
POTASSIUM: 3.8 mmol/L (ref 3.5–5.1)
Sodium: 139 mmol/L (ref 135–145)

## 2014-08-23 LAB — PHENYTOIN LEVEL, TOTAL: PHENYTOIN LVL: 7.7 ug/mL — AB (ref 10.0–20.0)

## 2014-08-23 MED ORDER — PHENYTOIN SODIUM EXTENDED 100 MG PO CAPS: 300.0000 mg | ORAL_CAPSULE | Freq: Every day | ORAL | Status: DC

## 2014-08-23 MED ORDER — DIVALPROEX SODIUM 500 MG PO DR TAB: 500.0000 mg | DELAYED_RELEASE_TABLET | Freq: Two times a day (BID) | ORAL | Status: DC

## 2014-08-23 NOTE — Care Management (Signed)
Physical therapy evaluation completed. Recommends home with home health/physical therapy/rolling walker. Rolling Walker given to [name] per [facility]. [name] has no insurance. States she is not able to pay for home health services at this time. D[facility]ed [name]. Discharge to home today. Boyfriend, [name] will transport. [name] RN MSN Care Management [phone]

## 2014-08-23 NOTE — Discharge Summary (Signed)
[facility] [place] at [facility]   PATIENT NAME: [name]    MR#:  [id number]  DATE OF BIRTH:  [birth year]  DATE OF ADMISSION:  08/21/2014 ADMITTING PHYSICIAN: Arnaldo Natal, MD  DATE OF DISCHARGE:08/23/2014 PRIMARY CARE PHYSICIAN: No primary care provider on file.    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 Allergy     HOSPITAL COURSE:   This is a 49 year old African female admitted for seizure disorder and polysubstance abuse. 1. Seizure disorder: The patient has been loaded with Dilantin. Will continue antiseizure medication Dilantin 300 mg once daily and Depakote for mood stabilization. She had IV hydration during the hospital course. She has a history of noncompliance which is obviously lead to uncontrolled seizure activity. Substance abuse has also contributed to her lowered seizure threshold but patient is not willing to stop using cocaine and other substances   2. 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 Toni Amend, who discontinued her IVC. Seen by Dr. Gwen Pounds regarding ventricular pauses, recommended to stop using cocaine and other  Substances, not recommending any cardiac interventions at this time  3.. Generalized weakness-PT consult 3. DVT prophylaxis: Heparin 4. GI prophylaxis: None  DISCHARGE CONDITIONS:   Fair  CONSULTS OBTAINED:  Treatment Team:  Beau Fanny, MD Lamar Blinks, MD [name],  MD Audery Amel, MD   PROCEDURES none  DRUG ALLERGIES:   Allergies  Allergen Reactions  . Flagyl [Metronidazole] Other (See Comments)    "my eyes go back in the back of my head"    DISCHARGE MEDICATIONS:   Current Discharge Medication List    START taking these medications   Details  divalproex (DEPAKOTE) 500 MG DR tablet Take 1 tablet (500 mg total) by mouth every 12 (twelve) hours. Qty: 60 tablet, Refills: 0    phenytoin (DILANTIN) 100 MG ER capsule Take 3 capsules (300 mg total) by mouth daily. Qty: 30 capsule, Refills: 0      CONTINUE these medications which have NOT CHANGED   Details  ibuprofen (ADVIL,MOTRIN) 800 MG tablet Take 1 tablet (800 mg total) by mouth every 8 (eight) hours as needed. Qty: 30 tablet, Refills: 0    oxyCODONE-acetaminophen (ROXICET) 5-325 MG per tablet Take 1-2 tablets by mouth every 4 (four) hours as needed for severe pain. Qty: 15 tablet, Refills: 0         DISCHARGE INSTRUCTIONS:   Follow-[facility] Follow-up with neurology as scheduled or sooner as needed Follow-up with cardiology Dr. Gwen Pounds in 4-6 weeks or as needed  DIET:  Regular diet  DISCHARGE CONDITION:  Fair  ACTIVITY:  Activity as tolerated with walker as per PT recommendations  OXYGEN:  Home Oxygen: No.   Oxygen Delivery: room air  DISCHARGE LOCATION:  home   If you experience worsening of your admission symptoms, develop shortness of breath, life threatening emergency, suicidal or homicidal thoughts you must seek medical attention immediately by calling 911 or calling your MD immediately  if symptoms less  severe.  You Must read complete instructions/literature along with all the possible adverse reactions/side effects for all the Medicines you take and that have been prescribed to you. Take any new Medicines after you have completely understood and accpet all the possible adverse reactions/side effects.    Please note  You were cared for by a hospitalist during your hospital stay. If you have any questions about your discharge medications or the care you received while you were in the hospital after you are discharged, you can call the unit and asked to speak with the hospitalist on call if the hospitalist that took care of you is not available. Once you are discharged, your primary care physician will handle any further medical issues. Please note that NO REFILLS for any discharge medications will be authorized once you are discharged, as it is imperative that you return to your primary care physician (or establish a relationship with a primary care physician if you do not have one) for your aftercare needs so that they can reassess your need for medications and monitor your lab values.     Today  Chief Complaint  Patient presents with  . Altered Mental Status   Patient is resting comfortably with no complaints. No other episodes of ventricular pauses noticed. No other episodes of seizures. Wants to go home  ROS: None CONSTITUTIONAL: Denies fevers, chills. Denies any fatigue, weakness.  EYES: Denies blurry vision, double vision, eye pain. EARS, NOSE, THROAT: Denies tinnitus, ear pain, hearing loss. RESPIRATORY: Denies cough, wheeze, shortness of breath.  CARDIOVASCULAR: Denies chest pain, palpitations, edema.  GASTROINTESTINAL: Denies nausea, vomiting, diarrhea, abdominal pain. Denies bright red blood per rectum. GENITOURINARY: Denies dysuria, hematuria. ENDOCRINE: Denies nocturia or thyroid problems. HEMATOLOGIC AND LYMPHATIC: Denies easy bruising or bleeding. SKIN: Denies rash or lesion. MUSCULOSKELETAL: Denies pain in neck, back, shoulder, knees, hips or arthritic symptoms.  NEUROLOGIC: Denies paralysis, paresthesias.  PSYCHIATRIC: Denies anxiety or depressive symptoms.   VITAL SIGNS:  Blood pressure 102/59, pulse 72, temperature 98.1 F (36.7 C), temperature source Oral, resp.  rate 20, height  (1.651 m), weight 45.768 kg (100 lb 14.4 oz), SpO2 98 %.  I/O:   Intake/Output Summary (Last 24 hours) at 08/23/14 1050 Last data filed at 08/23/14 0900  Gross per 24 hour  Intake   1080 ml  Output   1700 ml  Net   -620 ml    PHYSICAL EXAMINATION:  GENERAL:  49 y.o.-year-old patient lying in the bed with no acute distress.  EYES: Pupils equal, round, reactive to light and accommodation. No scleral icterus. Extraocular muscles intact.  HEENT: Head atraumatic, normocephalic. Oropharynx and nasopharynx clear.  NECK:  Supple, no jugular venous distention. No thyroid enlargement, no tenderness.  LUNGS: Normal breath sounds bilaterally, no wheezing, rales,rhonchi or crepitation. No use of accessory muscles of respiration.  CARDIOVASCULAR: S1, S2 normal. No murmurs, rubs, or gallops.  ABDOMEN: Soft, non-tender, non-distended. Bowel sounds present. No organomegaly or mass.  EXTREMITIES: No pedal edema, cyanosis, or clubbing.  NEUROLOGIC: Cranial nerves II through XII are intact. . Sensation intact. Gait not checked.  PSYCHIATRIC: The patient is alert and oriented x 3.  SKIN: No obvious rash, lesion, or ulcer.   DATA REVIEW:   CBC  Recent Labs Lab 08/23/14 0455  WBC 5.4  HGB 12.5  HCT 37.1  PLT 124*    Chemistries   Recent Labs Lab 08/21/14 0207 08/22/14 0835 08/23/14 0455  NA 141 138 139  K 3.5 3.6 3.8  CL  108 107 106  CO2 22 24 26   GLUCOSE 82 98 105*  BUN 7 6 <5*  CREATININE 0.56 0.59 0.56  CALCIUM 9.5 9.0 9.2  MG  --  1.8  --   AST 44*  --   --   ALT 21  --   --   ALKPHOS 71  --   --   BILITOT 0.4  --   --     Cardiac Enzymes  Recent Labs Lab 08/21/14 0239  TROPONINI <0.03    Microbiology Results  Results for orders placed or performed in visit on 02/01/13  Urine culture     Status: None   Collection Time: 02/02/13 12:03 PM  Result Value Ref Range Status   Micro Text Report   Final       SOURCE: CLEAN CATCH    COMMENT                    NO GROWTH IN 18-24 HOURS   ANTIBIOTIC                                                        RADIOLOGY:  Ct Head Wo Contrast  08/21/2014   CLINICAL DATA:  Slurred speech, chest pain. Poor historian. History of seizures.  EXAM: CT HEAD WITHOUT CONTRAST  TECHNIQUE: Contiguous axial images were obtained from the base of the skull through the vertex without intravenous contrast.  COMPARISON:  MRI brain February 01, 2013  FINDINGS: The ventricles and sulci are normal. No intraparenchymal hemorrhage, mass effect nor midline shift. No acute large vascular territory infarcts.  No abnormal extra-axial fluid collections. Basal cisterns are patent.  No skull fracture. The included ocular globes and orbital contents are non-suspicious. The mastoid aircells and included paranasal sinuses are well-aerated.  IMPRESSION: No acute intracranial process; stable appearance of the head from February 01, 2013 given differences in imaging technique.   Electronically Signed   By: Awilda Metroourtnay  Bloomer M.D.   On: 08/21/2014 04:20    EKG:   Orders placed or performed during the hospital encounter of 08/21/14  . EKG 12-Lead  . EKG 12-Lead  . EKG 12-Lead  . EKG 12-Lead      Management plans discussed with the patient, family and they are in agreement.  CODE STATUS:     Code Status Orders        Start     Ordered   08/21/14 0757  Full code   Continuous     08/21/14 0756      TOTAL TIME TAKING CARE OF THIS PATIENT, coordination of care, discussing with the patient, RN, other specialists including Dr. Lonia MadZylikman and Dr. Toni Amendclapacs: 45minutes.    @[facility] @  on 08/23/2014 at 10:50 AM  Between 7am to 6pm - Pager - [phone]  After 6pm go to [email or web address] - password [email or web address] [facility]  [facility]  Office  [phone]  CC: Primary care physician; No primary care provider on file.

## 2014-08-23 NOTE — Discharge Instructions (Signed)
Activity as tolerated as recommended by physical therapy. Encouraged to use walker Follow-up with [facility] at [facility] in 3 days Follow-up with neurology in a week or as needed Diet regular

## 2014-08-23 NOTE — Plan of Care (Signed)
Problem: Discharge Progression Outcomes Goal: Other Discharge Outcomes/Goals Plan of care progress to goal: Pain: denies pain Hemodynamically: BP 128/78 mmHg  Pulse 65  Temp(Src) 98.3 F (36.8 C) (Oral)  Resp 18  Ht  (1.651 m)  Wt 106 lb 9.6 oz (48.353 kg)  BMI 17.74 kg/m2  SpO2 100% Diet: tolerating diet Activity: patient using bedpan unassisted 1:1 sitter for IVC d/c this shift

## 2014-08-23 NOTE — Progress Notes (Signed)
[facility]  Patient: [name] / Admit Date: 08/21/2014 / Date of Encounter: 08/23/2014, 8:18 AM   Subjective: No evidence of any symptoms last night. Patient has had difficulty with walking  Review of Systems: Positive for: Difficulty with walking Negative for: Vision change, hearing change, syncope, dizziness, nausea, vomiting,diarrhea, bloody stool, stomach pain, cough, congestion, diaphoresis, urinary frequency, urinary pain,skin lesions, skin rashes Others previously listed  Objective: Telemetry: Normal sinus rhythm with significant AV block at midnight last night of unknown etiology Physical Exam: Blood pressure 102/59, pulse 72, temperature 98.1 F (36.7 C), temperature source Oral, resp. rate 20, height 5\' 5"  (1.651 m), weight 100 lb 14.4 oz (45.768 kg), SpO2 98 %. Body mass index is 16.79 kg/(m^2). General: Well developed, well nourished, in no acute distress. Head: Normocephalic, atraumatic, sclera non-icteric, no xanthomas, nares are without discharge. Neck: No apparent masses Lungs: Normal respirations with no wheezes, no rhonchi, no rales , no crackles   Heart: Regular rate and rhythm, normal S1 S2, no murmur, no rub, no gallop, PMI is normal size and placement, carotid upstroke normal without bruit, jugular venous pressure normal Abdomen: Soft, non-tender, non-distended with normoactive bowel sounds. No hepatosplenomegaly. Abdominal aorta is normal size without bruit Extremities: No edema, no clubbing, no cyanosis, no ulcers,  Peripheral: 2+ radial, 2+ femoral, 2+ dorsal pedal pulses Neuro: Alert and oriented. Moves all extremities spontaneously. Psych:  Responds to questions appropriately with a normal affect.   Intake/Output Summary (Last 24 hours) at 08/23/14 0818 Last data filed at 08/22/14 2151  Gross per 24 hour  Intake    960 ml  Output   1700 ml  Net   -740 ml    Inpatient Medications:  . divalproex  500 mg Oral  Q12H  . heparin  5,000 Units Subcutaneous 3 times per day  . phenytoin  100 mg Oral TID   Infusions:    Labs:  Recent Labs  08/22/14 0835 08/23/14 0455  NA 138 139  K 3.6 3.8  CL 107 106  CO2 24 26  GLUCOSE 98 105*  BUN 6 <5*  CREATININE 0.59 0.56  CALCIUM 9.0 9.2  MG 1.8  --     Recent Labs  08/21/14 0207  AST 44*  ALT 21  ALKPHOS 71  BILITOT 0.4  PROT 8.1  ALBUMIN 4.5    Recent Labs  08/21/14 0207 08/22/14 0835 08/23/14 0455  WBC 4.9 5.5 5.4  NEUTROABS 2.6  --   --   HGB 13.8 12.5 12.5  HCT 40.1 37.2 37.1  MCV 94.3 94.8 95.0  PLT 146* 126* 124*    Recent Labs  08/21/14 0239  TROPONINI <0.03   Invalid input(s): POCBNP  Recent Labs  08/21/14 0207  HGBA1C 5.2     Weights: Filed Weights   08/21/14 0209 08/22/14 0608 08/23/14 0500  Weight: 110 lb (49.896 kg) 106 lb 9.6 oz (48.353 kg) 100 lb 14.4 oz (45.768 kg)     Radiology/Studies:  Ct Head Wo Contrast  08/21/2014   CLINICAL DATA:  Slurred speech, chest pain. Poor historian. History of seizures.  EXAM: CT HEAD WITHOUT CONTRAST  TECHNIQUE: Contiguous axial images were obtained from the base of the skull through the vertex without intravenous contrast.  COMPARISON:  MRI brain February 01, 2013  FINDINGS: The ventricles and sulci are normal. No intraparenchymal hemorrhage, mass effect nor midline shift. No acute large vascular territory infarcts.  No abnormal extra-axial fluid collections. Basal cisterns are patent.  No skull fracture. The included ocular globes and orbital contents are non-suspicious. The mastoid aircells and included paranasal sinuses are well-aerated.  IMPRESSION: No acute intracranial process; stable appearance of the head from February 01, 2013 given differences in imaging technique.   Electronically Signed   By: Awilda Metro M.D.   On: 08/21/2014 04:20     Assessment and Recommendation  49 y.o. female with known seizure disorder and significant cocaine and cannabis use  with episodes of significant probable vagal response both at night and with pain yesterday causing AV block and ventricular pauses no evidence of significant symptoms or syncope. He are quite concerned about the patient's compliance and use of restricted drugs as to the need for possible pacemaker placement. It would be important to have a correlation between possible syncope and or other symptoms with these episodes listed above before consideration of pacemaker placement 1. Continue increasing rehabilitation and ambulation following closely for further significant episodes of correlated heart block with either seizure disorder and/or other symptoms prior to the potential of pacemaker placement 2. No further cardiac diagnostics necessary at this time 3. Further follow-up with outpatient if patient to be discharged today  Signed, Arnoldo Hooker M.D. FACC

## 2014-08-23 NOTE — Evaluation (Addendum)
Physical Therapy Evaluation Patient Details Name: [name] MRN: [id number] DOB: 1966-01-25 Today's Date: 08/23/2014   History of Present Illness  Patient is a 49 y/o female that presents with a long history of seizures and was admitted after a seizure over the weekend. Patient has a long history of cocaine abuse.   Clinical Impression  Patient is a 49 y/o female that presents after a seizure and subsequent cardiac abnormalities on telemetry. Patient is a poor historian likely a result of her prolonged cocaine use and does not provide a great history of her mobility. It appears she stays at home mainly, with a "friend" at home with her. She has 13 steps and unilateral railing to enter. She displays high falls risk throughout this session (modified DGI 8/12, 5x sit to stand 47 seconds), however no loss of balance noted. She is mod I with bed mobility and requires no physical assistance to transfer OOB, ambulate, or ascend/descend 13 steps with unilateral railing. Patient appears physically able to ambulate and negotiate her home environment but would benefit from 24/7 mobility assistance and HHPT to increase her safety and independence with mobility. Skilled acute PT services are indicated at this time to address her mobility deficits.     Follow Up Recommendations Home health PT    Equipment Recommendations  Rolling walker with 5" wheels    Recommendations for Other Services       Precautions / Restrictions Precautions Precautions: Fall (Seizure ) Restrictions Weight Bearing Restrictions: No      Mobility  Bed Mobility Overal bed mobility: Modified Independent             General bed mobility comments: HOB elevated but did not use hand rails.   Transfers Overall transfer level: Needs assistance Equipment used: Rolling walker (2 wheeled) Transfers: Sit to/from Stand Sit to Stand: Supervision         General transfer comment: Patient requires prolonged time to arise  to standing secondary to LE pain/weakness.   Ambulation/Gait Ambulation/Gait assistance: Supervision Ambulation Distance (Feet): 225 Feet Assistive device: Rolling walker (2 wheeled) Gait Pattern/deviations: Decreased step length - left;Decreased step length - right;Drifts right/left   Gait velocity interpretation: Below normal speed for age/gender General Gait Details: Minimal foot clearance secondary to LE weakness/pain. No loss of balance noted, however very slow gait speed.   Stairs Stairs: Yes Stairs assistance: Supervision Stair Management: One rail Left Number of Stairs: 15 General stair comments: Patient required vc's for getting to the steps safely with RW and transferring hands from walker to steps. Patient used both hands on one rail to ascend in step-to fashion.   Wheelchair Mobility    Modified Rankin (Stroke Patients Only)       Balance Overall balance assessment: Needs assistance Sitting-balance support: Feet unsupported Sitting balance-Leahy Scale: Good     Standing balance support: Bilateral upper extremity supported Standing balance-Leahy Scale: Good Standing balance comment: Patient displays modified DGI score of 8/12 indicating she is at high falls risk. She is able to complete standing marching with RW and no loss of balance.                  Standardized Balance Assessment Standardized Balance Assessment :  (5x sit to stand 47 seconds. )           Pertinent Vitals/Pain Pain Assessment:  (Patient complains of her left hip flexors and lower back hurting but does not formally rate. )    Home Living Family/patient expects to  be discharged to:: Private residence Living Arrangements: Spouse/significant other ("Friend") Available Help at Discharge: Friend(s) Type of Home: Apartment Home Access: Stairs to enter Entrance Stairs-Rails: Left Entrance Stairs-Number of Steps: 14 Home Layout:  (2nd floor 1 bedroom apartment)        Prior  Function Level of Independence: Independent with assistive device(s)         Comments: Poor historian it appears she was using a walker, which has now broke?      Hand Dominance        Extremity/Trunk Assessment   Upper Extremity Assessment: Overall WFL for tasks assessed           Lower Extremity Assessment: Overall WFL for tasks assessed      Cervical / Trunk Assessment: Normal  Communication   Communication: No difficulties  Cognition Arousal/Alertness: Awake/alert Behavior During Therapy: WFL for tasks assessed/performed;Anxious Overall Cognitive Status: Within Functional Limits for tasks assessed                      General Comments      Exercises   Sit to stand x 8, Standing hip marches x 15 bilaterally with HHA, SLRs x 15 bilaterally. Exercise handout provided.       Assessment/Plan    PT Assessment Patient needs continued PT services  PT Diagnosis Difficulty walking   PT Problem List Decreased strength;Pain;Decreased knowledge of use of DME;Decreased safety awareness;Decreased balance;Decreased mobility  PT Treatment Interventions DME instruction;Gait training;Stair training;Therapeutic activities;Therapeutic exercise;Balance training   PT Goals (Current goals can be found in the Care Plan section) Acute Rehab PT Goals Patient Stated Goal: To return home safely.  PT Goal Formulation: With patient Time For Goal Achievement: 09/06/14 Potential to Achieve Goals: Good    Frequency Min 2X/week   Barriers to discharge Decreased caregiver support;Inaccessible home environment Patient will not have 24/7 assistance. 13 steps to enter.     Co-evaluation               End of Session Equipment Utilized During Treatment: Gait belt Activity Tolerance: Patient tolerated treatment well;Patient limited by fatigue Patient left: in bed;with call bell/phone within reach;with bed alarm set Nurse Communication: Mobility status    Functional  Assessment Tool Used: 5x sit to stand, modified DGI, clinical judgement  Functional Limitation: Mobility: Walking and moving around Mobility: Walking and Moving Around Current Status ([id number]): At least 20 percent but less than 40 percent impaired, limited or restricted Mobility: Walking and Moving Around Goal Status [phone]([id number]): At least 20 percent but less than 40 percent impaired, limited or restricted    Time: 1051-1119 PT Time Calculation (min) (ACUTE ONLY): 28 min   Charges:   PT Evaluation $Initial PT Evaluation Tier I: 1 Procedure PT Treatments $Therapeutic Exercise: 8-22 mins   PT G Codes:   PT G-Codes **NOT FOR INPATIENT CLASS** Functional Assessment Tool Used: 5x sit to stand, modified DGI, clinical judgement  Functional Limitation: Mobility: Walking and moving around Mobility: Walking and Moving Around Current Status ([phone]): At least 20 percent but less than 40 percent impaired, limited or restricted Mobility: Walking and Moving Around Goal Status [phone](G8979): At least 20 percent but less than 40 percent impaired, limited or restricted   Kerin RansomPatrick A McNamara, PT, DPT    08/23/2014, 11:36 AM

## 2014-08-31 ENCOUNTER — Ambulatory Visit: Payer: Self-pay

## 2014-09-12 ENCOUNTER — Emergency Department
Admission: EM | Admit: 2014-09-12 | Discharge: 2014-09-13 | Disposition: A | Discharge status: Home / Self Care | Payer: Self-pay | Attending: Emergency Medicine | Admitting: Emergency Medicine

## 2014-09-12 ENCOUNTER — Encounter: Payer: Self-pay | Admitting: Emergency Medicine

## 2014-09-12 ENCOUNTER — Emergency Department: Payer: Self-pay

## 2014-09-12 DIAGNOSIS — Z720 Tobacco use: Secondary | ICD-10-CM | POA: Insufficient documentation

## 2014-09-12 DIAGNOSIS — G40909 Epilepsy, unspecified, not intractable, without status epilepticus: Secondary | ICD-10-CM | POA: Insufficient documentation

## 2014-09-12 DIAGNOSIS — Z79899 Other long term (current) drug therapy: Secondary | ICD-10-CM | POA: Insufficient documentation

## 2014-09-12 DIAGNOSIS — R569 Unspecified convulsions: Secondary | ICD-10-CM

## 2014-09-12 DIAGNOSIS — R51 Headache: Secondary | ICD-10-CM | POA: Insufficient documentation

## 2014-09-12 HISTORY — DX: Unspecified convulsions: R56.9

## 2014-09-12 LAB — BASIC METABOLIC PANEL
ANION GAP: 10 (ref 5–15)
BUN: 7 mg/dL (ref 6–20)
CALCIUM: 9.4 mg/dL (ref 8.9–10.3)
CHLORIDE: 105 mmol/L (ref 101–111)
CO2: 25 mmol/L (ref 22–32)
Creatinine, Ser: 0.59 mg/dL (ref 0.44–1.00)
GFR calc Af Amer: >60 mL/min (ref >60)
GFR calc non Af Amer: >60 mL/min (ref >60)
Glucose, Bld: 83 mg/dL (ref 65–99)
Potassium: 3.5 mmol/L (ref 3.5–5.1)
SODIUM: 140 mmol/L (ref 135–145)

## 2014-09-12 LAB — CBC WITH DIFFERENTIAL/PLATELET
Basophils Absolute: 0 10*3/uL (ref 0–0.1)
Basophils Relative: 1 %
EOS PCT: 1 %
Eosinophils Absolute: 0.1 10*3/uL (ref 0–0.7)
HCT: 38.8 % (ref 35.0–47.0)
Hemoglobin: 12.9 g/dL (ref 12.0–16.0)
Lymphocytes Relative: 44 %
Lymphs Abs: 2.9 10*3/uL (ref 1.0–3.6)
MCH: 31.7 pg (ref 26.0–34.0)
MCHC: 33.4 g/dL (ref 32.0–36.0)
MCV: 94.8 fL (ref 80.0–100.0)
MONOS PCT: 9 %
Monocytes Absolute: 0.6 10*3/uL (ref 0.2–0.9)
Neutro Abs: 2.9 10*3/uL (ref 1.4–6.5)
Neutrophils Relative %: 45 %
PLATELETS: 151 10*3/uL (ref 150–440)
RBC: 4.09 MIL/uL (ref 3.80–5.20)
RDW: 12.8 % (ref 11.5–14.5)
WBC: 6.4 10*3/uL (ref 3.6–11.0)

## 2014-09-12 LAB — URINE DRUG SCREEN, QUALITATIVE (ARMC ONLY)
Amphetamines, Ur Screen: NOT DETECTED
BARBITURATES, UR SCREEN: NOT DETECTED
Benzodiazepine, Ur Scrn: NOT DETECTED
COCAINE METABOLITE, UR ~~LOC~~: NOT DETECTED
Cannabinoid 50 Ng, Ur ~~LOC~~: NOT DETECTED
MDMA (ECSTASY) UR SCREEN: NOT DETECTED
Methadone Scn, Ur: NOT DETECTED
OPIATE, UR SCREEN: NOT DETECTED
PHENCYCLIDINE (PCP) UR S: NOT DETECTED
Tricyclic, Ur Screen: NOT DETECTED

## 2014-09-12 LAB — PHENYTOIN LEVEL, TOTAL: Phenytoin Lvl: 5.5 ug/mL — ABNORMAL LOW (ref 10.0–20.0)

## 2014-09-12 LAB — ETHANOL: Alcohol, Ethyl (B): 227 mg/dL — ABNORMAL HIGH (ref <5)

## 2014-09-12 LAB — VALPROIC ACID LEVEL: Valproic Acid Lvl: 31 ug/mL — ABNORMAL LOW (ref 50.0–100.0)

## 2014-09-12 MED ORDER — PHENYTOIN SODIUM EXTENDED 100 MG PO CAPS
100.0000 mg | ORAL_CAPSULE | Freq: Once | ORAL | Status: AC
  Administered 2014-09-12: 100 mg via ORAL
  Filled 2014-09-12: qty 1

## 2014-09-12 MED ORDER — BUTALBITAL-APAP-CAFFEINE 50-325-40 MG PO TABS
1.0000 | ORAL_TABLET | Freq: Once | ORAL | Status: AC
  Administered 2014-09-13: 1 via ORAL
  Filled 2014-09-12: qty 1

## 2014-09-12 MED ORDER — DIVALPROEX SODIUM 500 MG PO DR TAB
500.0000 mg | DELAYED_RELEASE_TABLET | Freq: Once | ORAL | Status: AC
  Administered 2014-09-12: 500 mg via ORAL
  Filled 2014-09-12: qty 1

## 2014-09-12 MED ORDER — GI COCKTAIL ~~LOC~~
30.0000 mL | Freq: Once | ORAL | Status: AC
  Administered 2014-09-13: 30 mL via ORAL
  Filled 2014-09-12: qty 30

## 2014-09-12 NOTE — ED Notes (Addendum)
Pt presents to ED via EMS for witnessed seizure that lasted for about 5 minutes. Pt states she was sitting on a chair and woke up lying on floor. Pt states she felt that her eyes were gazing prior to passing out. Hx of seizure, missed one dose for each dilantin and divalproex because she ran out. Pt alerts and oriented x4 at arrival to ED. Admits for having one beer 16 oz tonight.

## 2014-09-13 MED ORDER — DIVALPROEX SODIUM 500 MG PO DR TAB: 500.0000 mg | DELAYED_RELEASE_TABLET | Freq: Two times a day (BID) | ORAL | Status: DC

## 2014-09-13 MED ORDER — SODIUM CHLORIDE 0.9 % IV SOLN
1000.0000 mg | Freq: Once | INTRAVENOUS | Status: AC
  Administered 2014-09-13: 1000 mg via INTRAVENOUS
  Filled 2014-09-13: qty 10

## 2014-09-13 NOTE — Discharge Instructions (Signed)

## 2014-09-13 NOTE — ED Provider Notes (Signed)
[facility] Emergency Department Provider Note  ____________________________________________  Time seen: Approximately 2326 AM  I have reviewed the triage vital signs and the nursing notes.   HISTORY  Chief Complaint Seizures    HPI [name] is a 49 y.o. female with a history of seizures who had a seizure tonight. The patient reports that it happened at around 8 PM. The patient reports that she ran out of her Depakote yesterday. She reports that she's pulses take Dilantin and Depakote and she has not taken her Depakote today. She reports that she contacted [facility] to get her medication but they report that they were unable to give it to her today. The patient also reports that since her seizure she's had a headache and she feels sore in the back of her head. The patient reports that she fell off the couch and is unsure how long the seizure lasted. The patient reports that she was sitting on the couch and woke up on the floor. The patient's last seizure was 2 weeks ago and she was admitted to the hospital. The patient reports that she did have some nausea and gagging today. She has been taking her Dilantin.   Past Medical History  Diagnosis Date  . Allergy   . Seizures     Patient Active Problem List   Diagnosis Date Noted  . Cocaine abuse 08/22/2014  . Seizure disorder 08/21/2014    History reviewed. No pertinent past surgical history.  Current Outpatient Rx  Name  Route  Sig  Dispense  Refill  . divalproex (DEPAKOTE) 500 MG DR tablet   Oral   Take 1 tablet (500 mg total) by mouth every 12 (twelve) hours.   60 tablet   0   . phenytoin (DILANTIN) 100 MG ER capsule   Oral   Take 3 capsules (300 mg total) by mouth daily.   30 capsule   0   . divalproex (DEPAKOTE) 500 MG DR tablet   Oral   Take 1 tablet (500 mg total) by mouth 2 (two) times daily.   30 tablet   0   . ibuprofen (ADVIL,MOTRIN) 800 MG tablet   Oral  Take 1 tablet (800 mg total) by mouth every 8 (eight) hours as needed.   30 tablet   0   . oxyCODONE-acetaminophen (ROXICET) 5-325 MG per tablet   Oral   Take 1-2 tablets by mouth every 4 (four) hours as needed for severe pain.   15 tablet   0     Allergies Flagyl  History reviewed. No pertinent family history.  Social History History  Substance Use Topics  . Smoking status: Current Every Day Smoker -- 0.20 packs/day    Types: Cigarettes  . Smokeless tobacco: Not on file  . Alcohol Use: Yes    Review of Systems Constitutional: No fever/chills Eyes: No visual changes. ENT: No sore throat. Cardiovascular: Denies chest pain. Respiratory: Denies shortness of breath. Gastrointestinal: Nausea,  abdominal pain.  No diarrhea.  No constipation. Genitourinary: Negative for dysuria. Musculoskeletal: Negative for back pain. Skin: Negative for rash. Neurological: Headache and seizure  10-point ROS otherwise negative.  ____________________________________________   PHYSICAL EXAM:  VITAL SIGNS: ED Triage Vitals  Enc Vitals Group     BP 09/12/14 2248 100/27 mmHg     Pulse Rate 09/12/14 2248 86     Resp 09/12/14 2248 16     Temp 09/12/14 2248 97.7 F (36.5 C)  Temp Source 09/12/14 2248 Oral     SpO2 09/12/14 2248 94 %     Weight 09/12/14 2248 106 lb (48.081 kg)     Height 09/12/14 2248 5' (1.524 m)     Head Cir --      Peak Flow --      Pain Score 09/12/14 2306 0     Pain Loc --      Pain Edu? --      Excl. in GC? --     Constitutional: Alert and oriented. Well appearing and in no acute distress. Eyes: Conjunctivae are normal. PERRL. EOMI. Head: Atraumatic. Nose: No congestion/rhinnorhea. Mouth/Throat: Mucous membranes are moist.  Oropharynx non-erythematous. Cardiovascular: Normal rate, regular rhythm. Grossly normal heart sounds.  Good peripheral circulation. Respiratory: Normal respiratory effort.  No retractions. Lungs CTAB. Gastrointestinal: Soft with  left upper quadrant tenderness to palpation No distention. Positive bowel sounds Musculoskeletal: No lower extremity tenderness nor edema.  Neurologic:  Normal speech and language. No gross focal neurologic deficits are appreciated.  Skin:  Skin is warm, dry and intact. No rash noted. Psychiatric: Mood and affect are normal.   ____________________________________________   LABS (all labs ordered are listed, but only abnormal results are displayed)  Labs Reviewed  VALPROIC ACID LEVEL - Abnormal; Notable for the following:    Valproic Acid Lvl 31 (*)    All other components within normal limits  PHENYTOIN LEVEL, TOTAL - Abnormal; Notable for the following:    Phenytoin Lvl 5.5 (*)    All other components within normal limits  ETHANOL - Abnormal; Notable for the following:    Alcohol, Ethyl (B) 227 (*)    All other components within normal limits  CBC WITH DIFFERENTIAL/PLATELET  BASIC METABOLIC PANEL  URINE DRUG SCREEN, QUALITATIVE ([facility] ONLY)   ____________________________________________  EKG  None ____________________________________________  RADIOLOGY  CT head: No acute intracranial pathology seen on CT, Mild focal subcortical white matter change at the high left frontoparietal region, may reflect chronic white matter infarct ____________________________________________   PROCEDURES  Procedure(s) performed: None  Critical Care performed: No  ____________________________________________   INITIAL IMPRESSION / ASSESSMENT AND PLAN / ED COURSE  Pertinent labs & imaging results that were available during my care of the patient were reviewed by me and considered in my medical decision making (see chart for details).  This is a 49 year old female with a history of seizures who comes in today with a seizure. The patient does have an alcohol abusing cocaine history as well and does have an elevated alcohol today. The patient received a dose of her Depakote as well as  Dilantin. And the patient was here 2 weeks ago she was also on Keppra so I will give her a gram of Keppra IV. The patient will be reassessed once the blood work has resulted and the patient will be dispositioned at that time.  The recent has not had any seizure activity while in the emergency department. She'll be discharged home to follow-up with Freida Busman not to receive her medications. ____________________________________________   FINAL CLINICAL IMPRESSION(S) / ED DIAGNOSES  Final diagnoses:  Seizure      Rebecka Apley, MD 09/13/14 [phone]

## 2014-11-01 ENCOUNTER — Emergency Department
Admission: EM | Admit: 2014-11-01 | Discharge: 2014-11-02 | Disposition: A | Discharge status: Home / Self Care | Payer: Self-pay | Attending: Emergency Medicine | Admitting: Emergency Medicine

## 2014-11-01 ENCOUNTER — Encounter: Payer: Self-pay | Admitting: Emergency Medicine

## 2014-11-01 DIAGNOSIS — Z720 Tobacco use: Secondary | ICD-10-CM | POA: Insufficient documentation

## 2014-11-01 DIAGNOSIS — F10920 Alcohol use, unspecified with intoxication, uncomplicated: Secondary | ICD-10-CM

## 2014-11-01 DIAGNOSIS — R7889 Finding of other specified substances, not normally found in blood: Secondary | ICD-10-CM

## 2014-11-01 DIAGNOSIS — G40909 Epilepsy, unspecified, not intractable, without status epilepticus: Secondary | ICD-10-CM | POA: Insufficient documentation

## 2014-11-01 DIAGNOSIS — F10120 Alcohol abuse with intoxication, uncomplicated: Secondary | ICD-10-CM | POA: Insufficient documentation

## 2014-11-01 DIAGNOSIS — F329 Major depressive disorder, single episode, unspecified: Secondary | ICD-10-CM | POA: Insufficient documentation

## 2014-11-01 DIAGNOSIS — R569 Unspecified convulsions: Secondary | ICD-10-CM

## 2014-11-01 DIAGNOSIS — Z79899 Other long term (current) drug therapy: Secondary | ICD-10-CM

## 2014-11-01 HISTORY — DX: Unspecified convulsions: R56.9

## 2014-11-01 LAB — COMPREHENSIVE METABOLIC PANEL
ALK PHOS: 77 U/L (ref 38–126)
ALT: 21 U/L (ref 14–54)
ANION GAP: 11 (ref 5–15)
AST: 40 U/L (ref 15–41)
Albumin: 4 g/dL (ref 3.5–5.0)
BUN: 8 mg/dL (ref 6–20)
CALCIUM: 8.9 mg/dL (ref 8.9–10.3)
CO2: 22 mmol/L (ref 22–32)
Chloride: 100 mmol/L — ABNORMAL LOW (ref 101–111)
Creatinine, Ser: 0.49 mg/dL (ref 0.44–1.00)
Glucose, Bld: 81 mg/dL (ref 65–99)
Potassium: 3.6 mmol/L (ref 3.5–5.1)
Sodium: 133 mmol/L — ABNORMAL LOW (ref 135–145)
Total Bilirubin: 0.4 mg/dL (ref 0.3–1.2)
Total Protein: 7.7 g/dL (ref 6.5–8.1)

## 2014-11-01 LAB — CBC WITH DIFFERENTIAL/PLATELET
Basophils Absolute: 0 10*3/uL (ref 0–0.1)
Basophils Relative: 1 %
Eosinophils Absolute: 0.1 10*3/uL (ref 0–0.7)
Eosinophils Relative: 1 %
HCT: 38.1 % (ref 35.0–47.0)
HEMOGLOBIN: 12.8 g/dL (ref 12.0–16.0)
LYMPHS ABS: 3 10*3/uL (ref 1.0–3.6)
LYMPHS PCT: 41 %
MCH: 32.6 pg (ref 26.0–34.0)
MCHC: 33.7 g/dL (ref 32.0–36.0)
MCV: 96.7 fL (ref 80.0–100.0)
MONOS PCT: 10 %
Monocytes Absolute: 0.7 10*3/uL (ref 0.2–0.9)
NEUTROS PCT: 47 %
Neutro Abs: 3.5 10*3/uL (ref 1.4–6.5)
Platelets: 170 10*3/uL (ref 150–440)
RBC: 3.94 MIL/uL (ref 3.80–5.20)
RDW: 14.3 % (ref 11.5–14.5)
WBC: 7.4 10*3/uL (ref 3.6–11.0)

## 2014-11-01 LAB — URINALYSIS COMPLETE WITH MICROSCOPIC (ARMC ONLY)
BACTERIA UA: NONE SEEN
BILIRUBIN URINE: NEGATIVE
Glucose, UA: NEGATIVE mg/dL
HGB URINE DIPSTICK: NEGATIVE
Ketones, ur: NEGATIVE mg/dL
Leukocytes, UA: NEGATIVE
NITRITE: NEGATIVE
Protein, ur: NEGATIVE mg/dL
RBC / HPF: NONE SEEN RBC/hpf (ref 0–5)
SPECIFIC GRAVITY, URINE: 1.002 — AB (ref 1.005–1.030)
WBC, UA: NONE SEEN WBC/hpf (ref 0–5)
pH: 6 (ref 5.0–8.0)

## 2014-11-01 LAB — URINE DRUG SCREEN, QUALITATIVE (ARMC ONLY)
Amphetamines, Ur Screen: NOT DETECTED
Barbiturates, Ur Screen: NOT DETECTED
Benzodiazepine, Ur Scrn: NOT DETECTED
COCAINE METABOLITE, UR ~~LOC~~: NOT DETECTED
Cannabinoid 50 Ng, Ur ~~LOC~~: NOT DETECTED
MDMA (Ecstasy)Ur Screen: NOT DETECTED
METHADONE SCREEN, URINE: NOT DETECTED
OPIATE, UR SCREEN: NOT DETECTED
PHENCYCLIDINE (PCP) UR S: NOT DETECTED
Tricyclic, Ur Screen: NOT DETECTED

## 2014-11-01 MED ORDER — SODIUM CHLORIDE 0.9 % IV BOLUS (SEPSIS)
1000.0000 mL | Freq: Once | INTRAVENOUS | Status: AC
  Administered 2014-11-02: 1000 mL via INTRAVENOUS

## 2014-11-01 MED ORDER — LORAZEPAM 2 MG/ML IJ SOLN
1.0000 mg | Freq: Once | INTRAMUSCULAR | Status: AC
  Administered 2014-11-02: 1 mg via INTRAVENOUS
  Filled 2014-11-01: qty 1

## 2014-11-01 NOTE — ED Notes (Signed)
Pt arrives via EMS from home s/p seizures.  Pt with 1 unwitnessed seizure.  Pt states that she fell and hit her left back on the thresh hold of the doorway.  Pt called EMS.  1 full body seizure witnessed by EMS lasting approximately 2 mins.  No incontinence.  Pt arrives a/o x4.  Pt with hx of seizures.  Pt takes Depakote and Dilantin which she states that she is compliant.

## 2014-11-01 NOTE — ED Provider Notes (Signed)
[facility] Emergency Department Provider Note  ____________________________________________  Time seen: Approximately 11:47 PM  I have reviewed the triage vital signs and the nursing notes.   HISTORY  Chief Complaint Seizures    HPI [name] is a 49 y.o. female who presents to the ED via EMS from home s/p seizure. Patient has a history of seizure disorder; states she is compliant taking her Dilantin and Depakote. States she had a grand mal seizure, fell and struck her left lower back in the doorway. EMS reports witnessing tonic-clonic seizure lasting approximately 2 minutes. Arrives to the ED alert, oriented in no acute distress. Denies recent fever, chills, chest pain, shortness of breath, abdominal pain, vomiting, diarrhea, headache, neck pain. Patient has a history of depression; denies active SI/HI/AH/VH. Nothing makes the pain better. Movement makes the low back pain worse.   Past Medical History  Diagnosis Date  . Allergy   . Seizures   . Seizure     Patient Active Problem List   Diagnosis Date Noted  . Cocaine abuse 08/22/2014  . Seizure disorder 08/21/2014    No past surgical history on file.  Current Outpatient Rx  Name  Route  Sig  Dispense  Refill  . divalproex (DEPAKOTE) 500 MG DR tablet   Oral   Take 1 tablet (500 mg total) by mouth every 12 (twelve) hours.   60 tablet   0   . divalproex (DEPAKOTE) 500 MG DR tablet   Oral   Take 1 tablet (500 mg total) by mouth 2 (two) times daily.   30 tablet   0   . ibuprofen (ADVIL,MOTRIN) 800 MG tablet   Oral   Take 1 tablet (800 mg total) by mouth every 8 (eight) hours as needed.   30 tablet   0   . oxyCODONE-acetaminophen (ROXICET) 5-325 MG per tablet   Oral   Take 1-2 tablets by mouth every 4 (four) hours as needed for severe pain.   15 tablet   0   . phenytoin (DILANTIN) 100 MG ER capsule   Oral   Take 3 capsules (300 mg total) by mouth daily.   30 capsule   0      Allergies Flagyl  No family history on file.  Social History Social History  Substance Use Topics  . Smoking status: Current Every Day Smoker -- 0.20 packs/day    Types: Cigarettes  . Smokeless tobacco: Not on file  . Alcohol Use: Yes    Review of Systems Constitutional: No fever/chills Eyes: No visual changes. ENT: No sore throat. Cardiovascular: Denies chest pain. Respiratory: Denies shortness of breath. Gastrointestinal: No abdominal pain.  No nausea, no vomiting.  No diarrhea.  No constipation. Genitourinary: Negative for dysuria. Musculoskeletal: Negative for back pain. Skin: Negative for rash. Neurological: Positive for seizure. Negative for headaches, focal weakness or numbness. Psychiatric:Positive for depression without SI.  10-point ROS otherwise negative.  ____________________________________________   PHYSICAL EXAM:  VITAL SIGNS: ED Triage Vitals  Enc Vitals Group     BP 11/01/14 2309 131/91 mmHg     Pulse Rate 11/01/14 2309 84     Resp 11/01/14 2309 17     Temp 11/01/14 2309 97.9 F (36.6 C)     Temp Source 11/01/14 2309 Oral     SpO2 11/01/14 2309 99 %     Weight --      Height --      Head Cir --      Peak Flow --  Pain Score --      Pain Loc --      Pain Edu? --      Excl. in GC? --     Constitutional: Alert and oriented. Well appearing and in no acute distress. Eyes: Conjunctivae are normal. PERRL. EOMI. Head: Atraumatic. Nose: No congestion/rhinnorhea. Mouth/Throat: Mucous membranes are moist.  Oropharynx non-erythematous. Neck: No stridor. No cervical spine tenderness to palpation. Cardiovascular: Normal rate, regular rhythm. Grossly normal heart sounds.  Good peripheral circulation. Respiratory: Normal respiratory effort.  No retractions. Lungs CTAB. Gastrointestinal: Soft and nontender. No distention. No abdominal bruits. No CVA tenderness. Musculoskeletal: No lower extremity tenderness nor edema.  No joint  effusions. Neurologic:  Normal speech and language. No gross focal neurologic deficits are appreciated.  Skin:  Skin is warm, dry and intact. No rash noted. Psychiatric: Mood and affect are normal. Speech and behavior are normal.  ____________________________________________   LABS (all labs ordered are listed, but only abnormal results are displayed)  Labs Reviewed  VALPROIC ACID LEVEL - Abnormal; Notable for the following:    Valproic Acid Lvl 24 (*)    All other components within normal limits  PHENYTOIN LEVEL, TOTAL - Abnormal; Notable for the following:    Phenytoin Lvl <2.5 (*)    All other components within normal limits  COMPREHENSIVE METABOLIC PANEL - Abnormal; Notable for the following:    Sodium 133 (*)    Chloride 100 (*)    All other components within normal limits  ETHANOL - Abnormal; Notable for the following:    Alcohol, Ethyl (B) 209 (*)    All other components within normal limits  URINALYSIS COMPLETEWITH MICROSCOPIC ([facility] ONLY) - Abnormal; Notable for the following:    Color, Urine COLORLESS (*)    APPearance CLEAR (*)    Specific Gravity, Urine 1.002 (*)    Squamous Epithelial / LPF 0-5 (*)    All other components within normal limits  CBC WITH DIFFERENTIAL/PLATELET  URINE DRUG SCREEN, QUALITATIVE ([facility] ONLY)   ____________________________________________  EKG  ED ECG REPORT I, SUNG,JADE J, the attending physician, personally viewed and interpreted this ECG.   Date: 11/02/2014  EKG Time: 2302  Rate: 82  Rhythm: normal EKG, normal sinus rhythm  Axis: Normal  Intervals:none  ST&T Change: Nonspecific  ____________________________________________  RADIOLOGY  Lumbar spine x-rays (viewed by me, interpreted per Dr. Manus Gunning): Scoliosis and associated multilevel degenerative change. No acute fracture or subluxation.  Chest 1 view (viewed by me, interpreted per Dr. Grace Isaac): No active  disease. ____________________________________________   PROCEDURES  Procedure(s) performed: None  Critical Care performed: No  ____________________________________________   INITIAL IMPRESSION / ASSESSMENT AND PLAN / ED COURSE  Pertinent labs & imaging results that were available during my care of the patient were reviewed by me and considered in my medical decision making (see chart for details).  49 year old female with seizure disorder who presents s/p tonic-clonic seizure complaining of lower back pain. Denies striking head or neck pain. Wil obtain basic lab work including Dilantin and Depakote levels, obtain imaging studies of lower back, IV Ativan and reassess.  ----------------------------------------- 2:06 AM on 11/02/2014 -----------------------------------------  Depakote and Dilantin were loaded orally. Patient currently resting in no acute distress. Updated patient of laboratory and imaging results. Strict return precautions given. Patient verbalizes understanding and agrees with plan of care. ____________________________________________   FINAL CLINICAL IMPRESSION(S) / ED DIAGNOSES  Final diagnoses:  Seizure  Alcohol intoxication, uncomplicated  Subtherapeutic serum dilantin level  Seizure secondary to subtherapeutic anticonvulsant  medication      Irean Hong, MD 11/02/14 [phone]

## 2014-11-02 ENCOUNTER — Other Ambulatory Visit: Payer: Self-pay

## 2014-11-02 ENCOUNTER — Emergency Department: Payer: Self-pay

## 2014-11-02 LAB — ETHANOL: Alcohol, Ethyl (B): 209 mg/dL — ABNORMAL HIGH (ref <5)

## 2014-11-02 LAB — VALPROIC ACID LEVEL: Valproic Acid Lvl: 24 ug/mL — ABNORMAL LOW (ref 50.0–100.0)

## 2014-11-02 LAB — PHENYTOIN LEVEL, TOTAL: Phenytoin Lvl: <2.5 ug/mL — ABNORMAL LOW (ref 10.0–20.0)

## 2014-11-02 MED ORDER — PHENYTOIN SODIUM EXTENDED 100 MG PO CAPS
300.0000 mg | ORAL_CAPSULE | Freq: Once | ORAL | Status: AC
  Administered 2014-11-02: 300 mg via ORAL
  Filled 2014-11-02: qty 3

## 2014-11-02 MED ORDER — DIVALPROEX SODIUM 500 MG PO DR TAB
500.0000 mg | DELAYED_RELEASE_TABLET | Freq: Once | ORAL | Status: AC
Start: 2014-11-02 — End: 2014-11-02
  Administered 2014-11-02: 500 mg via ORAL
  Filled 2014-11-02: qty 1

## 2014-11-02 NOTE — Discharge Instructions (Signed)
1. Your Dilantin and Depakote levels were low. You must take these daily as directed by your doctor. 2. Avoid alcohol as this will lower your seizure threshold. 3. Do not drive until cleared by your doctor. 4. Return to the ER for recurrent or worsening symptoms, persistent vomiting, difficulty breathing or other concerns.  Seizure, Adult A seizure is abnormal electrical activity in the brain. Seizures usually last from 30 seconds to 2 minutes. There are various types of seizures. Before a seizure, you may have a warning sensation (aura) that a seizure is about to occur. An aura may include the following symptoms:   Fear or anxiety.  Nausea.  Feeling like the room is spinning (vertigo).  Vision changes, such as seeing flashing lights or spots. Common symptoms during a seizure include:  A change in attention or behavior (altered mental status).  Convulsions with rhythmic jerking movements.  Drooling.  Rapid eye movements.  Grunting.  Loss of bladder and bowel control.  Bitter taste in the mouth.  Tongue biting. After a seizure, you may feel confused and sleepy. You may also have an injury resulting from convulsions during the seizure. HOME CARE INSTRUCTIONS   If you are given medicines, take them exactly as prescribed by your health care provider.  Keep all follow-up appointments as directed by your health care provider.  Do not swim or drive or engage in risky activity during which a seizure could cause further injury to you or others until your health care provider says it is OK.  Get adequate rest.  Teach friends and family what to do if you have a seizure. They should:  Lay you on the ground to prevent a fall.  Put a cushion under your head.  Loosen any tight clothing around your neck.  Turn you on your side. If vomiting occurs, this helps keep your airway clear.  Stay with you until you recover.  Know whether or not you need emergency care. SEEK IMMEDIATE  MEDICAL CARE IF:  The seizure lasts longer than 5 minutes.  The seizure is severe or you do not wake up immediately after the seizure.  You have an altered mental status after the seizure.  You are having more frequent or worsening seizures. Someone should drive you to the emergency department or call local emergency services (911 in [place] MAKE SURE YOU:  Understand these instructions.  Will watch your condition.  Will get help right away if you are not doing well or get worse. Document Released: 01/19/2000 Document Revised: 11/11/2012 Document Reviewed: 09/02/2012 [facility] Patient Information 2015 [place], [place]. This information is not intended to replace advice given to you by your health care provider. Make sure you discuss any questions you have with your health care provider.  Alcohol Intoxication Alcohol intoxication occurs when the amount of alcohol that a person has consumed impairs his or her ability to mentally and physically function. Alcohol directly impairs the normal chemical activity of the brain. Drinking large amounts of alcohol can lead to changes in mental function and behavior, and it can cause many physical effects that can be harmful.  Alcohol intoxication can range in severity from mild to very severe. Various factors can affect the level of intoxication that occurs, such as the person's age, gender, weight, frequency of alcohol consumption, and the presence of other medical conditions (such as diabetes, seizures, or heart conditions). Dangerous levels of alcohol intoxication may occur when people drink large amounts of alcohol in a short period (binge drinking). Alcohol  can also be especially dangerous when combined with certain prescription medicines or "recreational" drugs. SIGNS AND SYMPTOMS Some common signs and symptoms of mild alcohol intoxication include:  Loss of coordination.  Changes in mood and behavior.  Impaired judgment.  Slurred speech. As  alcohol intoxication progresses to more severe levels, other signs and symptoms will appear. These may include:  Vomiting.  Confusion and impaired memory.  Slowed breathing.  Seizures.  Loss of consciousness. DIAGNOSIS  Your health care provider will take a medical history and perform a physical exam. You will be asked about the amount and type of alcohol you have consumed. Blood tests will be done to measure the concentration of alcohol in your blood. In many places, your blood alcohol level must be lower than 80 mg/dL (9.60%) to legally drive. However, many dangerous effects of alcohol can occur at much lower levels.  TREATMENT  People with alcohol intoxication often do not require treatment. Most of the effects of alcohol intoxication are temporary, and they go away as the alcohol naturally leaves the body. Your health care provider will monitor your condition until you are stable enough to go home. Fluids are sometimes given through an IV access tube to help prevent dehydration.  HOME CARE INSTRUCTIONS  Do not drive after drinking alcohol.  Stay hydrated. Drink enough water and fluids to keep your urine clear or pale yellow. Avoid caffeine.   Only take over-the-counter or prescription medicines as directed by your health care provider.  SEEK MEDICAL CARE IF:   You have persistent vomiting.   You do not feel better after a few days.  You have frequent alcohol intoxication. Your health care provider can help determine if you should see a substance use treatment counselor. SEEK IMMEDIATE MEDICAL CARE IF:   You become shaky or tremble when you try to stop drinking.   You shake uncontrollably (seizure).   You throw up (vomit) blood. This may be bright red or may look like black coffee grounds.   You have blood in your stool. This may be bright red or may appear as a black, tarry, bad smelling stool.   You become lightheaded or faint.  MAKE SURE YOU:   Understand these  instructions.  Will watch your condition.  Will get help right away if you are not doing well or get worse. Document Released: 10/31/2004 Document Revised: 09/23/2012 Document Reviewed: 06/26/2012 [facility] Patient Information 2015 [place], [place]. This information is not intended to replace advice given to you by your health care provider. Make sure you discuss any questions you have with your health care provider.

## 2014-11-17 ENCOUNTER — Encounter: Payer: Self-pay | Admitting: Pharmacist

## 2014-12-13 ENCOUNTER — Emergency Department
Admission: EM | Admit: 2014-12-13 | Discharge: 2014-12-13 | Disposition: A | Discharge status: Home / Self Care | Payer: Self-pay | Attending: Emergency Medicine | Admitting: Emergency Medicine

## 2014-12-13 ENCOUNTER — Emergency Department: Payer: Self-pay

## 2014-12-13 DIAGNOSIS — Z79899 Other long term (current) drug therapy: Secondary | ICD-10-CM | POA: Insufficient documentation

## 2014-12-13 DIAGNOSIS — Z720 Tobacco use: Secondary | ICD-10-CM | POA: Insufficient documentation

## 2014-12-13 DIAGNOSIS — J40 Bronchitis, not specified as acute or chronic: Secondary | ICD-10-CM | POA: Insufficient documentation

## 2014-12-13 MED ORDER — HYDROCOD POLST-CPM POLST ER 10-8 MG/5ML PO SUER
5.0000 mL | Freq: Once | ORAL | Status: AC
  Administered 2014-12-13: 5 mL via ORAL
  Filled 2014-12-13: qty 5

## 2014-12-13 MED ORDER — AZITHROMYCIN 250 MG PO TABS: 250.0000 mg | ORAL_TABLET | Freq: Every day | ORAL | Status: DC

## 2014-12-13 MED ORDER — AZITHROMYCIN 250 MG PO TABS
500.0000 mg | ORAL_TABLET | Freq: Once | ORAL | Status: AC
  Administered 2014-12-13: 500 mg via ORAL
  Filled 2014-12-13: qty 2

## 2014-12-13 MED ORDER — HYDROCOD POLST-CPM POLST ER 10-8 MG/5ML PO SUER: 5.0000 mL | Freq: Two times a day (BID) | ORAL | Status: DC

## 2014-12-13 NOTE — Discharge Instructions (Signed)
1. Take antibiotic as prescribed (azithromycin 250 mg daily 4 days). 2. Take cough medicine as needed (Tussionex). 3. Return to the ER for worsening symptoms, fever, persistent vomiting, difficulty breathing or other concerns.

## 2014-12-13 NOTE — ED Notes (Signed)
Pt in NAD.  States she feels better.  No SOB noted.  Discharge teaching complete, pt voiced understanding.  No questions or concerns at this time.  Pt states a friend will come pick her up and drive her home.

## 2014-12-13 NOTE — ED Notes (Signed)
Patient ambulatory to triage with steady gait, without difficulty or distress noted; pt reports productive cough clear sputum x week; denies any other accomp symptoms

## 2014-12-13 NOTE — ED Notes (Signed)
Pt states that she has been sick for approx 3 days.  Pt states that she has been throwing up mucus and that she has a hard time eating because of the mucus gagging her and causing her to vomit.  Pt states mucus is white and thick when it comes up and that there is a large amount.  Pt states that she had a slight fever yesterday, but couldn't recall how much.  Pt states she has generalized aching all over.  Pt states she cannot lay flat to sleep because she has a hard time breathing.  States she smokes occasionally, but not regularly.  No runny nose reported at this time.  Pt states she has tried to take theraflu, but threw it up.

## 2014-12-13 NOTE — ED Provider Notes (Signed)
[facility] Emergency Department Provider Note  ____________________________________________  Time seen: Approximately 5:33 AM  I have reviewed the triage vital signs and the nursing notes.   HISTORY  Chief Complaint Cough    HPI [name] is a 49 y.o. female who presents to the ED from home with a chief complaint of cough. Patient notes cough productive of clear sputum 3 days. Denies associated symptoms of fever, chills, chest pain, shortness of breath, abdominal pain, nausea, vomiting, diarrhea. Denies recent travel or trauma. Patient is concerned that she might have pneumonia. Nothing makes her cough better or worse, despite taking OTC Robitussin.   Past Medical History  Diagnosis Date  . Allergy   . Seizures   . Seizure     Patient Active Problem List   Diagnosis Date Noted  . Cocaine abuse 08/22/2014  . Seizure disorder (HCC) 08/21/2014    No past surgical history on file.  Current Outpatient Rx  Name  Route  Sig  Dispense  Refill  . divalproex (DEPAKOTE) 500 MG DR tablet   Oral   Take 1 tablet (500 mg total) by mouth every 12 (twelve) hours. Patient taking differently: Take 500 mg by mouth 3 (three) times daily.    60 tablet   0   . haloperidol (HALDOL) 2 MG tablet   Oral   Take 1 mg by mouth at bedtime.         . phenytoin (DILANTIN) 100 MG ER capsule   Oral   Take 3 capsules (300 mg total) by mouth daily. Patient taking differently: Take 100 mg by mouth 3 (three) times daily.    30 capsule   0   . traZODone (DESYREL) 100 MG tablet   Oral   Take 100 mg by mouth at bedtime.         . divalproex (DEPAKOTE) 500 MG DR tablet   Oral   Take 1 tablet (500 mg total) by mouth 2 (two) times daily. Patient not taking: Reported on 12/13/2014   30 tablet   0   . ibuprofen (ADVIL,MOTRIN) 800 MG tablet   Oral   Take 1 tablet (800 mg total) by mouth every 8 (eight) hours as needed. Patient not taking: Reported on  12/13/2014   30 tablet   0   . oxyCODONE-acetaminophen (ROXICET) 5-325 MG per tablet   Oral   Take 1-2 tablets by mouth every 4 (four) hours as needed for severe pain. Patient not taking: Reported on 12/13/2014   15 tablet   0     Allergies Flagyl  No family history on file.  Social History Social History  Substance Use Topics  . Smoking status: Current Every Day Smoker -- 0.20 packs/day    Types: Cigarettes  . Smokeless tobacco: Not on file  . Alcohol Use: Yes    Review of Systems Constitutional: No fever/chills Eyes: No visual changes. ENT: No sore throat. Cardiovascular: Denies chest pain. Respiratory: Positive for productive cough. Denies shortness of breath. Gastrointestinal: No abdominal pain.  No nausea, no vomiting.  No diarrhea.  No constipation. Genitourinary: Negative for dysuria. Musculoskeletal: Negative for back pain. Skin: Negative for rash. Neurological: Negative for headaches, focal weakness or numbness.  10-point ROS otherwise negative.  ____________________________________________   PHYSICAL EXAM:  VITAL SIGNS: ED Triage Vitals  Enc Vitals Group     BP 12/13/14 0224 100/71 mmHg     Pulse Rate 12/13/14 0224 98     Resp 12/13/14 0224 20  Temp 12/13/14 0224 97.6 F (36.4 C)     Temp Source 12/13/14 0224 Oral     SpO2 12/13/14 0224 97 %     Weight 12/13/14 0224 110 lb (49.896 kg)     Height 12/13/14 0224 5' (1.524 m)     Head Cir --      Peak Flow --      Pain Score 12/13/14 0527 7     Pain Loc --      Pain Edu? --      Excl. in GC? --     Constitutional: Alert and oriented. Well appearing and in no acute distress. Eyes: Conjunctivae are normal. PERRL. EOMI. Head: Atraumatic. Nose: No congestion/rhinnorhea. Mouth/Throat: Mucous membranes are moist.  Oropharynx non-erythematous. Neck: No stridor.   Cardiovascular: Normal rate, regular rhythm. Grossly normal heart sounds.  Good peripheral circulation. Respiratory: Normal  respiratory effort.  No retractions. Lungs CTAB. Loose, rattling cough noted. Gastrointestinal: Soft and nontender. No distention. No abdominal bruits. No CVA tenderness. Musculoskeletal: No lower extremity tenderness nor edema.  No joint effusions. Neurologic:  Normal speech and language. No gross focal neurologic deficits are appreciated. No gait instability. Skin:  Skin is warm, dry and intact. No rash noted. Psychiatric: Mood and affect are normal. Speech and behavior are normal.  ____________________________________________   LABS (all labs ordered are listed, but only abnormal results are displayed)  Labs Reviewed - No data to display ____________________________________________  EKG  None ____________________________________________  RADIOLOGY  Chest x-ray (viewed by me, interpreted per Dr. Manus GunningEhinger): No acute pulmonary process. ____________________________________________   PROCEDURES  Procedure(s) performed: None  Critical Care performed: No  ____________________________________________   INITIAL IMPRESSION / ASSESSMENT AND PLAN / ED COURSE  Pertinent labs & imaging results that were available during my care of the patient were reviewed by me and considered in my medical decision making (see chart for details).  49 year old female with cough/congestion suggestive of acute bronchitis. Given patient's history of seizure, will place on empiric antibiotics to prevent seizures secondary to infection. Strict return precautions given. Patient verbalizes understanding and agrees with plan of care. ____________________________________________   FINAL CLINICAL IMPRESSION(S) / ED DIAGNOSES  Final diagnoses:  Bronchitis      Irean HongJade J Linc Renne, MD 12/13/14 [id number]

## 2015-01-26 ENCOUNTER — Ambulatory Visit: Payer: Self-pay

## 2015-02-20 ENCOUNTER — Encounter: Payer: Self-pay | Admitting: Pharmacist

## 2015-02-24 ENCOUNTER — Encounter: Payer: Self-pay | Admitting: Pharmacist

## 2015-04-02 ENCOUNTER — Emergency Department: Payer: Self-pay

## 2015-04-02 ENCOUNTER — Emergency Department
Admission: EM | Admit: 2015-04-02 | Discharge: 2015-04-02 | Disposition: A | Discharge status: Home / Self Care | Payer: Self-pay | Attending: Emergency Medicine | Admitting: Emergency Medicine

## 2015-04-02 ENCOUNTER — Encounter: Payer: Self-pay | Admitting: Emergency Medicine

## 2015-04-02 DIAGNOSIS — F17210 Nicotine dependence, cigarettes, uncomplicated: Secondary | ICD-10-CM | POA: Insufficient documentation

## 2015-04-02 DIAGNOSIS — Z79899 Other long term (current) drug therapy: Secondary | ICD-10-CM | POA: Insufficient documentation

## 2015-04-02 DIAGNOSIS — Z91199 Patient's noncompliance with other medical treatment and regimen due to unspecified reason: Secondary | ICD-10-CM

## 2015-04-02 DIAGNOSIS — G40909 Epilepsy, unspecified, not intractable, without status epilepticus: Secondary | ICD-10-CM | POA: Insufficient documentation

## 2015-04-02 DIAGNOSIS — F1010 Alcohol abuse, uncomplicated: Secondary | ICD-10-CM | POA: Insufficient documentation

## 2015-04-02 DIAGNOSIS — F1410 Cocaine abuse, uncomplicated: Secondary | ICD-10-CM | POA: Insufficient documentation

## 2015-04-02 DIAGNOSIS — R569 Unspecified convulsions: Secondary | ICD-10-CM

## 2015-04-02 DIAGNOSIS — Z9119 Patient's noncompliance with other medical treatment and regimen: Secondary | ICD-10-CM | POA: Insufficient documentation

## 2015-04-02 LAB — URINALYSIS COMPLETE WITH MICROSCOPIC (ARMC ONLY)
BILIRUBIN URINE: NEGATIVE
Bacteria, UA: NONE SEEN
GLUCOSE, UA: NEGATIVE mg/dL
Ketones, ur: NEGATIVE mg/dL
LEUKOCYTES UA: NEGATIVE
Nitrite: NEGATIVE
Protein, ur: 30 mg/dL — AB
Specific Gravity, Urine: 1.002 — ABNORMAL LOW (ref 1.005–1.030)
pH: 5 (ref 5.0–8.0)

## 2015-04-02 LAB — BASIC METABOLIC PANEL
Anion gap: 13 (ref 5–15)
BUN: 8 mg/dL (ref 6–20)
CALCIUM: 9 mg/dL (ref 8.9–10.3)
CO2: 19 mmol/L — ABNORMAL LOW (ref 22–32)
CREATININE: 0.59 mg/dL (ref 0.44–1.00)
Chloride: 106 mmol/L (ref 101–111)
GFR calc Af Amer: >60 mL/min (ref >60)
GLUCOSE: 84 mg/dL (ref 65–99)
Potassium: 4.3 mmol/L (ref 3.5–5.1)
Sodium: 138 mmol/L (ref 135–145)

## 2015-04-02 LAB — URINE DRUG SCREEN, QUALITATIVE (ARMC ONLY)
AMPHETAMINES, UR SCREEN: NOT DETECTED
BARBITURATES, UR SCREEN: NOT DETECTED
Benzodiazepine, Ur Scrn: NOT DETECTED
COCAINE METABOLITE, UR ~~LOC~~: POSITIVE — AB
Cannabinoid 50 Ng, Ur ~~LOC~~: NOT DETECTED
MDMA (Ecstasy)Ur Screen: NOT DETECTED
METHADONE SCREEN, URINE: NOT DETECTED
OPIATE, UR SCREEN: NOT DETECTED
Phencyclidine (PCP) Ur S: NOT DETECTED
Tricyclic, Ur Screen: NOT DETECTED

## 2015-04-02 LAB — RAPID HIV SCREEN (HIV 1/2 AB+AG)
HIV 1/2 Antibodies: NONREACTIVE
HIV-1 P24 Antigen - HIV24: NONREACTIVE

## 2015-04-02 LAB — PHENYTOIN LEVEL, TOTAL: Phenytoin Lvl: <2.5 ug/mL — ABNORMAL LOW (ref 10.0–20.0)

## 2015-04-02 LAB — CBC WITH DIFFERENTIAL/PLATELET
BASOS ABS: 0.1 10*3/uL (ref 0–0.1)
BASOS PCT: 2 %
EOS ABS: 0 10*3/uL (ref 0–0.7)
EOS PCT: 0 %
HCT: 44.9 % (ref 35.0–47.0)
Hemoglobin: 15.2 g/dL (ref 12.0–16.0)
LYMPHS PCT: 30 %
Lymphs Abs: 1.5 10*3/uL (ref 1.0–3.6)
MCH: 32.1 pg (ref 26.0–34.0)
MCHC: 34 g/dL (ref 32.0–36.0)
MCV: 94.3 fL (ref 80.0–100.0)
MONO ABS: 0.4 10*3/uL (ref 0.2–0.9)
Monocytes Relative: 9 %
Neutro Abs: 2.9 10*3/uL (ref 1.4–6.5)
Neutrophils Relative %: 59 %
PLATELETS: 127 10*3/uL — AB (ref 150–440)
RBC: 4.76 MIL/uL (ref 3.80–5.20)
RDW: 13 % (ref 11.5–14.5)
WBC: 4.8 10*3/uL (ref 3.6–11.0)

## 2015-04-02 LAB — VALPROIC ACID LEVEL

## 2015-04-02 MED ORDER — PHENYTOIN SODIUM EXTENDED 100 MG PO CAPS: 300.0000 mg | ORAL_CAPSULE | Freq: Every day | ORAL | Status: DC

## 2015-04-02 MED ORDER — DIVALPROEX SODIUM 500 MG PO DR TAB: 500.0000 mg | DELAYED_RELEASE_TABLET | Freq: Two times a day (BID) | ORAL | Status: DC

## 2015-04-02 MED ORDER — LORAZEPAM 2 MG/ML IJ SOLN
2.0000 mg | Freq: Once | INTRAMUSCULAR | Status: AC
  Administered 2015-04-02: 2 mg via INTRAVENOUS

## 2015-04-02 MED ORDER — PHENYTOIN SODIUM 50 MG/ML IJ SOLN
15.0000 mg/kg | Freq: Once | INTRAMUSCULAR | Status: AC
  Administered 2015-04-02: 715 mg via INTRAVENOUS
  Filled 2015-04-02: qty 14.3

## 2015-04-02 MED ORDER — SODIUM CHLORIDE 0.9 % IV SOLN: 15.0000 mg/kg | Freq: Once | INTRAVENOUS | Status: DC

## 2015-04-02 MED ORDER — DIVALPROEX SODIUM 500 MG PO DR TAB
500.0000 mg | DELAYED_RELEASE_TABLET | Freq: Once | ORAL | Status: AC
  Administered 2015-04-02: 500 mg via ORAL
  Filled 2015-04-02: qty 1

## 2015-04-02 MED ORDER — LORAZEPAM 2 MG/ML IJ SOLN
INTRAMUSCULAR | Status: AC
  Filled 2015-04-02: qty 1

## 2015-04-02 NOTE — ED Provider Notes (Addendum)
[facility] Emergency Department Provider Note  ____________________________________________   I have reviewed the triage vital signs and the nursing notes.   HISTORY  Chief Complaint Seizures    HPI [name] is a 50 y.o. female with a history of seizure disorder had real seizures tonight. Patient has not taken her Dilantin or Depakote for 3 weeks. Now that she is awake, she denies any antecedent chest pain shows breath nausea vomiting fever or headache or other complaint. She states that she just ran out of her medications and had some seizures. Patient has a history of seizure disorder cocaine abuse.  Past Medical History  Diagnosis Date  . Allergy   . Seizures (HCC)   . Seizure [facility])     Patient Active Problem List   Diagnosis Date Noted  . Cocaine abuse 08/22/2014  . Seizure disorder (HCC) 08/21/2014    History reviewed. No pertinent past surgical history.  Current Outpatient Rx  Name  Route  Sig  Dispense  Refill  . divalproex (DEPAKOTE) 500 MG DR tablet   Oral   Take 1 tablet (500 mg total) by mouth every 12 (twelve) hours. Patient taking differently: Take 500 mg by mouth 3 (three) times daily.    60 tablet   0   . haloperidol (HALDOL) 2 MG tablet   Oral   Take 1 mg by mouth at bedtime.         . phenytoin (DILANTIN) 100 MG ER capsule   Oral   Take 3 capsules (300 mg total) by mouth daily. Patient taking differently: Take 100 mg by mouth 3 (three) times daily.    30 capsule   0   . traZODone (DESYREL) 100 MG tablet   Oral   Take 100 mg by mouth at bedtime.         Marland Kitchen azithromycin (ZITHROMAX) 250 MG tablet   Oral   Take 1 tablet (250 mg total) by mouth daily. Patient not taking: Reported on 04/02/2015   4 each   0   . chlorpheniramine-HYDROcodone (TUSSIONEX PENNKINETIC ER) 10-8 MG/5ML SUER   Oral   Take 5 mLs by mouth 2 (two) times daily. Patient not taking: Reported on 04/02/2015   70 mL   0   . divalproex  (DEPAKOTE) 500 MG DR tablet   Oral   Take 1 tablet (500 mg total) by mouth 2 (two) times daily. Patient not taking: Reported on 12/13/2014   30 tablet   0   . ibuprofen (ADVIL,MOTRIN) 800 MG tablet   Oral   Take 1 tablet (800 mg total) by mouth every 8 (eight) hours as needed. Patient not taking: Reported on 12/13/2014   30 tablet   0   . oxyCODONE-acetaminophen (ROXICET) 5-325 MG per tablet   Oral   Take 1-2 tablets by mouth every 4 (four) hours as needed for severe pain. Patient not taking: Reported on 12/13/2014   15 tablet   0     Allergies Flagyl  No family history on file.  Social History Social History  Substance Use Topics  . Smoking status: Current Every Day Smoker -- 0.20 packs/day    Types: Cigarettes  . Smokeless tobacco: None  . Alcohol Use: Yes    Review of Systems Constitutional: No fever/chills Eyes: No visual changes. ENT: No sore throat. No stiff neck no neck pain Cardiovascular: Denies chest pain. Respiratory: Denies shortness of breath. Gastrointestinal:   no vomiting.  No diarrhea.  No constipation. Genitourinary: Negative  for dysuria. Musculoskeletal: Negative lower extremity swelling Skin: Negative for rash. Neurological: Negative for headaches, focal weakness or numbness. 10-point ROS otherwise negative.  ____________________________________________   PHYSICAL EXAM:  VITAL SIGNS: ED Triage Vitals  Enc Vitals Group     BP 04/02/15 0212 117/94 mmHg     Pulse Rate 04/02/15 0212 112     Resp 04/02/15 0212 12     Temp 04/02/15 0212 98.2 F (36.8 C)     Temp Source 04/02/15 0212 Oral     SpO2 04/02/15 0212 96 %     Weight 04/02/15 0212 105 lb (47.628 kg)     Height 04/02/15 0212 5' (1.524 m)     Head Cir --      Peak Flow --      Pain Score --      Pain Loc --      Pain Edu? --      Excl. in GC? --     Constitutional: She is initially somewhat confused and postictal but now awake and alert and answering questions. Eyes:  Conjunctivae are normal. PERRL. EOMI. Head: Atraumatic. Nose: No congestion/rhinnorhea. Mouth/Throat: Mucous membranes are moist.  Oropharynx non-erythematous. Neck: No stridor.   Nontender with no meningismus Cardiovascular: Normal rate, regular rhythm. Grossly normal heart sounds.  Good peripheral circulation. Respiratory: Normal respiratory effort.  No retractions. Lungs CTAB. Abdominal: Soft and nontender. No distention. No guarding no rebound Back:  There is no focal tenderness or step off there is no midline tenderness there are no lesions noted. there is no CVA tenderness} Musculoskeletal: No lower extremity tenderness. No joint effusions, no DVT signs strong distal pulses no edema Neurologic:  Normal speech and language. No gross focal neurologic deficits are appreciated.  Skin:  Skin is warm, dry and intact. No rash noted. Psychiatric: Mood and affect are normal. Speech and behavior are normal.  ____________________________________________   LABS (all labs ordered are listed, but only abnormal results are displayed)  Labs Reviewed  PHENYTOIN LEVEL, TOTAL - Abnormal; Notable for the following:    Phenytoin Lvl <2.5 (*)    All other components within normal limits  VALPROIC ACID LEVEL - Abnormal; Notable for the following:    Valproic Acid Lvl <10 (*)    All other components within normal limits  BASIC METABOLIC PANEL - Abnormal; Notable for the following:    CO2 19 (*)    All other components within normal limits  CBC WITH DIFFERENTIAL/PLATELET - Abnormal; Notable for the following:    Platelets 127 (*)    All other components within normal limits  URINALYSIS COMPLETEWITH MICROSCOPIC ([facility] ONLY) - Abnormal; Notable for the following:    Color, Urine STRAW (*)    APPearance CLEAR (*)    Specific Gravity, Urine 1.002 (*)    Hgb urine dipstick 2+ (*)    Protein, ur 30 (*)    Squamous Epithelial / LPF 0-5 (*)    All other components within normal limits  URINE DRUG  SCREEN, QUALITATIVE ([facility] ONLY) - Abnormal; Notable for the following:    Cocaine Metabolite,[facility] [place] POSITIVE (*)    All other components within normal limits  CBG MONITORING, ED   ____________________________________________  EKG  I personally interpreted any EKGs ordered by me or triage  ____________________________________________  RADIOLOGY  I reviewed any imaging ordered by me or triage that were performed during my shift ____________________________________________   PROCEDURES  Procedure(s) performed: None  Critical Care performed: None  ____________________________________________   INITIAL IMPRESSION /  ASSESSMENT AND PLAN / ED COURSE  Pertinent labs & imaging results that were available during my care of the patient were reviewed by me and considered in my medical decision making (see chart for details).  Patient with seizures in the context of not compliance with her seizure medication. Her levels are low. We will give her loading doses of Dilantin and patient states she does take valproic acid as well as we will give her a dose of that here. No other obvious cause of seizure noted. No significant injury noted on exam. Blood work and urine reassuring. We'll continue to observe the patient. Given multiple seizures one could argue for admission however given noncompliance seems the remedy is easily available. Patient has been full different visits for similar.  ----------------------------------------- 7:08 AM on 04/02/2015 -----------------------------------------  She continues to wake up well. She administered Smells Somewhat of Alcohol Tonight. No Evidence of Withdrawal. We Have  not checked EtOH but usually she is around 200 which seems consistent with her presentation. She is still somewhat unsteady on her feet but otherwise nonfocal. I have loaded her Dilantin and Depakote. We will write her a prescription for those entities. I do not think the patient requires  acute inpatient care at this time as him and she continues to sober and can ambulate well soon.     FINAL CLINICAL IMPRESSION(S) / ED DIAGNOSES  Final diagnoses:  None      This chart was dictated using voice recognition software.  Despite best efforts to proofread,  errors can occur which can change meaning.     Jeanmarie Plant, MD 04/02/15 [id number]  Jeanmarie Plant, MD 04/02/15 [phone]

## 2015-04-02 NOTE — Discharge Instructions (Signed)
Alcohol Abuse and Nutrition Alcohol abuse is any pattern of alcohol consumption that harms your health, relationships, or work. Alcohol abuse can affect how your body breaks down and absorbs nutrients from food by causing your liver to work abnormally. Additionally, many people who abuse alcohol do not eat enough carbohydrates, protein, fat, vitamins, and minerals. This can cause poor nutrition (malnutrition) and a lack of nutrients (nutrient deficiencies), which can lead to further complications. Nutrients that are commonly lacking (deficient) among people who abuse alcohol include:  Vitamins.  Vitamin A. This is stored in your liver. It is important for your vision, metabolism, and ability to fight off infections (immunity).  B vitamins. These include vitamins such as folate, thiamin, and niacin. These are important in new cell growth and maintenance.  Vitamin C. This plays an important role in iron absorption, wound healing, and immunity.  Vitamin D. This is produced by your liver, but you can also get vitamin D from food. Vitamin D is necessary for your body to absorb and use calcium.  Minerals.  Calcium. This is important for your bones and your heart and blood vessel (cardiovascular) function.  Iron. This is important for blood, muscle, and nervous system functioning.  Magnesium. This plays an important role in muscle and nerve function, and it helps to control blood sugar and blood pressure.  Zinc. This is important for the normal function of your nervous system and digestive system (gastrointestinal tract). Nutrition is an essential component of therapy for alcohol abuse. Your health care provider or dietitian will work with you to design a plan that can help restore nutrients to your body and prevent potential complications. WHAT IS MY PLAN? Your dietitian may develop a specific diet plan that is based on your condition and any other complications you may have. A diet plan will  commonly include:  A balanced diet.  Grains: 6-8 oz per day.  Vegetables: 2-3 cups per day.  Fruits: 1-2 cups per day.  Meat and other protein: 5-6 oz per day.  Dairy: 2-3 cups per day.  Vitamin and mineral supplements. WHAT DO I NEED TO KNOW ABOUT ALCOHOL AND NUTRITION?  Consume foods that are high in antioxidants, such as grapes, berries, nuts, green tea, and dark green and orange vegetables. This can help to counteract some of the stress that is placed on your liver by consuming alcohol.  Avoid food and drinks that are high in fat and sugar. Foods such as sugared soft drinks, salty snack foods, and candy contain empty calories. This means that they lack important nutrients such as protein, fiber, and vitamins.  Eat frequent meals and snacks. Try to eat 5-6 small meals each day.  Eat a variety of fresh fruits and vegetables each day. This will help you get plenty of water, fiber, and vitamins in your diet.  Drink plenty of water and other clear fluids. Try to drink at least 48-64 oz (1.5-2 L) of water per day.  If you are a vegetarian, eat a variety of protein-rich foods. Pair whole grains with plant-based proteins at meals and snacks to obtain the greatest nutrient benefit from your food. For example, eat rice with beans, put peanut butter on whole-grain toast, or eat oatmeal with sunflower seeds.  Soak beans and whole grains overnight before cooking. This can help your body to absorb the nutrients more easily.  Include foods fortified with vitamins and minerals in your diet. Commonly fortified foods include milk, orange juice, cereal, and bread.  If you  are malnourished, your dietitian may recommend a high-protein, high-calorie diet. This may include: °¨ 2,000-3,000 calories (kilocalories) per day. °¨ 70-100 grams of protein per day. °· Your health care provider may recommend a complete nutritional supplement beverage. This can help to restore calories, protein, and vitamins to  your body. Depending on your condition, you may be advised to consume this instead of or in addition to meals. °· Limit your intake of caffeine. Replace drinks like coffee and black tea with decaffeinated coffee and herbal tea. °· Eat a variety of foods that are high in omega fatty acids. These include fish, nuts and seeds, and soybeans. These foods may help your liver to recover and may also stabilize your mood. °· Certain medicines may cause changes in your appetite, taste, and weight. Work with your health care provider and dietitian to make any adjustments to your medicines and diet plan. °· Include other healthy lifestyle choices in your daily routine. °¨ Be physically active. °¨ Get enough sleep. °¨ Spend time doing activities that you enjoy. °· If you are unable to take in enough food and calories by mouth, your health care provider may recommend a feeding tube. This is a tube that passes through your nose and throat, directly into your stomach. Nutritional supplement beverages can be given to you through the feeding tube to help you get the nutrients you need. °· Take vitamin or mineral supplements as recommended by your health care provider. °WHAT FOODS CAN I EAT? °Grains °Enriched pasta. Enriched rice. Fortified whole-grain bread. Fortified whole-grain cereal. Barley. Brown rice. Quinoa. Millet. °Vegetables °All fresh, frozen, and canned vegetables. Spinach. Kale. Artichoke. Carrots. Winter squash and pumpkin. Sweet potatoes. Broccoli. Cabbage. Cucumbers. Tomatoes. Sweet peppers. Green beans. Peas. Corn. °Fruits °All fresh and frozen fruits. Berries. Grapes. Mango. Papaya. Guava. Cherries. Apples. Bananas. Peaches. Plums. Pineapple. Watermelon. Cantaloupe. Oranges. Avocado. °Meats and Other Protein Sources °Beef liver. Lean beef. Pork. Fresh and canned chicken. Fresh fish. Oysters. Sardines. Canned tuna. Shrimp. Eggs with yolks. Nuts and seeds. Peanut butter. Beans and lentils. Soybeans.  Tofu. °Dairy °Whole, low-fat, and nonfat milk. Whole, low-fat, and nonfat yogurt. Cottage cheese. Sour cream. Hard and soft cheeses. °Beverages °Water. Herbal tea. Decaffeinated coffee. Decaffeinated green tea. 100% fruit juice. 100% vegetable juice. Instant breakfast shakes. °Condiments °Ketchup. Mayonnaise. Mustard. Salad dressing. Barbecue sauce. °Sweets and Desserts °Sugar-free ice cream. Sugar-free pudding. Sugar-free gelatin. °Fats and Oils °Butter. Vegetable oil, flaxseed oil, olive oil, and walnut oil. °Other °Complete nutrition shakes. Protein bars. Sugar-free gum. °The items listed above may not be a complete list of recommended foods or beverages. Contact your dietitian for more options. °WHAT FOODS ARE NOT RECOMMENDED? °Grains °Sugar-sweetened breakfast cereals. Flavored instant oatmeal. Fried breads. °Vegetables °Breaded or deep-fried vegetables. °Fruits °Dried fruit with added sugar. Candied fruit. Canned fruit in syrup. °Meats and Other Protein Sources °Breaded or deep-fried meats. °Dairy °Flavored milks. Fried cheese curds or fried cheese sticks. °Beverages °Alcohol. Sugar-sweetened soft drinks. Sugar-sweetened tea. Caffeinated coffee and tea. °Condiments °Sugar. Honey. Agave nectar. Molasses. °Sweets and Desserts °Chocolate. Cake. Cookies. Candy. °Other °Potato chips. Pretzels. Salted nuts. Candied nuts. °The items listed above may not be a complete list of foods and beverages to avoid. Contact your dietitian for more information. °  °This information is not intended to replace advice given to you by your health care provider. Make sure you discuss any questions you have with your health care provider. °  °Document Released: 11/15/2004 Document Revised: 02/11/2014 Document Reviewed: 08/24/2013 °Elsevier Interactive Patient   Education 2016 [employer].  Epilepsy Epilepsy is a disorder in which a person has repeated seizures over time. A seizure is a release of abnormal electrical activity in the  brain. Seizures can cause a change in attention, behavior, or the ability to remain awake and alert (altered mental status). Seizures often involve uncontrollable shaking (convulsions).  Most people with epilepsy lead normal lives. However, people with epilepsy are at an increased risk of falls, accidents, and injuries. Therefore, it is important to begin treatment right away. CAUSES  Epilepsy has many possible causes. Anything that disturbs the normal pattern of brain cell activity can lead to seizures. This may include:   Head injury.  Birth trauma.  High fever as a child.  Stroke.  Bleeding into or around the brain.  Certain drugs.  Prolonged low oxygen, such as what occurs after CPR efforts.  Abnormal brain development.  Certain illnesses, such as meningitis, encephalitis (brain infection), malaria, and other infections.  An imbalance of nerve signaling chemicals (neurotransmitters).  SIGNS AND SYMPTOMS  The symptoms of a seizure can vary greatly from one person to another. Right before a seizure, you may have a warning (aura) that a seizure is about to occur. An aura may include the following symptoms:  Fear or anxiety.  Nausea.  Feeling like the room is spinning (vertigo).  Vision changes, such as seeing flashing lights or spots. Common symptoms during a seizure include:  Abnormal sensations, such as an abnormal smell or a bitter taste in the mouth.   Sudden, general body stiffness.   Convulsions that involve rhythmic jerking of the face, arm, or leg on one or both sides.   Sudden change in consciousness.   Appearing to be awake but not responding.   Appearing to be asleep but cannot be awakened.   Grimacing, chewing, lip smacking, drooling, tongue biting, or loss of bowel or bladder control. After a seizure, you may feel sleepy for a while. DIAGNOSIS  Your health care provider will ask about your symptoms and take a medical history. Descriptions from  any witnesses to your seizures will be very helpful in the diagnosis. A physical exam, including a detailed neurological exam, is necessary. Various tests may be done, such as:   An electroencephalogram (EEG). This is a painless test of your brain waves. In this test, a diagram is created of your brain waves. These diagrams can be interpreted by a specialist.  An MRI of the brain.   A CT scan of the brain.   A spinal tap (lumbar puncture, LP).  Blood tests to check for signs of infection or abnormal blood chemistry. TREATMENT  There is no cure for epilepsy, but it is generally treatable. Once epilepsy is diagnosed, it is important to begin treatment as soon as possible. For most people with epilepsy, seizures can be controlled with medicines. The following may also be used:  A pacemaker for the brain (vagus nerve stimulator) can be used for people with seizures that are not well controlled by medicine.  Surgery on the brain. For some people, epilepsy eventually goes away. HOME CARE INSTRUCTIONS   Follow your health care provider's recommendations on driving and safety in normal activities.  Get enough rest. Lack of sleep can cause seizures.  Only take over-the-counter or prescription medicines as directed by your health care provider. Take any prescribed medicine exactly as directed.  Avoid any known triggers of your seizures.  Keep a seizure diary. Record what you recall about any seizure, especially  any possible trigger.   Make sure the people you live and work with know that you are prone to seizures. They should receive instructions on how to help you. In general, a witness to a seizure should:   Cushion your head and body.   Turn you on your side.   Avoid unnecessarily restraining you.   Not place anything inside your mouth.   Call for emergency medical help if there is any question about what has occurred.   Follow up with your health care provider as  directed. You may need regular blood tests to monitor the levels of your medicine.  SEEK MEDICAL CARE IF:   You develop signs of infection or other illness. This might increase the risk of a seizure.   You seem to be having more frequent seizures.   Your seizure pattern is changing.  SEEK IMMEDIATE MEDICAL CARE IF:   You have a seizure that does not stop after a few moments.   You have a seizure that causes any difficulty in breathing.   You have a seizure that results in a very severe headache.   You have a seizure that leaves you with the inability to speak or use a part of your body.    This information is not intended to replace advice given to you by your health care provider. Make sure you discuss any questions you have with your health care provider.   Document Released: 01/21/2005 Document Revised: 11/11/2012 Document Reviewed: 09/02/2012 Elsevier Interactive Patient Education [employer]

## 2015-04-02 NOTE — ED Notes (Signed)
Per [facility] pt was coming from [place] house and had two witnessed seizures with firemen and two witnessed seizures with EMS. Pt reports being out of Dilantin x3 weeks. Pt is combative and alert to self only at this time in triage.

## 2015-04-02 NOTE — ED Notes (Addendum)
Ambulated patient in room.  Ambulates well with minimal assistance. Patient states she uses a walker at home and that gait is at baseline.  Dr. Shaune Pollack alerted and at bedside to discuss discharge.

## 2015-04-02 NOTE — ED Notes (Signed)
AAOx3.  Skin warm and dry.  NAD 

## 2015-04-03 LAB — HIV ANTIBODY (ROUTINE TESTING W REFLEX): HIV SCREEN 4TH GENERATION: NONREACTIVE

## 2015-05-16 ENCOUNTER — Ambulatory Visit: Payer: Self-pay | Admitting: Family Medicine

## 2015-05-16 VITALS — BP 121/80 | HR 92 | Resp 16 | Ht 60.0 in | Wt 98.0 lb

## 2015-05-16 DIAGNOSIS — L309 Dermatitis, unspecified: Secondary | ICD-10-CM | POA: Insufficient documentation

## 2015-05-16 DIAGNOSIS — F1411 Cocaine abuse, in remission: Secondary | ICD-10-CM

## 2015-05-16 DIAGNOSIS — G40909 Epilepsy, unspecified, not intractable, without status epilepticus: Secondary | ICD-10-CM

## 2015-05-16 MED ORDER — DIVALPROEX SODIUM 500 MG PO DR TAB: 500.0000 mg | DELAYED_RELEASE_TABLET | Freq: Three times a day (TID) | ORAL | Status: DC

## 2015-05-16 MED ORDER — TRIAMCINOLONE ACETONIDE 0.1 % EX CREA: 1.0000 "application " | TOPICAL_CREAM | Freq: Two times a day (BID) | CUTANEOUS | Status: DC

## 2015-05-16 MED ORDER — PHENYTOIN SODIUM EXTENDED 100 MG PO CAPS: 100.0000 mg | ORAL_CAPSULE | Freq: Three times a day (TID) | ORAL | Status: DC

## 2015-05-16 NOTE — Progress Notes (Signed)
Name: [name]   MRN: [id number]    DOB: Dec 01, 1965   Date:05/16/2015       Progress Note  Subjective  Chief Complaint  Chief Complaint  Patient presents with  . Seizures    need refills  . Rash    left forearm    Seizures  This is a recurrent problem. The problem has not changed since onset.There were more than 10 seizures. The most recent episode lasted 2 to 5 minutes. Characteristics include rhythmic jerking and loss of consciousness. Characteristics do not include bowel incontinence or bladder incontinence. The episode was witnessed.  Rash This is a new problem. The current episode started 1 to 4 weeks ago. The problem has been gradually worsening since onset. The affected locations include the left arm. Pertinent negatives include no fever. (Hyperpigmented from scratching. ) Past treatments include topical steroids. The treatment provided mild relief.       Past Medical History  Diagnosis Date  . Allergy   . Seizures ([place])   . Seizure [facility])     Social History  Substance Use Topics  . Smoking status: Current Every Day Smoker -- 0.50 packs/day    Types: Cigarettes  . Smokeless tobacco: Not on file  . Alcohol Use: Yes     Current outpatient prescriptions:  .  divalproex (DEPAKOTE) 500 MG DR tablet, Take 1 tablet (500 mg total) by mouth every 12 (twelve) hours., Disp: 60 tablet, Rfl: 0 .  haloperidol (HALDOL) 2 MG tablet, Take 1 mg by mouth at bedtime., Disp: , Rfl:  .  phenytoin (DILANTIN) 100 MG ER capsule, Take 3 capsules (300 mg total) by mouth daily., Disp: 30 capsule, Rfl: 0 .  traZODone (DESYREL) 100 MG tablet, Take 100 mg by mouth at bedtime., Disp: , Rfl:  .  azithromycin (ZITHROMAX) 250 MG tablet, Take 1 tablet (250 mg total) by mouth daily. (Patient not taking: Reported on 04/02/2015), Disp: 4 each, Rfl: 0 .  chlorpheniramine-HYDROcodone (TUSSIONEX PENNKINETIC ER) 10-8 MG/5ML SUER, Take 5 mLs by mouth 2 (two) times daily. (Patient not taking: Reported on  04/02/2015), Disp: 70 mL, Rfl: 0 .  ibuprofen (ADVIL,MOTRIN) 800 MG tablet, Take 1 tablet (800 mg total) by mouth every 8 (eight) hours as needed. (Patient not taking: Reported on 12/13/2014), Disp: 30 tablet, Rfl: 0 .  oxyCODONE-acetaminophen (ROXICET) 5-325 MG per tablet, Take 1-2 tablets by mouth every 4 (four) hours as needed for severe pain. (Patient not taking: Reported on 12/13/2014), Disp: 15 tablet, Rfl: 0  Allergies  Allergen Reactions  . Flagyl [Metronidazole] Other (See Comments)    "my eyes go back in the back of my head"    Review of Systems  Constitutional: Negative for fever and chills.  Gastrointestinal: Negative for bowel incontinence.  Genitourinary: Negative for bladder incontinence.  Skin: Positive for rash.  Neurological: Positive for seizures (Last about a week ago. Out of medications. ) and loss of consciousness.    Objective  Filed Vitals:   05/16/15 1742  BP: 121/80  Pulse: 92  Resp: 16  Height: 5' (1.524 m)  Weight: 98 lb (44.453 kg)     Physical Exam  Constitutional: She is oriented to person, place, and time and well-developed, well-nourished, and in no distress.  Neurological: She is alert and oriented to person, place, and time.  Skin:  Hyperpigmented patch on left forearm.   Psychiatric: Mood, memory, affect and judgment normal.     Recent Results (from the past 2160 hour(s))  Phenytoin level,  total     Status: Abnormal   Collection Time: 04/02/15  2:14 AM  Result Value Ref Range   Phenytoin Lvl <2.5 (L) 10.0 - 20.0 ug/mL  Valproic acid level     Status: Abnormal   Collection Time: 04/02/15  2:14 AM  Result Value Ref Range   Valproic Acid Lvl <10 (L) 50.0 - 100.0 ug/mL  Basic metabolic panel     Status: Abnormal   Collection Time: 04/02/15  2:14 AM  Result Value Ref Range   Sodium 138 135 - 145 mmol/L   Potassium 4.3 3.5 - 5.1 mmol/L    Comment: HEMOLYSIS AT THIS LEVEL MAY AFFECT RESULT   Chloride 106 101 - 111 mmol/L   CO2 19 (L) 22  - 32 mmol/L   Glucose, Bld 84 65 - 99 mg/dL   BUN 8 6 - 20 mg/dL   Creatinine, Ser 0.19 0.44 - 1.00 mg/dL   Calcium 9.0 8.9 - 73.6 mg/dL   GFR calc non Af Amer >60 >60 mL/min   GFR calc Af Amer >60 >60 mL/min    Comment: (NOTE) The eGFR has been calculated using the CKD EPI equation. This calculation has not been validated in all clinical situations. eGFR's persistently <60 mL/min signify possible Chronic Kidney Disease.    Anion gap 13 5 - 15  CBC with Differential     Status: Abnormal   Collection Time: 04/02/15  2:14 AM  Result Value Ref Range   WBC 4.8 3.6 - 11.0 K/uL   RBC 4.76 3.80 - 5.20 MIL/uL   Hemoglobin 15.2 12.0 - 16.0 g/dL   HCT 14.9 29.8 - 82.1 %   MCV 94.3 80.0 - 100.0 fL   MCH 32.1 26.0 - 34.0 pg   MCHC 34.0 32.0 - 36.0 g/dL   RDW 36.5 36.6 - 40.1 %   Platelets 127 (L) 150 - 440 K/uL   Neutrophils Relative % 59 %   Neutro Abs 2.9 1.4 - 6.5 K/uL   Lymphocytes Relative 30 %   Lymphs Abs 1.5 1.0 - 3.6 K/uL   Monocytes Relative 9 %   Monocytes Absolute 0.4 0.2 - 0.9 K/uL   Eosinophils Relative 0 %   Eosinophils Absolute 0.0 0 - 0.7 K/uL   Basophils Relative 2 %   Basophils Absolute 0.1 0 - 0.1 K/uL  Urinalysis complete, with microscopic     Status: Abnormal   Collection Time: 04/02/15  2:14 AM  Result Value Ref Range   Color, Urine STRAW (A) YELLOW   APPearance CLEAR (A) CLEAR   Glucose, UA NEGATIVE NEGATIVE mg/dL   Bilirubin Urine NEGATIVE NEGATIVE   Ketones, ur NEGATIVE NEGATIVE mg/dL   Specific Gravity, Urine 1.002 (L) 1.005 - 1.030   Hgb urine dipstick 2+ (A) NEGATIVE   pH 5.0 5.0 - 8.0   Protein, ur 30 (A) NEGATIVE mg/dL   Nitrite NEGATIVE NEGATIVE   Leukocytes, UA NEGATIVE NEGATIVE   RBC / HPF 0-5 0 - 5 RBC/hpf   WBC, UA 0-5 0 - 5 WBC/hpf   Bacteria, UA NONE SEEN NONE SEEN   Squamous Epithelial / LPF 0-5 (A) NONE SEEN   Mucous PRESENT   Urine Drug Screen, Qualitative     Status: Abnormal   Collection Time: 04/02/15  2:14 AM  Result Value Ref  Range   Tricyclic, Ur Screen NONE DETECTED NONE DETECTED   Amphetamines, Ur Screen NONE DETECTED NONE DETECTED   MDMA (Ecstasy)Ur Screen NONE DETECTED NONE DETECTED   Cocaine Metabolite,Ur [place]  POSITIVE (A) NONE DETECTED   Opiate, Ur Screen NONE DETECTED NONE DETECTED   Phencyclidine (PCP) Ur S NONE DETECTED NONE DETECTED   Cannabinoid 50 Ng, Ur  NONE DETECTED NONE DETECTED   Barbiturates, Ur Screen NONE DETECTED NONE DETECTED   Benzodiazepine, Ur Scrn NONE DETECTED NONE DETECTED   Methadone Scn, Ur NONE DETECTED NONE DETECTED    Comment: (NOTE) 100  Tricyclics, urine               Cutoff 1000 ng/mL 200  Amphetamines, urine             Cutoff 1000 ng/mL 300  MDMA (Ecstasy), urine           Cutoff 500 ng/mL 400  Cocaine Metabolite, urine       Cutoff 300 ng/mL 500  Opiate, urine                   Cutoff 300 ng/mL 600  Phencyclidine (PCP), urine      Cutoff 25 ng/mL 700  Cannabinoid, urine              Cutoff 50 ng/mL 800  Barbiturates, urine             Cutoff 200 ng/mL 900  Benzodiazepine, urine           Cutoff 200 ng/mL 1000 Methadone, urine                Cutoff 300 ng/mL 1100 1200 The urine drug screen provides only a preliminary, unconfirmed 1300 analytical test result and should not be used for non-medical 1400 purposes. Clinical consideration and professional judgment should 1500 be applied to any positive drug screen result due to possible 1600 interfering substances. A more specific alternate chemical method 1700 must be used in order to obtain a confirmed analytical result.  1800 Gas chromato graphy / mass spectrometry (GC/MS) is the preferred 1900 confirmatory method.   Rapid HIV screen (HIV 1/2 Ab+Ag)     Status: None   Collection Time: 04/02/15  2:14 AM  Result Value Ref Range   HIV-1 P24 Antigen - HIV24 NON REACTIVE NON REACTIVE   HIV 1/2 Antibodies NON REACTIVE NON REACTIVE   Interpretation (HIV Ag Ab)      A non reactive test result means that HIV 1 or HIV 2  antibodies and HIV 1 p24 antigen were not detected in the specimen.  HIV antibody     Status: None   Collection Time: 04/02/15  7:49 AM  Result Value Ref Range   HIV Screen 4th Generation wRfx Non Reactive Non Reactive    Comment: (NOTE) Performed At: [facility] [address] [place], [place] [id number] Mila Homer MD UR:[id number]      Assessment & Plan 1. Seizure disorder (HCC) Stable when on medication. Send off your medication today.  Needs labs recheck in one month.   - divalproex (DEPAKOTE) 500 MG DR tablet; Take 1 tablet (500 mg total) by mouth 3 (three) times daily.  Dispense: 270 tablet; Refill: 3 - phenytoin (DILANTIN) 100 MG ER capsule; Take 1 capsule (100 mg total) by mouth 3 (three) times daily.  Dispense: 270 capsule; Refill: 3  2. Cocaine abuse in remission Stable.   3. Eczema New problem Condition is worsening. Will start medication for better control.  Patient instructed to call back if condition worsens or does not improve.    - triamcinolone cream (KENALOG) 0.1 %; Apply 1 application topically 2 (two) times daily.  Dispense: 30 g; Refill: 0  Margarita Rana, MD

## 2015-05-26 ENCOUNTER — Encounter: Payer: Self-pay | Admitting: Emergency Medicine

## 2015-05-26 ENCOUNTER — Emergency Department
Admission: EM | Admit: 2015-05-26 | Discharge: 2015-05-26 | Disposition: A | Discharge status: Home / Self Care | Payer: MEDICAID | Attending: Emergency Medicine | Admitting: Emergency Medicine

## 2015-05-26 DIAGNOSIS — G40909 Epilepsy, unspecified, not intractable, without status epilepticus: Secondary | ICD-10-CM | POA: Insufficient documentation

## 2015-05-26 DIAGNOSIS — F17210 Nicotine dependence, cigarettes, uncomplicated: Secondary | ICD-10-CM | POA: Insufficient documentation

## 2015-05-26 DIAGNOSIS — G8929 Other chronic pain: Secondary | ICD-10-CM | POA: Insufficient documentation

## 2015-05-26 DIAGNOSIS — M549 Dorsalgia, unspecified: Secondary | ICD-10-CM

## 2015-05-26 DIAGNOSIS — M419 Scoliosis, unspecified: Secondary | ICD-10-CM

## 2015-05-26 DIAGNOSIS — M4184 Other forms of scoliosis, thoracic region: Secondary | ICD-10-CM | POA: Insufficient documentation

## 2015-05-26 MED ORDER — HYDROCODONE-ACETAMINOPHEN 5-325 MG PO TABS: 1.0000 | ORAL_TABLET | ORAL | Status: DC | PRN

## 2015-05-26 MED ORDER — HYDROCODONE-ACETAMINOPHEN 5-325 MG PO TABS
1.0000 | ORAL_TABLET | Freq: Once | ORAL | Status: AC
Start: 2015-05-26 — End: 2015-05-26
  Administered 2015-05-26: 1 via ORAL
  Filled 2015-05-26: qty 1

## 2015-05-26 MED ORDER — DIAZEPAM 2 MG PO TABS
2.0000 mg | ORAL_TABLET | Freq: Once | ORAL | Status: AC
  Administered 2015-05-26: 2 mg via ORAL
  Filled 2015-05-26: qty 1

## 2015-05-26 MED ORDER — DIAZEPAM 2 MG PO TABS: 2.0000 mg | ORAL_TABLET | Freq: Three times a day (TID) | ORAL | Status: DC | PRN

## 2015-05-26 NOTE — ED Provider Notes (Signed)
[facility] Emergency Department Provider Note  ____________________________________________  Time seen: Approximately 12:24 PM  I have reviewed the triage vital signs and the nursing notes.   HISTORY  Chief Complaint Back Pain   HPI [name] is a 50 y.o. female is here with complaint of back pain. Patient states it is much worse on the left than the right. She states that pain started yesterday when she bent over to pick up a for coughs floor. She states she has a history of scoliosis and has problems with her back off and on. Patient is able to ambulate but with a limp due to her pain. She denies following while picking up the fork. Patient has taken Tylenol without any relief. She denies any urinary symptoms or history of kidney stone. She denies any incontinence of bowel or bladder. Today movement increases her pain. Patient rates her pain as an 8 out of 10.   Past Medical History  Diagnosis Date  . Allergy   . Seizures (HCC)   . Seizure [facility])     Patient Active Problem List   Diagnosis Date Noted  . Eczema 05/16/2015  . 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 Seizure disorder (HCC) 08/21/2014    History reviewed. No pertinent past surgical history.  Current Outpatient Rx  Name  Route  Sig  Dispense  Refill  . diazepam (VALIUM) 2 MG tablet   Oral   Take 1 tablet (2 mg total) by mouth every 8 (eight) hours as needed for muscle spasms.   9 tablet   0   . divalproex (DEPAKOTE) 500 MG DR tablet   Oral   Take 1 tablet (500 mg total) by mouth 3 (three) times daily.   270 tablet   3   . haloperidol (HALDOL) 2 MG tablet   Oral   Take 1 mg by mouth at bedtime.         Marland Kitchen HYDROcodone-acetaminophen (NORCO/VICODIN) 5-325 MG tablet   Oral   Take 1 tablet by mouth every 4 (four) hours as needed for moderate pain.   12 tablet   0   . phenytoin (DILANTIN) 100 MG ER capsule   Oral   Take 1 capsule (100 mg total) by mouth 3 (three) times  daily.   270 capsule   3   . traZODone (DESYREL) 100 MG tablet   Oral   Take 100 mg by mouth at bedtime.         . triamcinolone cream (KENALOG) 0.1 %   Topical   Apply 1 application topically 2 (two) times daily.   30 g   0     Allergies Flagyl  No family history on file.  Social History Social History  Substance Use Topics  . Smoking status: Current Every Day Smoker -- 0.50 packs/day    Types: Cigarettes  . Smokeless tobacco: None  . Alcohol Use: Yes    Review of Systems Constitutional: No fever/chills Cardiovascular: Denies chest pain. Respiratory: Denies shortness of breath. Gastrointestinal: No abdominal pain.  No nausea, no vomiting.  No diarrhea.  No constipation. Genitourinary: Negative for dysuria. Musculoskeletal: Positive for acute and chronic back pain. Skin: Negative for rash. Neurological: Negative for headaches, focal weakness or numbness.  10-point ROS otherwise negative.  ____________________________________________   PHYSICAL EXAM:  VITAL SIGNS: ED Triage Vitals  Enc Vitals Group     BP 05/26/15 1119 116/81 mmHg     Pulse Rate 05/26/15 1119 94  Resp 05/26/15 1119 18     Temp 05/26/15 1119 98.2 F (36.8 C)     Temp Source 05/26/15 1119 Oral     SpO2 05/26/15 1119 99 %     Weight 05/26/15 1119 93 lb (42.185 kg)     Height 05/26/15 1119 5\' 3"  (1.6 m)     Head Cir --      Peak Flow --      Pain Score 05/26/15 1117 8     Pain Loc --      Pain Edu? --      Excl. in GC? --     Constitutional: Alert and oriented. Well appearing and in no acute distress. Eyes: Conjunctivae are normal. PERRL. EOMI. Head: Atraumatic. Nose: No congestion/rhinnorhea. Neck: No stridor.   Cardiovascular: Normal rate, regular rhythm. Grossly normal heart sounds.  Good peripheral circulation. Respiratory: Normal respiratory effort.  No retractions. Lungs CTAB. Gastrointestinal: Soft and nontender. No distention. No CVA tenderness. Musculoskeletal: On  examination of the back there is moderate paravertebral muscle tenderness thoracic spine to the left. Range of motion is restricted secondary to pain. No active muscle spasm was seen with patient sitting in wheelchair. Straight leg raises were 90 on the right side and approximately 40 on the left side with discomfort. Neurologic:  Normal speech and language. No gross focal neurologic deficits are appreciated. No gait instability. Reflexes were 1+ bilaterally. Skin:  Skin is warm, dry and intact. No ecchymosis, abrasions or erythema was noted. Psychiatric: Mood and affect are normal. Speech and behavior are normal.  ____________________________________________   LABS (all labs ordered are listed, but only abnormal results are displayed)  Labs Reviewed - No data to display  PROCEDURES  Procedure(s) performed: None  Critical Care performed: No  ____________________________________________   INITIAL IMPRESSION / ASSESSMENT AND PLAN / ED COURSE  Pertinent labs & imaging results that were available during my care of the patient were reviewed by me and considered in my medical decision making (see chart for details).  Patient was given Norco while in the emergency room along with diazepam with improvement. Patient was given a prescription for the above and she is follow-up with her primary care doctor at [facility] if any continued problems. ____________________________________________   FINAL CLINICAL IMPRESSION(S) / ED DIAGNOSES  Final diagnoses:  Chronic back pain greater than 3 months duration  Scoliosis of thoracic spine      Tommi RumpsRhonda L Ayinde Swim, PA-C 05/26/15 1348  Minna AntisKevin Paduchowski, MD 05/26/15 1610

## 2015-05-26 NOTE — ED Notes (Signed)
Pt to ed with c/o back pain.  Worse on left side.  Pt ambulates with limp, states pain worse with movement.

## 2015-05-26 NOTE — ED Notes (Signed)
Lumbar pain with swelling. No difficulty voiding, no incontinence reported. Pt states pain started while bending over picking up a fork.

## 2015-05-26 NOTE — Discharge Instructions (Signed)
Make an appointment with your family doctor at [facility] if any continued problems with your back.

## 2015-06-06 ENCOUNTER — Other Ambulatory Visit: Payer: Self-pay | Admitting: Nurse Practitioner

## 2015-06-13 ENCOUNTER — Emergency Department
Admission: EM | Admit: 2015-06-13 | Discharge: 2015-06-13 | Disposition: A | Discharge status: Home / Self Care | Payer: MEDICAID | Attending: Emergency Medicine | Admitting: Emergency Medicine

## 2015-06-13 DIAGNOSIS — G40909 Epilepsy, unspecified, not intractable, without status epilepticus: Secondary | ICD-10-CM | POA: Insufficient documentation

## 2015-06-13 DIAGNOSIS — F17210 Nicotine dependence, cigarettes, uncomplicated: Secondary | ICD-10-CM | POA: Insufficient documentation

## 2015-06-13 DIAGNOSIS — F10920 Alcohol use, unspecified with intoxication, uncomplicated: Secondary | ICD-10-CM | POA: Insufficient documentation

## 2015-06-13 DIAGNOSIS — F1410 Cocaine abuse, uncomplicated: Secondary | ICD-10-CM | POA: Insufficient documentation

## 2015-06-13 LAB — URINE DRUG SCREEN, QUALITATIVE (ARMC ONLY)
AMPHETAMINES, UR SCREEN: NOT DETECTED
BARBITURATES, UR SCREEN: NOT DETECTED
Benzodiazepine, Ur Scrn: NOT DETECTED
CANNABINOID 50 NG, UR ~~LOC~~: NOT DETECTED
Cocaine Metabolite,Ur ~~LOC~~: POSITIVE — AB
MDMA (Ecstasy)Ur Screen: NOT DETECTED
Methadone Scn, Ur: NOT DETECTED
OPIATE, UR SCREEN: NOT DETECTED
PHENCYCLIDINE (PCP) UR S: NOT DETECTED
Tricyclic, Ur Screen: NOT DETECTED

## 2015-06-13 LAB — CBC WITH DIFFERENTIAL/PLATELET
BASOS ABS: 0 10*3/uL (ref 0–0.1)
Basophils Relative: 1 %
Eosinophils Absolute: 0.1 10*3/uL (ref 0–0.7)
Eosinophils Relative: 1 %
HEMATOCRIT: 40.2 % (ref 35.0–47.0)
HEMOGLOBIN: 13.7 g/dL (ref 12.0–16.0)
LYMPHS PCT: 44 %
Lymphs Abs: 2.8 10*3/uL (ref 1.0–3.6)
MCH: 31.9 pg (ref 26.0–34.0)
MCHC: 34.2 g/dL (ref 32.0–36.0)
MCV: 93.3 fL (ref 80.0–100.0)
Monocytes Absolute: 0.4 10*3/uL (ref 0.2–0.9)
Monocytes Relative: 6 %
NEUTROS ABS: 3.2 10*3/uL (ref 1.4–6.5)
Neutrophils Relative %: 48 %
PLATELETS: 143 10*3/uL — AB (ref 150–440)
RBC: 4.31 MIL/uL (ref 3.80–5.20)
RDW: 12.8 % (ref 11.5–14.5)
WBC: 6.4 10*3/uL (ref 3.6–11.0)

## 2015-06-13 LAB — URINALYSIS COMPLETE WITH MICROSCOPIC (ARMC ONLY)
BACTERIA UA: NONE SEEN
Bilirubin Urine: NEGATIVE
Glucose, UA: NEGATIVE mg/dL
Hgb urine dipstick: NEGATIVE
Leukocytes, UA: NEGATIVE
NITRITE: NEGATIVE
PH: 5 (ref 5.0–8.0)
PROTEIN: NEGATIVE mg/dL
RBC / HPF: NONE SEEN RBC/hpf (ref 0–5)
SPECIFIC GRAVITY, URINE: 1.016 (ref 1.005–1.030)

## 2015-06-13 LAB — COMPREHENSIVE METABOLIC PANEL
ALT: 29 U/L (ref 14–54)
AST: 61 U/L — AB (ref 15–41)
Albumin: 4.4 g/dL (ref 3.5–5.0)
Alkaline Phosphatase: 68 U/L (ref 38–126)
Anion gap: 10 (ref 5–15)
BUN: 12 mg/dL (ref 6–20)
CHLORIDE: 105 mmol/L (ref 101–111)
CO2: 23 mmol/L (ref 22–32)
CREATININE: 0.56 mg/dL (ref 0.44–1.00)
Calcium: 9.4 mg/dL (ref 8.9–10.3)
GFR calc non Af Amer: >60 mL/min (ref >60)
Glucose, Bld: 73 mg/dL (ref 65–99)
POTASSIUM: 3.9 mmol/L (ref 3.5–5.1)
SODIUM: 138 mmol/L (ref 135–145)
Total Bilirubin: 0.7 mg/dL (ref 0.3–1.2)
Total Protein: 7.5 g/dL (ref 6.5–8.1)

## 2015-06-13 LAB — PHENYTOIN LEVEL, TOTAL

## 2015-06-13 LAB — ETHANOL: ALCOHOL ETHYL (B): 70 mg/dL — AB (ref <5)

## 2015-06-13 MED ORDER — LORAZEPAM 2 MG/ML IJ SOLN
1.0000 mg | Freq: Once | INTRAMUSCULAR | Status: AC
  Administered 2015-06-13: 1 mg via INTRAVENOUS

## 2015-06-13 MED ORDER — SODIUM CHLORIDE 0.9 % IV SOLN
20.0000 mg/kg | Freq: Once | INTRAVENOUS | Status: AC
  Administered 2015-06-13: 890 mg via INTRAVENOUS
  Filled 2015-06-13: qty 17.8

## 2015-06-13 MED ORDER — DIVALPROEX SODIUM 500 MG PO DR TAB
500.0000 mg | DELAYED_RELEASE_TABLET | Freq: Once | ORAL | Status: AC
  Administered 2015-06-13: 500 mg via ORAL
  Filled 2015-06-13: qty 1

## 2015-06-13 MED ORDER — LORAZEPAM 2 MG/ML IJ SOLN
INTRAMUSCULAR | Status: AC
  Administered 2015-06-13: 1 mg via INTRAVENOUS
  Filled 2015-06-13: qty 1

## 2015-06-13 MED ORDER — PHENYTOIN SODIUM EXTENDED 100 MG PO CAPS
100.0000 mg | ORAL_CAPSULE | Freq: Three times a day (TID) | ORAL | Status: DC
Start: 2015-06-13 — End: 2015-08-10

## 2015-06-13 NOTE — ED Notes (Signed)
Pt in via triage with complaints of right side rib pain.   Pt reports having a seizure last night and is unsure if she hurt herself or not.  Pt reports she has been off her seizure meds x 2 weeks.  Pt A/Ox4, vitals WDL, no immediate distress at this time.

## 2015-06-13 NOTE — Discharge Instructions (Signed)
You have been seen in the emergency department today for a seizure.  Your workup today including labs are within normal limits.  Please follow up with your doctor/neurologist as soon as possible regarding today's emergency department visit and your likely seizure. ° °As we have discussed it is very important that you do not drive until you have been seen and cleared by your neurologist. ° °Please drink plenty of fluids, get plenty of sleep and avoid any alcohol or drug use °Please return to the emergency department if you have any further seizures which do not respond to medications, or for any other symptoms per se concerning for yourself. ° ° °Epilepsy °Epilepsy is a disorder in which a person has repeated seizures over time. A seizure is a release of abnormal electrical activity in the brain. Seizures can cause a change in attention, behavior, or the ability to remain awake and alert (altered mental status). Seizures often involve uncontrollable shaking (convulsions).  °Most people with epilepsy lead normal lives. However, people with epilepsy are at an increased risk of falls, accidents, and injuries. Therefore, it is important to begin treatment right away. °CAUSES  °Epilepsy has many possible causes. Anything that disturbs the normal pattern of brain cell activity can lead to seizures. This may include:  °· Head injury. °· Birth trauma. °· High fever as a child. °· Stroke. °· Bleeding into or around the brain. °· Certain drugs. °· Prolonged low oxygen, such as what occurs after CPR efforts. °· Abnormal brain development. °· Certain illnesses, such as meningitis, encephalitis (brain infection), malaria, and other infections. °· An imbalance of nerve signaling chemicals (neurotransmitters).   °SIGNS AND SYMPTOMS  °The symptoms of a seizure can vary greatly from one person to another. Right before a seizure, you may have a warning (aura) that a seizure is about to occur. An aura may include the following  symptoms: °· Fear or anxiety. °· Nausea. °· Feeling like the room is spinning (vertigo). °· Vision changes, such as seeing flashing lights or spots. °Common symptoms during a seizure include: °· Abnormal sensations, such as an abnormal smell or a bitter taste in the mouth.   °· Sudden, general body stiffness.   °· Convulsions that involve rhythmic jerking of the face, arm, or leg on one or both sides.   °· Sudden change in consciousness.   °¨ Appearing to be awake but not responding.   °¨ Appearing to be asleep but cannot be awakened.   °· Grimacing, chewing, lip smacking, drooling, tongue biting, or loss of bowel or bladder control. °After a seizure, you may feel sleepy for a while.  °DIAGNOSIS  °Your health care provider will ask about your symptoms and take a medical history. Descriptions from any witnesses to your seizures will be very helpful in the diagnosis. A physical exam, including a detailed neurological exam, is necessary. Various tests may be done, such as:  °· An electroencephalogram (EEG). This is a painless test of your brain waves. In this test, a diagram is created of your brain waves. These diagrams can be interpreted by a specialist. °· An MRI of the brain.   °· A CT scan of the brain.   °· A spinal tap (lumbar puncture, LP). °· Blood tests to check for signs of infection or abnormal blood chemistry. °TREATMENT  °There is no cure for epilepsy, but it is generally treatable. Once epilepsy is diagnosed, it is important to begin treatment as soon as possible. For most people with epilepsy, seizures can be controlled with medicines. The following may also   be used: °· A pacemaker for the brain (vagus nerve stimulator) can be used for people with seizures that are not well controlled by medicine. °· Surgery on the brain. °For some people, epilepsy eventually goes away. °HOME CARE INSTRUCTIONS  °· Follow your health care provider's recommendations on driving and safety in normal activities. °· Get  enough rest. Lack of sleep can cause seizures. °· Only take over-the-counter or prescription medicines as directed by your health care provider. Take any prescribed medicine exactly as directed. °· Avoid any known triggers of your seizures. °· Keep a seizure diary. Record what you recall about any seizure, especially any possible trigger.   °· Make sure the people you live and work with know that you are prone to seizures. They should receive instructions on how to help you. In general, a witness to a seizure should:   °¨ Cushion your head and body.   °¨ Turn you on your side.   °¨ Avoid unnecessarily restraining you.   °¨ Not place anything inside your mouth.   °¨ Call for emergency medical help if there is any question about what has occurred.   °· Follow up with your health care provider as directed. You may need regular blood tests to monitor the levels of your medicine.   °SEEK MEDICAL CARE IF:  °· You develop signs of infection or other illness. This might increase the risk of a seizure.   °· You seem to be having more frequent seizures.   °· Your seizure pattern is changing.   °SEEK IMMEDIATE MEDICAL CARE IF:  °· You have a seizure that does not stop after a few moments.   °· You have a seizure that causes any difficulty in breathing.   °· You have a seizure that results in a very severe headache.   °· You have a seizure that leaves you with the inability to speak or use a part of your body.   °  °This information is not intended to replace advice given to you by your health care provider. Make sure you discuss any questions you have with your health care provider. °  °Document Released: 01/21/2005 Document Revised: 11/11/2012 Document Reviewed: 09/02/2012 °Elsevier Interactive Patient Education ©2016 [employer] ° °

## 2015-06-13 NOTE — ED Provider Notes (Signed)
[facility] Emergency Department Provider Note  ____________________________________________  Time seen: Approximately 8:54 PM  I have reviewed the triage vital signs and the nursing notes.   HISTORY  Chief Complaint Seizures  Mild intoxication with alcohol and positive for cocaine limits reliability of history.  HPI [name] is a 50 y.o. female with a long history of reported seizure disorder and non-compliance with her medication regimen, as well as ETOH and cocaine use.  She presents tonight complaining of rib and lower back pain after reportedly taking several seizures over the last two days.  She reports that she has not been taking her Dilantin in about 2 weeks, because she no long has any, and she states that if she takes her depakote without her Dilantin she actually has more seizures.  She has presented multiple times in the past with similar complaints including the MSK pain after seizures.  She currently complains of a moderate headache and generalized muscle aches.  She denies (non-rib pain) chest pain, N/V/D, abdominal pain, dysuria.  She denies any recent alcohol consumption and denies any substance abuse.  She reports her symptoms are moderate and nothing makes them better nor worse.   Past Medical History  Diagnosis Date  . Allergy   . Seizures (HCC)   . Seizure [facility])     Patient Active Problem List   Diagnosis Date Noted  . Eczema 05/16/2015  . 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 Seizure disorder (HCC) 08/21/2014    No past surgical history on file.  Current Outpatient Rx  Name  Route  Sig  Dispense  Refill  . diazepam (VALIUM) 2 MG tablet   Oral   Take 1 tablet (2 mg total) by mouth every 8 (eight) hours as needed for muscle spasms.   9 tablet   0   . divalproex (DEPAKOTE) 500 MG DR tablet   Oral   Take 1 tablet (500 mg total) by mouth 3 (three) times daily.   270 tablet   3   . haloperidol (HALDOL) 2 MG  tablet   Oral   Take 1 mg by mouth at bedtime.         Marland Kitchen HYDROcodone-acetaminophen (NORCO/VICODIN) 5-325 MG tablet   Oral   Take 1 tablet by mouth every 4 (four) hours as needed for moderate pain.   12 tablet   0   . phenytoin (DILANTIN) 100 MG ER capsule   Oral   Take 1 capsule (100 mg total) by mouth 3 (three) times daily.   90 capsule   0   . traZODone (DESYREL) 100 MG tablet   Oral   Take 100 mg by mouth at bedtime.         . triamcinolone cream (KENALOG) 0.1 %   Topical   Apply 1 application topically 2 (two) times daily.   30 g   0     Allergies Flagyl  No family history on file.  Social History Social History  Substance Use Topics  . Smoking status: Current Every Day Smoker -- 0.50 packs/day    Types: Cigarettes  . Smokeless tobacco: Not on file  . Alcohol Use: Yes    Review of Systems Constitutional: No fever/chills Eyes: No visual changes. ENT: No sore throat. Cardiovascular: Denies chest pain.  Right sided chest wall pain. Respiratory: Denies shortness of breath. Gastrointestinal: No abdominal pain.  No nausea, no vomiting.  No diarrhea.  No constipation. Genitourinary: Negative for dysuria. Musculoskeletal: Low back  pain Skin: Negative for rash. Neurological: Reports headache and recent seizures.  10-point ROS otherwise negative.  ____________________________________________   PHYSICAL EXAM:  VITAL SIGNS: ED Triage Vitals  Enc Vitals Group     BP 06/13/15 1932 129/88 mmHg     Pulse Rate 06/13/15 1932 83     Resp 06/13/15 1932 18     Temp 06/13/15 1932 98.5 F (36.9 C)     Temp Source 06/13/15 1932 Oral     SpO2 06/13/15 1932 96 %     Weight 06/13/15 1932 98 lb (44.453 kg)     Height 06/13/15 1932 5' (1.524 m)     Head Cir --      Peak Flow --      Pain Score 06/13/15 1932 7     Pain Loc --      Pain Edu? --      Excl. in GC? --     Constitutional: Alert and oriented. No acute distress. Eyes: Conjunctivae are normal.  PERRL. EOMI.  Strabismus. Head: Atraumatic. Nose: No congestion/rhinnorhea. Mouth/Throat: Mucous membranes are moist.  Oropharynx non-erythematous.  Essentially edentulous. Neck: No stridor.  No meningeal signs.  No cervical spine tenderness to palpation. Cardiovascular: Normal rate, regular rhythm. Good peripheral circulation. Grossly normal heart sounds.  Right sided check wall tenderness with no obvious contusion nor deformity Respiratory: Normal respiratory effort.  No retractions. Lungs CTAB. Gastrointestinal: Soft and nontender. No distention.  Musculoskeletal: No lower extremity tenderness nor edema. No gross deformities of extremities. Soft tissue tenderness of lower back.  Right sided chest wall tenderness Neurologic:  Normal speech and language. No gross focal neurologic deficits are appreciated.  Skin:  Skin is warm, dry and intact. No rash noted.   ____________________________________________   LABS (all labs ordered are listed, but only abnormal results are displayed)  Labs Reviewed  CBC WITH DIFFERENTIAL/PLATELET - Abnormal; Notable for the following:    Platelets 143 (*)    All other components within normal limits  COMPREHENSIVE METABOLIC PANEL - Abnormal; Notable for the following:    AST 61 (*)    All other components within normal limits  URINALYSIS COMPLETEWITH MICROSCOPIC ([facility] ONLY) - Abnormal; Notable for the following:    Color, Urine YELLOW (*)    APPearance CLEAR (*)    Ketones, ur TRACE (*)    Squamous Epithelial / LPF 0-5 (*)    All other components within normal limits  PHENYTOIN LEVEL, TOTAL - Abnormal; Notable for the following:    Phenytoin Lvl <2.5 (*)    All other components within normal limits  URINE DRUG SCREEN, QUALITATIVE ([facility] ONLY) - Abnormal; Notable for the following:    Cocaine Metabolite,Ur [place] POSITIVE (*)    All other components within normal limits  ETHANOL - Abnormal; Notable for the following:    Alcohol, Ethyl (B) 70 (*)    All  other components within normal limits   ____________________________________________  EKG  None ____________________________________________  RADIOLOGY   No results found.  ____________________________________________   PROCEDURES  Procedure(s) performed: None  Critical Care performed: No ____________________________________________   INITIAL IMPRESSION / ASSESSMENT AND PLAN / ED COURSE  Pertinent labs & imaging results that were available during my care of the patient were reviewed by me and considered in my medical decision making (see chart for details).  The patient was placed in a room and was awaiting my evaluation for awhile due to other urgent patient care duties I was performing.  She let the nurse know  that she thought she was about to have a seizure and probably will have one if she does not get some medication.  Reviewing her history, I saw that she is supposed to be on Dilantin, so I ordered a loading dose of 20 mg/kg IV and called the pharmacy so they could expedite the preparation.  Shortly thereafter, and before the Dilantin could be given, I was told by the nurse that the patient was having a seizure.  When I entered the room, she was engaged in some lip-smacking behavior and shaking slightly and appeared to be holding her breath vs not breathing.  I performed a brisk sternal rub which caused her to open her eyes, start breathing again, withdraw from the painful stimulus, stop the shaking movements, and turn over on her side away from me.  This episodes does not appear consistent with epileptic seizure activity.  The nurses continued to try and obtain IV access.  Soon thereafter, I was called back to the room, and she was exhibiting similar behavior again.  This time I performed a painful stimulus to her right great toe and she again immediately responded, make a statement that I could not make out, and ceased her behavior though she again started to smack her lips.   Again, knowing her history including the frequent alcohol use, I ordered Ativan 1 mg IV.    We administered the Dilantin bolus and depakote PO.  Soon thereafter the patient was asking for ginger ale, which she drank, and chocolate milk.  In the meantime, her labs came back and were reassuring, but also positive for ETOH and cocaine.  The patient is comfortable with the plan for discharge. I gave her a prescription for Dilantin as per her last recorded dose and encouraged her to take all her medications and avoid drug and alcohol use.  I declined to write her a prescription for narcotic pain medication for her MSK pain/strain and encouraged her to take over-the-counter medication.  I gave my usual and customary return precautions.   ____________________________________________  FINAL CLINICAL IMPRESSION(S) / ED DIAGNOSES  Final diagnoses:  Seizure disorder (HCC)  Cocaine abuse  Alcohol intoxication, uncomplicated (HCC)     MEDICATIONS GIVEN DURING THIS VISIT:  Medications  phenytoin (DILANTIN) 890 mg in sodium chloride 0.9 % 250 mL IVPB (0 mg/kg  44.5 kg Intravenous Stopped 06/13/15 2222)  divalproex (DEPAKOTE) DR tablet 500 mg (500 mg Oral Given 06/13/15 2151)  LORazepam (ATIVAN) injection 1 mg (1 mg Intravenous Given 06/13/15 2140)     NEW OUTPATIENT MEDICATIONS STARTED DURING THIS VISIT:  Discharge Medication List as of 06/13/2015 11:23 PM        Note:  This document was prepared using Dragon voice recognition software and may include unintentional dictation errors.   Loleta Rose, MD 06/14/15 [phone]

## 2015-06-13 NOTE — ED Notes (Signed)
Pt here for back pain states had seizure last night hx of seizure has been out of meds of 2 weeks pt here for back pain due to fall from seizure but also here for dilantin.

## 2015-06-13 NOTE — ED Notes (Signed)
Pt now awake, A/Ox4, asking for a ginger-ale.  MD notified.

## 2015-06-13 NOTE — ED Notes (Signed)

## 2015-06-15 ENCOUNTER — Telehealth: Payer: Self-pay

## 2015-06-15 ENCOUNTER — Other Ambulatory Visit: Payer: Self-pay

## 2015-06-15 DIAGNOSIS — L309 Dermatitis, unspecified: Secondary | ICD-10-CM

## 2015-06-15 MED ORDER — TRIAMCINOLONE ACETONIDE 0.1 % EX CREA: 1.0000 "application " | TOPICAL_CREAM | Freq: Two times a day (BID) | CUTANEOUS | Status: DC

## 2015-06-28 ENCOUNTER — Other Ambulatory Visit: Payer: Self-pay

## 2015-07-06 ENCOUNTER — Ambulatory Visit: Payer: Self-pay

## 2015-07-19 ENCOUNTER — Other Ambulatory Visit: Payer: Self-pay

## 2015-07-19 DIAGNOSIS — G40909 Epilepsy, unspecified, not intractable, without status epilepticus: Secondary | ICD-10-CM

## 2015-07-20 LAB — PHENYTOIN LEVEL, TOTAL: PHENYTOIN (DILANTIN), SERUM: 0.9 ug/mL — AB (ref 10.0–20.0)

## 2015-07-26 ENCOUNTER — Ambulatory Visit: Payer: Self-pay | Admitting: Internal Medicine

## 2015-07-27 ENCOUNTER — Ambulatory Visit: Payer: Self-pay

## 2015-07-27 NOTE — Telephone Encounter (Signed)
Needed refill

## 2015-08-10 ENCOUNTER — Telehealth: Payer: Self-pay

## 2015-08-10 DIAGNOSIS — R569 Unspecified convulsions: Secondary | ICD-10-CM

## 2015-08-10 MED ORDER — PHENYTOIN SODIUM EXTENDED 100 MG PO CAPS: 100.0000 mg | ORAL_CAPSULE | Freq: Three times a day (TID) | ORAL | Status: DC

## 2015-08-16 ENCOUNTER — Ambulatory Visit: Payer: Self-pay | Admitting: Internal Medicine

## 2015-08-22 ENCOUNTER — Other Ambulatory Visit: Payer: Self-pay | Admitting: Nurse Practitioner

## 2015-08-22 ENCOUNTER — Emergency Department
Admission: EM | Admit: 2015-08-22 | Discharge: 2015-08-22 | Disposition: A | Discharge status: Home / Self Care | Payer: MEDICAID | Attending: Emergency Medicine | Admitting: Emergency Medicine

## 2015-08-22 ENCOUNTER — Emergency Department: Payer: MEDICAID

## 2015-08-22 ENCOUNTER — Encounter: Payer: Self-pay | Admitting: Emergency Medicine

## 2015-08-22 DIAGNOSIS — Z79899 Other long term (current) drug therapy: Secondary | ICD-10-CM | POA: Insufficient documentation

## 2015-08-22 DIAGNOSIS — F10120 Alcohol abuse with intoxication, uncomplicated: Secondary | ICD-10-CM | POA: Insufficient documentation

## 2015-08-22 DIAGNOSIS — F10920 Alcohol use, unspecified with intoxication, uncomplicated: Secondary | ICD-10-CM

## 2015-08-22 DIAGNOSIS — G40909 Epilepsy, unspecified, not intractable, without status epilepticus: Secondary | ICD-10-CM | POA: Insufficient documentation

## 2015-08-22 DIAGNOSIS — F17210 Nicotine dependence, cigarettes, uncomplicated: Secondary | ICD-10-CM | POA: Insufficient documentation

## 2015-08-22 DIAGNOSIS — Z85118 Personal history of other malignant neoplasm of bronchus and lung: Secondary | ICD-10-CM | POA: Insufficient documentation

## 2015-08-22 DIAGNOSIS — R569 Unspecified convulsions: Secondary | ICD-10-CM

## 2015-08-22 DIAGNOSIS — F1490 Cocaine use, unspecified, uncomplicated: Secondary | ICD-10-CM

## 2015-08-22 LAB — URINALYSIS COMPLETE WITH MICROSCOPIC (ARMC ONLY)
Bacteria, UA: NONE SEEN
Bilirubin Urine: NEGATIVE
Glucose, UA: NEGATIVE mg/dL
Hgb urine dipstick: NEGATIVE
Ketones, ur: NEGATIVE mg/dL
Leukocytes, UA: NEGATIVE
Nitrite: NEGATIVE
PH: 5 (ref 5.0–8.0)
PROTEIN: NEGATIVE mg/dL
RBC / HPF: NONE SEEN RBC/hpf (ref 0–5)
Specific Gravity, Urine: 1.003 — ABNORMAL LOW (ref 1.005–1.030)

## 2015-08-22 LAB — URINE DRUG SCREEN, QUALITATIVE (ARMC ONLY)
Amphetamines, Ur Screen: NOT DETECTED
BENZODIAZEPINE, UR SCRN: NOT DETECTED
Barbiturates, Ur Screen: NOT DETECTED
Cannabinoid 50 Ng, Ur ~~LOC~~: NOT DETECTED
Cocaine Metabolite,Ur ~~LOC~~: POSITIVE — AB
MDMA (Ecstasy)Ur Screen: NOT DETECTED
METHADONE SCREEN, URINE: NOT DETECTED
Opiate, Ur Screen: NOT DETECTED
Phencyclidine (PCP) Ur S: NOT DETECTED
TRICYCLIC, UR SCREEN: NOT DETECTED

## 2015-08-22 LAB — ETHANOL: ALCOHOL ETHYL (B): 219 mg/dL — AB (ref <5)

## 2015-08-22 LAB — CBC
HCT: 39.5 % (ref 35.0–47.0)
HEMOGLOBIN: 14.2 g/dL (ref 12.0–16.0)
MCH: 33.7 pg (ref 26.0–34.0)
MCHC: 35.9 g/dL (ref 32.0–36.0)
MCV: 93.8 fL (ref 80.0–100.0)
PLATELETS: 151 10*3/uL (ref 150–440)
RBC: 4.21 MIL/uL (ref 3.80–5.20)
RDW: 13.4 % (ref 11.5–14.5)
WBC: 6.3 10*3/uL (ref 3.6–11.0)

## 2015-08-22 LAB — GLUCOSE, CAPILLARY: GLUCOSE-CAPILLARY: 95 mg/dL (ref 65–99)

## 2015-08-22 LAB — COMPREHENSIVE METABOLIC PANEL
ALT: 27 U/L (ref 14–54)
AST: 45 U/L — AB (ref 15–41)
Albumin: 4.6 g/dL (ref 3.5–5.0)
Alkaline Phosphatase: 73 U/L (ref 38–126)
Anion gap: 9 (ref 5–15)
BUN: 12 mg/dL (ref 6–20)
CHLORIDE: 109 mmol/L (ref 101–111)
CO2: 24 mmol/L (ref 22–32)
CREATININE: 0.5 mg/dL (ref 0.44–1.00)
Calcium: 9.4 mg/dL (ref 8.9–10.3)
GFR calc Af Amer: >60 mL/min (ref >60)
Glucose, Bld: 86 mg/dL (ref 65–99)
Potassium: 3.7 mmol/L (ref 3.5–5.1)
Sodium: 142 mmol/L (ref 135–145)
Total Bilirubin: 0.7 mg/dL (ref 0.3–1.2)
Total Protein: 7.9 g/dL (ref 6.5–8.1)

## 2015-08-22 LAB — TROPONIN I

## 2015-08-22 MED ORDER — MIDAZOLAM HCL 2 MG/2ML IJ SOLN
2.0000 mg | Freq: Once | INTRAMUSCULAR | Status: AC
  Administered 2015-08-22: 2 mg via INTRAVENOUS
  Filled 2015-08-22: qty 2

## 2015-08-22 NOTE — Discharge Instructions (Signed)
Please follow up with your primary care physician.  Do not use cocaine, and only drink alcohol in moderation.  Continue your seizure medications as prescribed.  Return to the emergency department for severe pain, seizures, changes in mental status, or any other symptoms concerning to you.   Alcohol Intoxication Alcohol intoxication occurs when the amount of alcohol that a person has consumed impairs his or her ability to mentally and physically function. Alcohol directly impairs the normal chemical activity of the brain. Drinking large amounts of alcohol can lead to changes in mental function and behavior, and it can cause many physical effects that can be harmful.  Alcohol intoxication can range in severity from mild to very severe. Various factors can affect the level of intoxication that occurs, such as the person's age, gender, weight, frequency of alcohol consumption, and the presence of other medical conditions (such as diabetes, seizures, or heart conditions). Dangerous levels of alcohol intoxication may occur when people drink large amounts of alcohol in a short period (binge drinking). Alcohol can also be especially dangerous when combined with certain prescription medicines or "recreational" drugs. SIGNS AND SYMPTOMS Some common signs and symptoms of mild alcohol intoxication include:  Loss of coordination.  Changes in mood and behavior.  Impaired judgment.  Slurred speech. As alcohol intoxication progresses to more severe levels, other signs and symptoms will appear. These may include:  Vomiting.  Confusion and impaired memory.  Slowed breathing.  Seizures.  Loss of consciousness. DIAGNOSIS  Your health care provider will take a medical history and perform a physical exam. You will be asked about the amount and type of alcohol you have consumed. Blood tests will be done to measure the concentration of alcohol in your blood. In many places, your blood alcohol level must be  lower than 80 mg/dL (7.82%) to legally drive. However, many dangerous effects of alcohol can occur at much lower levels.  TREATMENT  People with alcohol intoxication often do not require treatment. Most of the effects of alcohol intoxication are temporary, and they go away as the alcohol naturally leaves the body. Your health care provider will monitor your condition until you are stable enough to go home. Fluids are sometimes given through an IV access tube to help prevent dehydration.  HOME CARE INSTRUCTIONS  Do not drive after drinking alcohol.  Stay hydrated. Drink enough water and fluids to keep your urine clear or pale yellow. Avoid caffeine.   Only take over-the-counter or prescription medicines as directed by your health care provider.  SEEK MEDICAL CARE IF:   You have persistent vomiting.   You do not feel better after a few days.  You have frequent alcohol intoxication. Your health care provider can help determine if you should see a substance use treatment counselor. SEEK IMMEDIATE MEDICAL CARE IF:   You become shaky or tremble when you try to stop drinking.   You shake uncontrollably (seizure).   You throw up (vomit) blood. This may be bright red or may look like black coffee grounds.   You have blood in your stool. This may be bright red or may appear as a black, tarry, bad smelling stool.   You become lightheaded or faint.  MAKE SURE YOU:   Understand these instructions.  Will watch your condition.  Will get help right away if you are not doing well or get worse.   This information is not intended to replace advice given to you by your health care provider. Make sure you discuss  any questions you have with your health care provider.   Document Released: 10/31/2004 Document Revised: 09/23/2012 Document Reviewed: 06/26/2012 Elsevier Interactive Patient Education [employer]2016 Elsevier Inc.

## 2015-08-22 NOTE — ED Provider Notes (Signed)
[facility] Emergency Department Provider Note   ____________________________________________  Time seen: Approximately 1:39 AM  I have reviewed the triage vital signs and the nursing notes.   HISTORY  Chief Complaint Seizure  History Limited as patient mildly postictal  HPI [name] is a 50 y.o. female who comes into the hospital today with seizure activity. The patient had 4 seizures tonight and one yesterday. The patient has a history of lung cancer and possible brain tumor. The patient does have a history of seizures and per EMS they did have one episode where she flopped around prior to her coming in that they're concerned may have been a seizure. The patient reports that she does have some head pain that started yesterday. She reports that she takes Dilantin and Depakote but has been out she reports of Depakote 4 weeks. The patient does not know how long her seizures lasted and is unable to give me more information about the activity that occurred tonight. The patient did tell the nurse that she drank and took a rock of something. The patient is having some halting and stuttering speech. Initially she was not speaking per EMS she was mildly unresponsive and then she woke up but is having some word finding difficulties.   Past Medical History  Diagnosis Date  . Allergy   . Seizures (HCC)   . Seizure [facility])     Patient Active Problem List   Diagnosis Date Noted  . Eczema 05/16/2015  . 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 Seizure disorder (HCC) 08/21/2014    History reviewed. No pertinent past surgical history.  Current Outpatient Rx  Name  Route  Sig  Dispense  Refill  . divalproex (DEPAKOTE) 500 MG DR tablet   Oral   Take 1 tablet (500 mg total) by mouth 3 (three) times daily.   270 tablet   3   . haloperidol (HALDOL) 2 MG tablet   Oral   Take 1 mg by mouth at bedtime. Reported on 07/27/2015         . phenytoin (DILANTIN)  100 MG ER capsule   Oral   Take 1 capsule (100 mg total) by mouth 3 (three) times daily.   90 capsule   1   . traZODone (DESYREL) 100 MG tablet   Oral   Take 100 mg by mouth at bedtime. Reported on 07/27/2015         . triamcinolone cream (KENALOG) 0.1 %   Topical   Apply 1 application topically 2 (two) times daily.   30 g   0   . diazepam (VALIUM) 2 MG tablet   Oral   Take 1 tablet (2 mg total) by mouth every 8 (eight) hours as needed for muscle spasms. Patient not taking: Reported on 08/22/2015   9 tablet   0   . HYDROcodone-acetaminophen (NORCO/VICODIN) 5-325 MG tablet   Oral   Take 1 tablet by mouth every 4 (four) hours as needed for moderate pain. Patient not taking: Reported on 08/22/2015   12 tablet   0     Allergies Flagyl  History reviewed. No pertinent family history.  Social History Social History  Substance Use Topics  . Smoking status: Current Every Day Smoker -- 0.50 packs/day for 35 years    Types: Cigarettes  . Smokeless tobacco: None  . Alcohol Use: 0.0 oz/week    0 Standard drinks or equivalent per week    Review of Systems Constitutional: No fever/chills  Eyes: No visual changes. ENT: No sore throat. Cardiovascular: Denies chest pain. Respiratory: Denies shortness of breath. Gastrointestinal: No abdominal pain.  No nausea, no vomiting.  No diarrhea.  No constipation. Genitourinary: Negative for dysuria. Musculoskeletal: Negative for back pain. Skin: Negative for rash. Neurological: Headache and seizure  10-point ROS otherwise negative.  ____________________________________________   PHYSICAL EXAM:  VITAL SIGNS: ED Triage Vitals  Enc Vitals Group     BP 08/22/15  0158 117/77     Pulse 08/22/15  0158 99     Resp 08/22/15  0158 12     Temp 08/22/15  0158 99     Temp src --      SpO2 08/22/15  0158 97%     Weight --      Height --      Head Cir --      Peak Flow --      Pain Score --      Pain Loc --      Pain Edu? --       Excl. in GC? --     Constitutional: Alert and oriented. Well appearing and in no acute distress. Eyes: Conjunctivae are normal. PERRL. EOMI. Head: Atraumatic. Nose: No congestion/rhinnorhea. Mouth/Throat: Mucous membranes are moist.  Oropharynx non-erythematous. Cardiovascular: Normal rate, regular rhythm. Grossly normal heart sounds.  Good peripheral circulation. Respiratory: Normal respiratory effort.  No retractions. Lungs CTAB. Gastrointestinal: Soft and nontender. Positive bowel sounds Musculoskeletal: No lower extremity tenderness nor edema.   Neurologic:  Normal speech and language. No gross focal neurologic deficits are appreciated. Cranial nerves II through XII grossly intact aside from patient reports that she cannot feel when I touch her entire face. Patient reports that she is unable to squeeze my hands bilaterally but has good flexion and extension strength. Skin:  Skin is warm, dry and intact. No rash noted. Psychiatric: Mood and affect are normal.   ____________________________________________   LABS (all labs ordered are listed, but only abnormal results are displayed)  Labs Reviewed  COMPREHENSIVE METABOLIC PANEL - Abnormal; Notable for the following:    AST 45 (*)    All other components within normal limits  URINALYSIS COMPLETEWITH MICROSCOPIC ([facility] ONLY) - Abnormal; Notable for the following:    Color, Urine COLORLESS (*)    APPearance CLEAR (*)    Specific Gravity, Urine 1.003 (*)    Squamous Epithelial / LPF 0-5 (*)    All other components within normal limits  ETHANOL - Abnormal; Notable for the following:    Alcohol, Ethyl (B) 219 (*)    All other components within normal limits  URINE DRUG SCREEN, QUALITATIVE ([facility] ONLY) - Abnormal; Notable for the following:    Cocaine Metabolite,Ur [place] POSITIVE (*)    All other components within normal limits  CBC  TROPONIN I  GLUCOSE, CAPILLARY   ____________________________________________  EKG  ED ECG  REPORT I, Rebecka Apley, the attending physician, personally viewed and interpreted this ECG.   Date: 08/22/2015  EKG Time: 155  Rate: 95  Rhythm: normal sinus rhythm  Axis: Normal  Intervals:none  ST&T Change: normal  ____________________________________________  RADIOLOGY  CT head: No acute finding or change from 2016 ____________________________________________   PROCEDURES  Procedure(s) performed: None  Procedures  Critical Care performed: No  ____________________________________________   INITIAL IMPRESSION / ASSESSMENT AND PLAN / ED COURSE  Pertinent labs & imaging results that were available during my care of the patient were reviewed by me and considered in my  medical decision making (see chart for details).  This is a 50 year old female who comes into the hospital today with some seizure. The patient does have a history of seizures but has not been taking her medications. The patient also reported that she had some beer and took a rock of some drug. We will check some blood work and reassess the patient. She'll receive a dose of Versed 2 mg IV 1.  The patient did not have any seizure activity in the hospital. Her care will be signed out to Dr. Sharma Covert who will follow up the patient. At this time I feel that the patient's seizure activity was due to her substance abuse. The patient will be reassessed.  ____________________________________________   FINAL CLINICAL IMPRESSION(S) / ED DIAGNOSES  Final diagnoses:  Cocaine use  Alcohol intoxication, uncomplicated (HCC)  Seizure (HCC)      NEW MEDICATIONS STARTED DURING THIS VISIT:  New Prescriptions   No medications on file     Note:  This document was prepared using Dragon voice recognition software and may include unintentional dictation errors.    Rebecka Apley, MD 08/22/15 [phone]

## 2015-08-22 NOTE — ED Notes (Addendum)
Pt arrived via [facility] for seizures x4 prior to arrival. Airway intact at arrival. Pt admits ingesting ETOH (beers) and Roc (cocaine) tonight, appears under influence. Per EMS BP-120/80, HR-100, 100% on room air. Pt reports she is been out of her seizure meds dilantin and Depakote x2 weeks.

## 2015-08-30 ENCOUNTER — Ambulatory Visit: Payer: Self-pay | Admitting: Internal Medicine

## 2015-08-30 DIAGNOSIS — G40909 Epilepsy, unspecified, not intractable, without status epilepticus: Secondary | ICD-10-CM

## 2015-08-30 MED ORDER — DIVALPROEX SODIUM 500 MG PO DR TAB: 500.0000 mg | DELAYED_RELEASE_TABLET | Freq: Three times a day (TID) | ORAL | 2 refills | Status: DC

## 2015-08-30 NOTE — Progress Notes (Signed)
   Subjective:    Patient ID: [name], female    DOB: [birth year], 50 y.o.   MRN: [id number]  HPI Patient Active Problem List   Diagnosis Date Noted  . Eczema 05/16/2015  . 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 Seizure disorder (HCC) 08/21/2014    Patient following up to ED discharge.  Patient has left hip pain for a week.    Review of Systems Normal     Objective:   Physical Exam  Constitutional: She is oriented to person, place, and time.  Cardiovascular: Normal rate, regular rhythm and normal heart sounds.   Pulmonary/Chest: Effort normal and breath sounds normal.  Neurological: She is alert and oriented to person, place, and time.     Medication List       Accurate as of 08/30/15  9:50 AM. Always use your most recent med list.          diazepam 2 MG tablet Commonly known as:  VALIUM Take 1 tablet (2 mg total) by mouth every 8 (eight) hours as needed for muscle spasms.   divalproex 500 MG DR tablet Commonly known as:  DEPAKOTE Take 1 tablet (500 mg total) by mouth 3 (three) times daily.   haloperidol 2 MG tablet Commonly known as:  HALDOL Take 1 mg by mouth at bedtime. Reported on 07/27/2015   HYDROcodone-acetaminophen 5-325 MG tablet Commonly known as:  NORCO/VICODIN Take 1 tablet by mouth every 4 (four) hours as needed for moderate pain.   phenytoin 100 MG ER capsule Commonly known as:  DILANTIN Take 1 capsule (100 mg total) by mouth 3 (three) times daily.   traZODone 100 MG tablet Commonly known as:  DESYREL Take 100 mg by mouth at bedtime. Reported on 07/27/2015   triamcinolone cream 0.1 % Commonly known as:  KENALOG Apply 1 application topically 2 (two) times daily.      BP 108/68   Pulse 97   Temp 98.2 F (36.8 C)   Wt 99 lb (44.9 kg)   LMP  (LMP Unknown)   BMI 19.33 kg/m         Assessment & Plan:  Patient to follow up in 3 months with labs prior to appt MetC, CBC, Dilantin, Depakote, UA, Microalbumin

## 2015-08-30 NOTE — Patient Instructions (Signed)
Labs prior to appt: MetC, CBC, Dilantin, Depakote, UA, Microalbumin

## 2015-08-31 NOTE — Addendum Note (Signed)
Addended by: Bo Mcclintock on: 08/31/2015 05:19 PM   Modules accepted: Orders

## 2015-09-11 NOTE — Telephone Encounter (Signed)
Refill

## 2015-10-12 ENCOUNTER — Telehealth: Payer: Self-pay

## 2015-10-12 NOTE — Telephone Encounter (Signed)
Called pt to make appt. Appt made.  

## 2015-10-12 NOTE — Telephone Encounter (Signed)
Called pt to make appt. Phone turned off.

## 2015-10-17 ENCOUNTER — Other Ambulatory Visit: Payer: Self-pay | Admitting: Urology

## 2015-10-17 ENCOUNTER — Other Ambulatory Visit: Payer: Self-pay | Admitting: Internal Medicine

## 2015-10-17 DIAGNOSIS — R569 Unspecified convulsions: Secondary | ICD-10-CM

## 2015-10-18 ENCOUNTER — Ambulatory Visit: Payer: Self-pay | Admitting: Internal Medicine

## 2015-10-18 ENCOUNTER — Encounter: Payer: Self-pay | Admitting: Internal Medicine

## 2015-10-18 VITALS — BP 130/76 | HR 95 | Wt 83.0 lb

## 2015-10-18 DIAGNOSIS — M06331 Rheumatoid nodule, right wrist: Secondary | ICD-10-CM

## 2015-10-18 DIAGNOSIS — G43809 Other migraine, not intractable, without status migrainosus: Secondary | ICD-10-CM

## 2015-10-18 NOTE — Patient Instructions (Signed)
Labs today: sedimentation rate, B12 F/u with current apt in Nov.  Referral to xray of skull for pain and injury at the R front Referral to xray of R wrist for recent nodule

## 2015-10-18 NOTE — Progress Notes (Signed)
   Subjective:    Patient ID: [name], female    DOB: 03/28/1965, 50 y.o.   MRN: [id number]  HPI   Pt f/u for migraines. Pain is constant. Ibuprofen slightly decreases pain. Pain localized R anterior. Reports sometimes feels dizzy as if she might get another seizure. Previously fell and hit her head near where pain is localized.   Patient Active Problem List   Diagnosis Date Noted  . Eczema 05/16/2015  . 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 Seizure disorder (HCC) 08/21/2014     Medication List       Accurate as of 10/18/15 11:16 AM. Always use your most recent med list.          diazepam 2 MG tablet Commonly known as:  VALIUM Take 1 tablet (2 mg total) by mouth every 8 (eight) hours as needed for muscle spasms.   divalproex 500 MG DR tablet Commonly known as:  DEPAKOTE Take 1 tablet (500 mg total) by mouth 3 (three) times daily.   haloperidol 2 MG tablet Commonly known as:  HALDOL Take 1 mg by mouth at bedtime. Reported on 07/27/2015   HYDROcodone-acetaminophen 5-325 MG tablet Commonly known as:  NORCO/VICODIN Take 1 tablet by mouth every 4 (four) hours as needed for moderate pain.   phenytoin 100 MG ER capsule Commonly known as:  DILANTIN Take 1 capsule by mouth 3 times daily.   traZODone 100 MG tablet Commonly known as:  DESYREL Take 100 mg by mouth at bedtime. Reported on 07/27/2015   triamcinolone cream 0.1 % Commonly known as:  KENALOG Apply 1 application topically 2 (two) times daily.        Review of Systems Pain on front of R skull    Objective:   Physical Exam  Constitutional: She is oriented to person, place, and time.  Cardiovascular: Normal rate, regular rhythm and normal heart sounds.   Pulmonary/Chest: Effort normal and breath sounds normal.  Neurological: She is alert and oriented to person, place, and time.    BP 130/76   Pulse 95   Wt 83 lb (37.6 kg)   LMP  (LMP Unknown)   BMI 16.21 kg/m        Assessment & Plan:    Labs today: sedimentation rate, B12 F/u with current apt in Nov.  Referral to xray of skull for pain and injury at the R front Referral to xray of R wrist for recent nodule

## 2015-10-19 ENCOUNTER — Other Ambulatory Visit: Payer: Self-pay | Admitting: Internal Medicine

## 2015-10-19 DIAGNOSIS — R569 Unspecified convulsions: Secondary | ICD-10-CM

## 2015-10-19 LAB — SEDIMENTATION RATE

## 2015-10-19 LAB — VITAMIN B12: Vitamin B-12: 364 pg/mL (ref 211–946)

## 2015-10-19 MED ORDER — PHENYTOIN SODIUM EXTENDED 100 MG PO CAPS: 100.0000 mg | ORAL_CAPSULE | Freq: Three times a day (TID) | ORAL | 0 refills | Status: DC

## 2015-10-23 ENCOUNTER — Ambulatory Visit: Admission: RE | Admit: 2015-10-23 | Payer: Self-pay | Source: Ambulatory Visit | Admitting: *Deleted

## 2015-10-26 ENCOUNTER — Ambulatory Visit
Admission: RE | Admit: 2015-10-26 | Discharge: 2015-10-26 | Disposition: A | Discharge status: Home / Self Care | Payer: Self-pay | Source: Ambulatory Visit | Attending: Internal Medicine | Admitting: Internal Medicine

## 2015-10-26 DIAGNOSIS — G43809 Other migraine, not intractable, without status migrainosus: Secondary | ICD-10-CM

## 2015-10-26 DIAGNOSIS — M06331 Rheumatoid nodule, right wrist: Secondary | ICD-10-CM | POA: Insufficient documentation

## 2015-10-26 DIAGNOSIS — G43909 Migraine, unspecified, not intractable, without status migrainosus: Secondary | ICD-10-CM | POA: Insufficient documentation

## 2015-10-30 ENCOUNTER — Encounter: Payer: Self-pay | Admitting: Emergency Medicine

## 2015-10-30 ENCOUNTER — Emergency Department
Admission: EM | Admit: 2015-10-30 | Discharge: 2015-10-30 | Disposition: A | Discharge status: Home / Self Care | Payer: Self-pay | Attending: Emergency Medicine | Admitting: Emergency Medicine

## 2015-10-30 DIAGNOSIS — M06331 Rheumatoid nodule, right wrist: Secondary | ICD-10-CM | POA: Insufficient documentation

## 2015-10-30 DIAGNOSIS — F17210 Nicotine dependence, cigarettes, uncomplicated: Secondary | ICD-10-CM | POA: Insufficient documentation

## 2015-10-30 DIAGNOSIS — M19031 Primary osteoarthritis, right wrist: Secondary | ICD-10-CM | POA: Insufficient documentation

## 2015-10-30 DIAGNOSIS — Z79899 Other long term (current) drug therapy: Secondary | ICD-10-CM | POA: Insufficient documentation

## 2015-10-30 DIAGNOSIS — Z791 Long term (current) use of non-steroidal anti-inflammatories (NSAID): Secondary | ICD-10-CM | POA: Insufficient documentation

## 2015-10-30 DIAGNOSIS — M19039 Primary osteoarthritis, unspecified wrist: Secondary | ICD-10-CM

## 2015-10-30 MED ORDER — OXYCODONE-ACETAMINOPHEN 5-325 MG PO TABS
1.0000 | ORAL_TABLET | Freq: Once | ORAL | Status: AC
  Administered 2015-10-30: 1 via ORAL
  Filled 2015-10-30: qty 1

## 2015-10-30 MED ORDER — MELOXICAM 15 MG PO TABS: 15.0000 mg | ORAL_TABLET | Freq: Every day | ORAL | 0 refills | Status: DC

## 2015-10-30 NOTE — ED Triage Notes (Signed)
Pt ambulatory to triage with steady gait, no distress noted. Pt c/o right wrist swelling and pain x3 weeks. Pt was seen at PCP last week and had xrays on 10/23/15, pt reports she is to go back to PCP but unable to wait due to pain.

## 2015-10-30 NOTE — ED Provider Notes (Signed)
[facility] Emergency Department Provider Note  ____________________________________________  Time seen: Approximately 9:06 PM  I have reviewed the triage vital signs and the nursing notes.   HISTORY  Chief Complaint Joint Swelling    HPI [name] is a 50 y.o. female who presents emergency department complaining of right wrist pain. Patient states that pain has been ongoing times several weeks. She has seen her primary care for this complaint prior to arrival and was x-rayed. Patient states that she was supposed to see her primary care tomorrow but could not wait due to the pain. Patient states that she is unable to move her wrist due to the pain. No numbness or tingling distally. No known injury. No medications for this complaint prior to arrival. No complaints at this time.   Past Medical History:  Diagnosis Date  . Allergy   . Seizure (HCC)   . Seizures [facility](HCC)     Patient Active Problem List   Diagnosis Date Noted  . Eczema 05/16/2015  . 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 Seizure disorder (HCC) 08/21/2014    History reviewed. No pertinent surgical history.  Prior to Admission medications   Medication Sig Start Date End Date Taking? Authorizing Provider  diazepam (VALIUM) 2 MG tablet Take 1 tablet (2 mg total) by mouth every 8 (eight) hours as needed for muscle spasms. Patient not taking: Reported on 10/18/2015 05/26/15   Tommi Rumpshonda L Summers, PA-C  divalproex (DEPAKOTE) 500 MG DR tablet Take 1 tablet (500 mg total) by mouth 3 (three) times daily. 08/30/15   Virl Axeon C Chaplin, MD  haloperidol (HALDOL) 2 MG tablet Take 1 mg by mouth at bedtime. Reported on 07/27/2015    Historical Provider, MD  HYDROcodone-acetaminophen (NORCO/VICODIN) 5-325 MG tablet Take 1 tablet by mouth every 4 (four) hours as needed for moderate pain. Patient not taking: Reported on 10/18/2015 05/26/15   Tommi Rumpshonda L Summers, PA-C  meloxicam (MOBIC) 15 MG tablet Take 1 tablet (15 mg  total) by mouth daily. 10/30/15   Delorise RoyalsJonathan D Yolanda Dockendorf, PA-C  phenytoin (DILANTIN) 100 MG ER capsule Take 1 capsule (100 mg total) by mouth 3 (three) times daily. 10/19/15   Virl Axeon C Chaplin, MD  traZODone (DESYREL) 100 MG tablet Take 100 mg by mouth at bedtime. Reported on 07/27/2015    Historical Provider, MD  triamcinolone cream (KENALOG) 0.1 % Apply 1 application topically 2 (two) times daily. 08/22/15   Zachery Daueronna S Odem, FNP    Allergies Flagyl [metronidazole]  History reviewed. No pertinent family history.  Social History Social History  Substance Use Topics  . Smoking status: Current Every Day Smoker    Packs/day: 0.25    Years: 40.00    Types: Cigarettes  . Smokeless tobacco: Current User  . Alcohol use 0.0 oz/week     Review of Systems  Constitutional: No fever/chills Cardiovascular: no chest pain. Musculoskeletal: Positive for right wrist pain Skin: Negative for rash, abrasions, lacerations, ecchymosis. Neurological: Negative for headaches, focal weakness or numbness. 10-point ROS otherwise negative.  ____________________________________________   PHYSICAL EXAM:  VITAL SIGNS: ED Triage Vitals  Enc Vitals Group     BP 10/30/15 1945 (!) 121/102     Pulse Rate 10/30/15 1945 88     Resp 10/30/15 1945 14     Temp 10/30/15 1945 98.5 F (36.9 C)     Temp Source 10/30/15 1945 Oral     SpO2 10/30/15 1945 98 %     Weight 10/30/15 1946 105 lb (47.6  kg)     Height 10/30/15 1946 5' (1.524 m)     Head Circumference --      Peak Flow --      Pain Score 10/30/15 1946 6     Pain Loc --      Pain Edu? --      Excl. in GC? --      Constitutional: Alert and oriented. Well appearing and in no acute distress. Eyes: Conjunctivae are normal. PERRL. EOMI. Head: Atraumatic. Cardiovascular: Normal rate, regular rhythm. Normal S1 and S2.  Good peripheral circulation. Respiratory: Normal respiratory effort without tachypnea or retractions. Lungs CTAB. Good air entry to the bases with  no decreased or absent breath sounds. Musculoskeletal: Full range of motion to all extremities. No gross deformities appreciated.No deformities noted to right wrist on inspection. Nodule is seen over the distal ulna. No erythema. No edema. Patient is limited range of motion due to pain. Patient is exquisitely tender to palpation over the distal ulna. No palpable osseous abnormality. Nodule is palpated to the distal ulna. Sensation intact 5 digits. Cap refill intact 5 digits. Neurologic:  Normal speech and language. No gross focal neurologic deficits are appreciated.  Skin:  Skin is warm, dry and intact. No rash noted. Psychiatric: Mood and affect are normal. Speech and behavior are normal. Patient exhibits appropriate insight and judgement.   ____________________________________________   LABS (all labs ordered are listed, but only abnormal results are displayed)  Labs Reviewed - No data to display ____________________________________________  EKG   ____________________________________________  RADIOLOGY  Right wrist x-ray from 10/26/2015.  EXAM: RIGHT WRIST - COMPLETE 3+ VIEW  COMPARISON:  None  FINDINGS: Osseous demineralization.  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  Small rounded soft tissue calcification at the ulnar aspect of the RIGHT hand at the level of the proximal second metacarpal.  No acute fracture, dislocation or bone destruction.  IMPRESSION: No acute osseous abnormalities.   Images were personally reviewed by myself on this date.  ____________________________________________    PROCEDURES  Procedure(s) performed:    Procedures    Medications  oxyCODONE-acetaminophen (PERCOCET/ROXICET) 5-325 MG per tablet 1 tablet (not administered)     ____________________________________________   INITIAL IMPRESSION / ASSESSMENT AND PLAN / ED COURSE  Pertinent labs & imaging results that were available during my care of the  patient were reviewed by me and considered in my medical decision making (see chart for details).  Review of the [place] CSRS was performed in accordance of the [facility] prior to dispensing any controlled drugs.  Clinical Course    Patient's diagnosis is consistent with Right wrist arthritis with rheumatoid nodule. No trauma from previous imaging 4 days prior. No new images are undertaken at this time. Patient has a follow-up appointment with primary care in the morning and is instructed to keep this appointment. Patient is given a Velcro wrist brace for symptom control.. Patient will be discharged home with prescriptions for anti-inflammatories. She will follow up primary care in the morning.. Patient is given ED precautions to return to the ED for any worsening or new symptoms.     ____________________________________________  FINAL CLINICAL IMPRESSION(S) / ED DIAGNOSES  Final diagnoses:  Wrist arthritis  Rheumatoid nodule of wrist, right (HCC)      NEW MEDICATIONS STARTED DURING THIS VISIT:  New Prescriptions   MELOXICAM (MOBIC) 15 MG TABLET    Take 1 tablet (15 mg total) by mouth daily.        This chart was dictated  using voice recognition software/Dragon. Despite best efforts to proofread, errors can occur which can change the meaning. Any change was purely unintentional.    Racheal Patches, PA-C 10/30/15 2136    Jene Every, MD 10/30/15 2203

## 2015-11-10 ENCOUNTER — Other Ambulatory Visit: Payer: Self-pay | Admitting: Nurse Practitioner

## 2015-11-15 ENCOUNTER — Ambulatory Visit: Payer: Self-pay | Admitting: Internal Medicine

## 2015-11-15 ENCOUNTER — Encounter: Payer: Self-pay | Admitting: Internal Medicine

## 2015-11-15 VITALS — BP 117/73 | HR 80 | Temp 98.8°F

## 2015-11-15 DIAGNOSIS — R569 Unspecified convulsions: Secondary | ICD-10-CM

## 2015-11-15 DIAGNOSIS — G40909 Epilepsy, unspecified, not intractable, without status epilepticus: Secondary | ICD-10-CM

## 2015-11-15 DIAGNOSIS — M25531 Pain in right wrist: Secondary | ICD-10-CM

## 2015-11-15 MED ORDER — DIVALPROEX SODIUM 500 MG PO DR TAB: 500.0000 mg | DELAYED_RELEASE_TABLET | Freq: Three times a day (TID) | ORAL | 11 refills | Status: DC

## 2015-11-15 MED ORDER — IBUPROFEN 800 MG PO TABS: 800.0000 mg | ORAL_TABLET | Freq: Three times a day (TID) | ORAL | 0 refills | Status: DC | PRN

## 2015-11-15 MED ORDER — PHENYTOIN SODIUM EXTENDED 100 MG PO CAPS: 100.0000 mg | ORAL_CAPSULE | Freq: Three times a day (TID) | ORAL | 11 refills | Status: DC

## 2015-11-15 NOTE — Progress Notes (Signed)
   Subjective:    Patient ID: [name], female    DOB: 12-02-1965, 50 y.o.   MRN: [id number]  HPI   Pt f/u for xrays of R wrist and severe pain in R wrist. Pt was at ED 2 weeks ago for severe pain in R wrist and had xray. Results show mild osseous demineralization and calcification. Wrist was put into a soft brace. Pt presents w/ SOB in past 4 days.   Patient Active Problem List   Diagnosis Date Noted  . Eczema 05/16/2015  . 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 Seizure disorder ([place]) 08/21/2014     Medication List       Accurate as of 11/15/15 10:44 AM. Always use your most recent med list.          diazepam 2 MG tablet Commonly known as:  VALIUM Take 1 tablet (2 mg total) by mouth every 8 (eight) hours as needed for muscle spasms.   divalproex 500 MG DR tablet Commonly known as:  DEPAKOTE Take 1 tablet (500 mg total) by mouth 3 (three) times daily.   haloperidol 2 MG tablet Commonly known as:  HALDOL Take 1 mg by mouth at bedtime. Reported on 07/27/2015   HYDROcodone-acetaminophen 5-325 MG tablet Commonly known as:  NORCO/VICODIN Take 1 tablet by mouth every 4 (four) hours as needed for moderate pain.   meloxicam 15 MG tablet Commonly known as:  MOBIC Take 1 tablet (15 mg total) by mouth daily.   phenytoin 100 MG ER capsule Commonly known as:  DILANTIN Take 1 capsule (100 mg total) by mouth 3 (three) times daily.   traZODone 100 MG tablet Commonly known as:  DESYREL Take 100 mg by mouth at bedtime. Reported on 07/27/2015   triamcinolone cream 0.1 % Commonly known as:  KENALOG Apply 1 application topically 2 (two) times daily.        Review of Systems Examination of R wrist. Circulation is normal. Numbness in hand.  Ortho ( Dr [name]) examination today in office,: no apparent severe swelling. Pain is in distal ulna. Her response  to pain appears  ist of proportion to clinical findings.    Objective:   Physical Exam  Constitutional: She is  oriented to person, place, and time.  Cardiovascular: Normal rate, regular rhythm and normal heart sounds.   Pulmonary/Chest: Effort normal and breath sounds normal. She has no wheezes.  Neurological: She is alert and oriented to person, place, and time.    BP 117/73   Pulse 80   Temp 98.8 F (37.1 C) (Oral)   LMP  (LMP Unknown)        Assessment & Plan:   Labs today: Met C, CBC, Dilantin levels, Depakate level, Ua, Uric acid, Rheumatoid panel

## 2015-11-15 NOTE — Patient Instructions (Signed)
Labs today: Met C, CBC, Dilantin level, Depakote level, Ua, Uric acid, RA panel

## 2015-11-16 LAB — CBC
Hematocrit: 43.6 % (ref 34.0–46.6)
Hemoglobin: 14.3 g/dL (ref 11.1–15.9)
MCH: 31.7 pg (ref 26.6–33.0)
MCHC: 32.8 g/dL (ref 31.5–35.7)
MCV: 97 fL (ref 79–97)
PLATELETS: 189 10*3/uL (ref 150–379)
RBC: 4.51 x10E6/uL (ref 3.77–5.28)
RDW: 13.7 % (ref 12.3–15.4)
WBC: 6.8 10*3/uL (ref 3.4–10.8)

## 2015-11-16 LAB — URINALYSIS
BILIRUBIN UA: NEGATIVE
GLUCOSE, UA: NEGATIVE
Leukocytes, UA: NEGATIVE
Nitrite, UA: NEGATIVE
Protein, UA: NEGATIVE
RBC UA: NEGATIVE
SPEC GRAV UA: 1.02 (ref 1.005–1.030)
UUROB: 0.2 mg/dL (ref 0.2–1.0)
pH, UA: 5.5 (ref 5.0–7.5)

## 2015-11-16 LAB — COMPREHENSIVE METABOLIC PANEL
ALBUMIN: 5.1 g/dL (ref 3.5–5.5)
ALK PHOS: 75 IU/L (ref 39–117)
ALT: 32 IU/L (ref 0–32)
AST: 59 IU/L — AB (ref 0–40)
Albumin/Globulin Ratio: 1.8 (ref 1.2–2.2)
BILIRUBIN TOTAL: 0.8 mg/dL (ref 0.0–1.2)
BUN / CREAT RATIO: 12 (ref 9–23)
BUN: 8 mg/dL (ref 6–24)
CHLORIDE: 96 mmol/L (ref 96–106)
CO2: 25 mmol/L (ref 18–29)
Calcium: 10.3 mg/dL — ABNORMAL HIGH (ref 8.7–10.2)
Creatinine, Ser: 0.67 mg/dL (ref 0.57–1.00)
GFR calc Af Amer: 117 mL/min/{1.73_m2} (ref >59)
GFR calc non Af Amer: 101 mL/min/{1.73_m2} (ref >59)
GLOBULIN, TOTAL: 2.9 g/dL (ref 1.5–4.5)
Glucose: 82 mg/dL (ref 65–99)
POTASSIUM: 4.7 mmol/L (ref 3.5–5.2)
SODIUM: 138 mmol/L (ref 134–144)
Total Protein: 8 g/dL (ref 6.0–8.5)

## 2015-11-16 LAB — URIC ACID: URIC ACID: 3.2 mg/dL (ref 2.5–7.1)

## 2015-11-16 LAB — RHEUMATOID FACTOR: Rhuematoid fact SerPl-aCnc: <10 IU/mL (ref 0.0–13.9)

## 2015-11-16 LAB — PHENYTOIN LEVEL, FREE AND TOTAL

## 2015-11-16 LAB — PHENYTOIN LEVEL, TOTAL

## 2015-12-07 ENCOUNTER — Other Ambulatory Visit: Payer: Self-pay

## 2015-12-12 ENCOUNTER — Other Ambulatory Visit: Payer: Self-pay

## 2015-12-14 ENCOUNTER — Ambulatory Visit: Payer: Self-pay

## 2015-12-14 ENCOUNTER — Other Ambulatory Visit: Payer: Self-pay

## 2015-12-14 DIAGNOSIS — G40909 Epilepsy, unspecified, not intractable, without status epilepticus: Secondary | ICD-10-CM

## 2015-12-15 ENCOUNTER — Other Ambulatory Visit: Payer: Self-pay | Admitting: Internal Medicine

## 2015-12-15 DIAGNOSIS — R52 Pain, unspecified: Secondary | ICD-10-CM

## 2015-12-15 LAB — CBC WITH DIFFERENTIAL/PLATELET
BASOS ABS: 0 10*3/uL (ref 0.0–0.2)
Basos: 0 %
EOS (ABSOLUTE): 0.1 10*3/uL (ref 0.0–0.4)
Eos: 1 %
Hematocrit: 41.1 % (ref 34.0–46.6)
Hemoglobin: 14.6 g/dL (ref 11.1–15.9)
Immature Grans (Abs): 0 10*3/uL (ref 0.0–0.1)
Immature Granulocytes: 0 %
LYMPHS ABS: 3 10*3/uL (ref 0.7–3.1)
Lymphs: 42 %
MCH: 33.7 pg — AB (ref 26.6–33.0)
MCHC: 35.5 g/dL (ref 31.5–35.7)
MCV: 95 fL (ref 79–97)
MONOS ABS: 0.4 10*3/uL (ref 0.1–0.9)
Monocytes: 6 %
NEUTROS ABS: 3.6 10*3/uL (ref 1.4–7.0)
Neutrophils: 51 %
Platelets: 156 10*3/uL (ref 150–379)
RBC: 4.33 x10E6/uL (ref 3.77–5.28)
RDW: 13.1 % (ref 12.3–15.4)
WBC: 7.1 10*3/uL (ref 3.4–10.8)

## 2015-12-15 LAB — COMPREHENSIVE METABOLIC PANEL
A/G RATIO: 1.6 (ref 1.2–2.2)
ALBUMIN: 4.6 g/dL (ref 3.5–5.5)
ALT: 15 IU/L (ref 0–32)
AST: 35 IU/L (ref 0–40)
Alkaline Phosphatase: 74 IU/L (ref 39–117)
BILIRUBIN TOTAL: 0.6 mg/dL (ref 0.0–1.2)
BUN / CREAT RATIO: 12 (ref 9–23)
BUN: 8 mg/dL (ref 6–24)
CHLORIDE: 98 mmol/L (ref 96–106)
CO2: 24 mmol/L (ref 18–29)
Calcium: 10.1 mg/dL (ref 8.7–10.2)
Creatinine, Ser: 0.66 mg/dL (ref 0.57–1.00)
GFR calc non Af Amer: 102 mL/min/{1.73_m2} (ref >59)
GFR, EST AFRICAN AMERICAN: 117 mL/min/{1.73_m2} (ref >59)
GLOBULIN, TOTAL: 2.8 g/dL (ref 1.5–4.5)
Glucose: 71 mg/dL (ref 65–99)
POTASSIUM: 4.4 mmol/L (ref 3.5–5.2)
SODIUM: 138 mmol/L (ref 134–144)
TOTAL PROTEIN: 7.4 g/dL (ref 6.0–8.5)

## 2015-12-15 LAB — URINALYSIS
Bilirubin, UA: NEGATIVE
Glucose, UA: NEGATIVE
KETONES UA: NEGATIVE
LEUKOCYTES UA: NEGATIVE
NITRITE UA: NEGATIVE
Protein, UA: NEGATIVE
RBC, UA: NEGATIVE
Specific Gravity, UA: 1.006 (ref 1.005–1.030)
Urobilinogen, Ur: 0.2 mg/dL (ref 0.2–1.0)
pH, UA: 5.5 (ref 5.0–7.5)

## 2015-12-15 LAB — MICROALBUMIN / CREATININE URINE RATIO
Creatinine, Urine: 29.6 mg/dL
MICROALB/CREAT RATIO: 74.7 mg/g{creat} — AB (ref 0.0–30.0)
MICROALBUM., U, RANDOM: 22.1 ug/mL

## 2015-12-15 LAB — PHENYTOIN LEVEL, TOTAL

## 2015-12-15 LAB — VALPROIC ACID LEVEL

## 2015-12-19 ENCOUNTER — Ambulatory Visit: Payer: Self-pay | Admitting: Nurse Practitioner

## 2015-12-19 VITALS — HR 107 | Temp 98.2°F | Wt 100.0 lb

## 2015-12-19 DIAGNOSIS — M25531 Pain in right wrist: Secondary | ICD-10-CM

## 2015-12-19 MED ORDER — CYCLOBENZAPRINE HCL 10 MG PO TABS: 10.0000 mg | ORAL_TABLET | Freq: Three times a day (TID) | ORAL | 0 refills | Status: DC

## 2015-12-19 MED ORDER — NICOTINE 10 MG IN INHA: 1.0000 | RESPIRATORY_TRACT | 0 refills | Status: DC | PRN

## 2015-12-19 MED ORDER — ONDANSETRON HCL 4 MG PO TABS: 4.0000 mg | ORAL_TABLET | Freq: Three times a day (TID) | ORAL | 0 refills | Status: DC | PRN

## 2015-12-19 NOTE — Progress Notes (Signed)
Headache for 2 days,   Generally does not feel well.    States she has been taking her seizure medications, last seizure on 11/08  Was treated with an IV medication and sent home.   Here crying tonight continues with significant pain of her R wrist  One er  Reports states a rhematoid nodule, and pt is wear ing splint for carpal tunnel syndrome  Pt denies any etoh of substance use this pm  Describes h/a pain as throbbing, predominantly R side.  Has long history of migraine headaches.    Wants to quit smoking  Is followed by [facility] for her mental health issues.    Exam: Alert, crying, depressed, denies suicidal ideation  AP RRR, BBrS clear, carotid bruits not present No lower extremity edema   Plan:    Pt has 800 mg of ibuprofen, will allow 10 mg of cyclobenzaprine, which she may take with the ibuprofen. Would anticipate helpful for her headache.    Will implement nicotrol inhalers for smoking cessation

## 2015-12-21 LAB — RHEUMATOID ARTHRITIS PROFILE: CYCLIC CITRULLIN PEPTIDE AB: 5 U (ref 0–19)

## 2016-01-02 ENCOUNTER — Ambulatory Visit: Payer: Self-pay

## 2016-01-04 ENCOUNTER — Other Ambulatory Visit: Payer: Self-pay | Admitting: Nurse Practitioner

## 2016-01-08 ENCOUNTER — Other Ambulatory Visit: Payer: Self-pay | Admitting: Nurse Practitioner

## 2016-01-09 ENCOUNTER — Other Ambulatory Visit: Payer: Self-pay

## 2016-01-09 DIAGNOSIS — R110 Nausea: Secondary | ICD-10-CM

## 2016-01-09 MED ORDER — ONDANSETRON HCL 4 MG PO TABS: ORAL_TABLET | ORAL | 0 refills | Status: DC

## 2016-01-10 ENCOUNTER — Other Ambulatory Visit: Payer: Self-pay | Admitting: Internal Medicine

## 2016-01-10 DIAGNOSIS — R52 Pain, unspecified: Secondary | ICD-10-CM

## 2016-01-24 ENCOUNTER — Ambulatory Visit: Payer: Self-pay | Admitting: Internal Medicine

## 2016-01-31 ENCOUNTER — Ambulatory Visit: Payer: Self-pay | Admitting: Internal Medicine

## 2016-01-31 ENCOUNTER — Other Ambulatory Visit: Payer: Self-pay | Admitting: Nurse Practitioner

## 2016-02-02 ENCOUNTER — Other Ambulatory Visit: Payer: Self-pay | Admitting: Adult Health Nurse Practitioner

## 2016-02-02 ENCOUNTER — Other Ambulatory Visit: Payer: Self-pay | Admitting: Internal Medicine

## 2016-02-02 DIAGNOSIS — R52 Pain, unspecified: Secondary | ICD-10-CM

## 2016-02-06 ENCOUNTER — Other Ambulatory Visit: Payer: Self-pay | Admitting: Nurse Practitioner

## 2016-02-06 ENCOUNTER — Other Ambulatory Visit: Payer: Self-pay | Admitting: Internal Medicine

## 2016-02-06 DIAGNOSIS — R52 Pain, unspecified: Secondary | ICD-10-CM

## 2016-02-28 ENCOUNTER — Ambulatory Visit: Payer: Self-pay | Admitting: Internal Medicine

## 2016-02-28 ENCOUNTER — Telehealth: Payer: Self-pay

## 2016-02-28 ENCOUNTER — Encounter: Payer: Self-pay | Admitting: Internal Medicine

## 2016-02-28 VITALS — BP 137/80 | HR 96 | Temp 98.5°F | Wt 99.2 lb

## 2016-02-28 DIAGNOSIS — N6002 Solitary cyst of left breast: Secondary | ICD-10-CM

## 2016-02-28 DIAGNOSIS — Z0000 Encounter for general adult medical examination without abnormal findings: Secondary | ICD-10-CM

## 2016-02-28 DIAGNOSIS — R52 Pain, unspecified: Secondary | ICD-10-CM

## 2016-02-28 MED ORDER — TRIAMCINOLONE ACETONIDE 0.1 % EX CREA: TOPICAL_CREAM | CUTANEOUS | 1 refills | Status: DC

## 2016-02-28 MED ORDER — CYCLOBENZAPRINE HCL 10 MG PO TABS: 10.0000 mg | ORAL_TABLET | Freq: Three times a day (TID) | ORAL | 1 refills | Status: DC

## 2016-02-28 MED ORDER — IBUPROFEN 800 MG PO TABS: 800.0000 mg | ORAL_TABLET | Freq: Three times a day (TID) | ORAL | 0 refills | Status: DC | PRN

## 2016-02-28 MED ORDER — NICOTINE 10 MG IN INHA: RESPIRATORY_TRACT | 2 refills | Status: DC

## 2016-02-28 NOTE — Patient Instructions (Signed)
RTC in 3 months. Labs prior to OV dilantin, depakote, met c, and cbc.

## 2016-02-28 NOTE — Telephone Encounter (Signed)
Referrals sent for ortho surgery and general surgery for cyst on wrist and breast. Waiting on word about is pt's charity care got approved or not.

## 2016-02-28 NOTE — Progress Notes (Signed)
   Subjective:    Patient ID: [name], female    DOB: 07/17/1965, 51 y.o.   MRN: [id number]  HPI BP 137/80   Pulse 96   Temp 98.5 F (36.9 C)   Wt 99 lb 3.2 oz (45 kg)   BMI 19.37 kg/m   Allergies as of 02/28/2016      Reactions   Flagyl [metronidazole] Other (See Comments)   "my eyes go back in the back of my head"      Medication List       Accurate as of 02/28/16  9:42 AM. Always use your most recent med list.          cyclobenzaprine 10 MG tablet Commonly known as:  FLEXERIL Take 1 tablet by mouth 3 times daily.   diazepam 2 MG tablet Commonly known as:  VALIUM Take 1 tablet (2 mg total) by mouth every 8 (eight) hours as needed for muscle spasms.   divalproex 500 MG DR tablet Commonly known as:  DEPAKOTE Take 1 tablet (500 mg total) by mouth 3 (three) times daily.   haloperidol 2 MG tablet Commonly known as:  HALDOL Take 1 mg by mouth at bedtime. Reported on 07/27/2015   HYDROcodone-acetaminophen 5-325 MG tablet Commonly known as:  NORCO/VICODIN Take 1 tablet by mouth every 4 (four) hours as needed for moderate pain.   ibuprofen 800 MG tablet Commonly known as:  ADVIL,MOTRIN Take 1 tablet (800 mg total) by mouth every 8 (eight) hours as needed.   meloxicam 15 MG tablet Commonly known as:  MOBIC Take 1 tablet (15 mg total) by mouth daily.   NICOTROL 10 MG inhaler Generic drug:  nicotine Inhale 16-20 Cartridges daily (20 min of continuous puffing total) into the lungs as needed for smoking cessation.   ondansetron 4 MG tablet Commonly known as:  ZOFRAN Take 1 tablet by mouth every 8 hours as needed for nausea or vomiting.   phenytoin 100 MG ER capsule Commonly known as:  DILANTIN Take 1 capsule (100 mg total) by mouth 3 (three) times daily.   traZODone 100 MG tablet Commonly known as:  DESYREL Take 100 mg by mouth at bedtime. Reported on 07/27/2015   triamcinolone cream 0.1 % Commonly known as:  KENALOG Apply 1 application topically 2  (two) times daily.         Review of Systems Pt presents with wrist pain and needing med refills. Pt c/o of swelling and pain in wrist. Pt c/o of knot on left breast and is tender and a burning feeling. Objective:   Physical Exam  Constitutional: She is oriented to person, place, and time.  Cardiovascular: Normal rate, regular rhythm and normal heart sounds.   Pulmonary/Chest: Effort normal and breath sounds normal.  Neurological: She is alert and oriented to person, place, and time.          Assessment & Plan:  No signs of arthritis in wrist. Has a ? ganglion cyst.  Needs referral to ortho for surgical intervention. Knot on breast seems to be a?  ingrown hair that is causing a cyst and is tender to palpation. Will need to be lanced/ excised.  Referral to general surgery for removal of 1 1/2 x 1 cm subcutaneous cyst. RTC in 3 months and labs prior the week before.

## 2016-02-29 LAB — CBC WITH DIFFERENTIAL
BASOS ABS: 0 10*3/uL (ref 0.0–0.2)
Basos: 0 %
EOS (ABSOLUTE): 0.1 10*3/uL (ref 0.0–0.4)
Eos: 1 %
HEMOGLOBIN: 14 g/dL (ref 11.1–15.9)
Hematocrit: 41 % (ref 34.0–46.6)
IMMATURE GRANULOCYTES: 1 %
Immature Grans (Abs): 0.1 10*3/uL (ref 0.0–0.1)
LYMPHS ABS: 1.8 10*3/uL (ref 0.7–3.1)
Lymphs: 18 %
MCH: 32 pg (ref 26.6–33.0)
MCHC: 34.1 g/dL (ref 31.5–35.7)
MCV: 94 fL (ref 79–97)
MONOCYTES: 6 %
MONOS ABS: 0.6 10*3/uL (ref 0.1–0.9)
NEUTROS ABS: 7.5 10*3/uL — AB (ref 1.4–7.0)
NEUTROS PCT: 74 %
RBC: 4.38 x10E6/uL (ref 3.77–5.28)
RDW: 13.9 % (ref 12.3–15.4)
WBC: 9.9 10*3/uL (ref 3.4–10.8)

## 2016-02-29 LAB — COMPREHENSIVE METABOLIC PANEL
ALBUMIN: 4.6 g/dL (ref 3.5–5.5)
ALT: 14 IU/L (ref 0–32)
AST: 27 IU/L (ref 0–40)
Albumin/Globulin Ratio: 1.6 (ref 1.2–2.2)
Alkaline Phosphatase: 99 IU/L (ref 39–117)
BILIRUBIN TOTAL: 0.3 mg/dL (ref 0.0–1.2)
BUN / CREAT RATIO: 11 (ref 9–23)
BUN: 6 mg/dL (ref 6–24)
CO2: 19 mmol/L (ref 18–29)
CREATININE: 0.55 mg/dL — AB (ref 0.57–1.00)
Calcium: 9.5 mg/dL (ref 8.7–10.2)
Chloride: 102 mmol/L (ref 96–106)
GFR calc non Af Amer: 108 mL/min/{1.73_m2} (ref >59)
GFR, EST AFRICAN AMERICAN: 125 mL/min/{1.73_m2} (ref >59)
GLOBULIN, TOTAL: 2.8 g/dL (ref 1.5–4.5)
GLUCOSE: 75 mg/dL (ref 65–99)
Potassium: 4.7 mmol/L (ref 3.5–5.2)
SODIUM: 143 mmol/L (ref 134–144)
TOTAL PROTEIN: 7.4 g/dL (ref 6.0–8.5)

## 2016-02-29 LAB — TSH: TSH: 0.67 u[IU]/mL (ref 0.450–4.500)

## 2016-02-29 LAB — PHENYTOIN LEVEL, TOTAL

## 2016-03-06 ENCOUNTER — Encounter: Payer: Self-pay | Admitting: General Surgery

## 2016-03-06 ENCOUNTER — Ambulatory Visit (INDEPENDENT_AMBULATORY_CARE_PROVIDER_SITE_OTHER): Payer: Self-pay | Admitting: General Surgery

## 2016-03-06 ENCOUNTER — Ambulatory Visit: Payer: Self-pay | Admitting: Family Medicine

## 2016-03-06 ENCOUNTER — Ambulatory Visit: Payer: Self-pay | Admitting: Internal Medicine

## 2016-03-06 VITALS — BP 116/70 | HR 120 | Temp 98.1°F | Ht 60.0 in | Wt 96.0 lb

## 2016-03-06 DIAGNOSIS — N6002 Solitary cyst of left breast: Secondary | ICD-10-CM

## 2016-03-06 NOTE — Patient Instructions (Addendum)
I will call you once we schedule your appointment at [facility] [phone].  Address: [address], [place], [place] [address]  Once we have results, we will call you. Then we will schedule a follow up appointment to possibly excise the cyst.

## 2016-03-06 NOTE — Progress Notes (Signed)
Patient ID: [name], female   DOB: 02/18/1965, 51 y.o.   MRN: [id number]  CC: left breast cyst  HPI [name] is a 51 y.o. female presents to clinic today for evaluation of a left breast cyst. Patient reports the area has been tender to palpation but has come and gone numerous times over the last several weeks. It was stopped by her primary care prior to be a sebaceous cyst. Patient reports a similar area to her left axilla that was drained at some point in the past. Patient is a very poor historian. She reports that she had a mammogram before but cannot recall when it was thinks it was greater than 20 years ago. She cannot recall why she had a mammogram in her 30s. She is otherwise in her usual state of health. He is very nervous in the clinic today. She denies any fevers, chills, nausea, vomiting, chest pain, shortness of breath, diarrhea, constipation.  HPI  Past Medical History:  Diagnosis Date  . Allergy   . Cocaine abuse in remission   . Eczema   . Nervous   . Scoliosis   . Seizure ([facility])   . Seizures ([facility])     Past Surgical History:  Procedure Laterality Date  . APPENDECTOMY  2000  . BACK SURGERY  2012  . DILATION AND CURETTAGE OF UTERUS      Family History  Problem Relation Age of Onset  . Epilepsy Mother   . Seizures Mother   . Heart disease Father     Social History Social History  Substance Use Topics  . Smoking status: Current Every Day Smoker    Packs/day: 0.50    Years: 50.00    Types: Cigarettes  . Smokeless tobacco: Current User     Comment: Interested in resources   . Alcohol use No    Allergies  Allergen Reactions  . Flagyl [Metronidazole] Other (See Comments)    "my eyes go back in the back of my head"    Current Outpatient Prescriptions  Medication Sig Dispense Refill  . cyclobenzaprine (FLEXERIL) 10 MG tablet Take 1 tablet (10 mg total) by mouth 3 (three) times daily. 90 tablet 1  . diazepam (VALIUM) 2 MG tablet Take 1 tablet (2  mg total) by mouth every 8 (eight) hours as needed for muscle spasms. 9 tablet 0  . divalproex (DEPAKOTE) 500 MG DR tablet Take 1 tablet (500 mg total) by mouth 3 (three) times daily. 270 tablet 11  . haloperidol (HALDOL) 2 MG tablet Take 1 mg by mouth at bedtime. Reported on 07/27/2015    . ibuprofen (ADVIL,MOTRIN) 800 MG tablet Take 1 tablet (800 mg total) by mouth every 8 (eight) hours as needed. 45 tablet 0  . nicotine (NICOTROL) 10 MG inhaler Inhale 16-20 Cartridges daily (20 min of continuous puffing total) into the lungs as needed for smoking cessation. 168 each 2  . phenytoin (DILANTIN) 100 MG ER capsule Take 1 capsule (100 mg total) by mouth 3 (three) times daily. 270 capsule 11  . traZODone (DESYREL) 100 MG tablet Take 100 mg by mouth at bedtime. Reported on 07/27/2015    . triamcinolone cream (KENALOG) 0.1 % Apply 1 application topically 2 (two) times daily. 30 g 1   No current facility-administered medications for this visit.      Review of Systems A Multi-point review of systems was asked and was negative except for the findings documented in the history of present illness  Physical Exam  Blood pressure 116/70, pulse (!) 120, temperature 98.1 F (36.7 C), temperature source Oral, height 5' (1.524 m), weight 43.5 kg (96 lb). CONSTITUTIONAL: No acute distress. EYES: Pupils are equal, round, and reactive to light, Sclera are non-icteric. EARS, NOSE, MOUTH AND THROAT: The oropharynx is clear. The oral mucosa is pink and moist, poor dentition. Hearing is intact to voice. LYMPH NODES:  Lymph nodes in the neck and bilateral axilla are normal. BREAST: Bilateral breast examined. There is a tender 3 mm superficial area at the 12:00 position on the left breast. There is an additional area of tenderness at the 3:00 position on the left breast as well. There are no palpable dominant masses to either breast. RESPIRATORY:  Lungs are clear. There is normal respiratory effort, with equal breath  sounds bilaterally, and without pathologic use of accessory muscles. CARDIOVASCULAR: Heart is tachycardic GI: The abdomen is soft, nontender, and nondistended. There are no palpable masses. There is no hepatosplenomegaly. There are normal bowel sounds in all quadrants. GU: Rectal deferred.   MUSCULOSKELETAL: Normal muscle strength and tone. No cyanosis or edema.   SKIN: Turgor is good and there are no pathologic skin lesions or ulcers. NEUROLOGIC: Motor and sensation is grossly normal. Cranial nerves are grossly intact. PSYCH:  Oriented to person, place and time. Affect is normal.  Data Reviewed There are no recent images to review. Patient had labs 1 week ago that are all within normal limits I have personally reviewed the patient's imaging, laboratory findings and medical records.    Assessment    Left breast cyst    Plan    51 year old female with a tender left breast cyst and additional area of tenderness lateral to this. Unclear if this is a breast lesion or a skin lesion. Patient has not had any screening or diagnostic imaging performed within our system. Counseled the patient that breast imaging is indicated prior to undergoing any incision and drainage or excision. She voiced understanding. Plan to proceed with mammography and follow-up afterwards prior to excising the superficial lesion.     Time spent with the patient was 30 minutes, with more than 50% of the time spent in face-to-face education, counseling and care coordination.     Ricarda Frame, MD FACS General Surgeon 03/06/2016, 2:15 PM

## 2016-03-07 ENCOUNTER — Telehealth: Payer: Self-pay

## 2016-03-07 NOTE — Telephone Encounter (Signed)
Spoke with Melissa from [facility]. They have moved patient into a cancellation spot tomorrow (2/2) at 0940am.  Call made to patient at this time. New appointment time and date given. Patient read back appointment information and directions were given.

## 2016-03-07 NOTE — Telephone Encounter (Signed)
Call made to [facility] at this time to see if patient can be moved up. No answer. Left voicemail. Awaiting a call back at this time.

## 2016-03-07 NOTE — Telephone Encounter (Signed)
Patient was in for an appointment yesterday. She is having pain where her cyst is on her left breast. Patient wants to know if we can schedule her quickly to get a mammogram. She is concerned. Please call patient and advice.

## 2016-03-08 ENCOUNTER — Ambulatory Visit
Admission: RE | Admit: 2016-03-08 | Discharge: 2016-03-08 | Disposition: A | Discharge status: Home / Self Care | Payer: Self-pay | Source: Ambulatory Visit | Attending: General Surgery | Admitting: General Surgery

## 2016-03-08 ENCOUNTER — Telehealth: Payer: Self-pay

## 2016-03-08 DIAGNOSIS — N6002 Solitary cyst of left breast: Secondary | ICD-10-CM | POA: Insufficient documentation

## 2016-03-08 DIAGNOSIS — N6310 Unspecified lump in the right breast, unspecified quadrant: Secondary | ICD-10-CM | POA: Insufficient documentation

## 2016-03-08 NOTE — Telephone Encounter (Signed)
I have called patient back. No answer. I have left a message on voicemail. Per Dr Tonita CongWoodham, patient will need to be seen back in the office prior to scheduling a procedure.

## 2016-03-08 NOTE — Telephone Encounter (Signed)
Patient just came back from the cancer center,she had her mammogram done. They told her to tell Dr. Tonita CongWoodham to go ahead and have her surgery scheduled. Please call patient and advice.

## 2016-03-13 ENCOUNTER — Ambulatory Visit (INDEPENDENT_AMBULATORY_CARE_PROVIDER_SITE_OTHER): Payer: Self-pay | Admitting: Family Medicine

## 2016-03-13 ENCOUNTER — Encounter: Payer: Self-pay | Admitting: Family Medicine

## 2016-03-13 VITALS — BP 144/74 | HR 102 | Ht 59.0 in | Wt 98.0 lb

## 2016-03-13 DIAGNOSIS — M25531 Pain in right wrist: Secondary | ICD-10-CM

## 2016-03-13 NOTE — Progress Notes (Signed)
  [name] - 51 y.o. female MRN [id number]  Date of birth: 07/04/1965  SUBJECTIVE:  Including CC & ROS.   [name] is a 51 year old female that is presenting with right dorsal wrist pain. She was told she had a ganglion of that wrist. She reports the symptoms started about 6 months ago. She was evaluated in the emergency department for 5 months ago. She is provided with a wrist brace that she wears daily. She reports that there is a bump on the dorsal aspect of her wrist that cause this pain. She also reports having some numbness on the dorsal aspect of her second through fifth fingers. This numbness has been present for 2 days. She denies any injuries or falls.  ROS: No unexpected weight loss, fever, chills, swelling, instability, redness, otherwise see HPI    HISTORY: Past Medical, Surgical, Social, and Family History Reviewed & Updated per EMR.   Pertinent Historical Findings include: PMSHx -  seizure disorder PSHx -  Current tobacco use  FHx -  None  Medications - ambien, depakote, haldol, dilantin  DATA REVIEWED: 10/26/15: Right wrist: No acute osseous abnormalities.  PHYSICAL EXAM:  VS: BP:(!) 144/74  HR:(!) 102bpm  TEMP: ( )  RESP:   HT:4\' 11"  (149.9 cm)   WT:98 lb (44.5 kg)  BMI:19.8 PHYSICAL EXAM: Gen: NAD, alert, cooperative with exam, well-appearing HEENT: clear conjunctiva, EOMI CV:  no edema, capillary refill brisk,  Resp: non-labored, normal speech Skin: no rashes, normal turgor  Neuro: no gross deficits.  Psych:  alert and oriented Right Wrist:  Tenderness to palpation of the distal radial and ulna. No obvious ganglion cyst present. No tenderness to palpation over the snuffbox. No tenderness to palpation of the carpal bones. No tenderness to palpation of the metacarpals. No signs of atrophy. Normal thumb opposition. Normal pincer grasp. Normal finger abduction and abduction. Negative Finkelstein's test. Normal grip strength. Neurovascular  intact.  Limited ultrasound: Right wrist: The dorsal compartments of the wrist were viewed in short axis. These were not demonstrating any abnormalities. There is no ganglion cyst observed on ultrasound.  Summary: Normal exam.  ASSESSMENT & PLAN:   Right wrist pain No ganglion cyst present on exam or ultrasound. Exam was inconsistent at times so no clear definitive diagnosis at this point. Possible that the ganglion ruptured on its own and some swelling is occurring as to why she's having some generalized pain on the distal aspect of her forearm. - Provided home exercises for wrist sprain - Encouraged to follow-up sooner if the numbness is not resolving.

## 2016-03-15 DIAGNOSIS — M25531 Pain in right wrist: Secondary | ICD-10-CM | POA: Insufficient documentation

## 2016-03-15 NOTE — Assessment & Plan Note (Signed)
No ganglion cyst present on exam or ultrasound. Exam was inconsistent at times so no clear definitive diagnosis at this point. Possible that the ganglion ruptured on its own and some swelling is occurring as to why she's having some generalized pain on the distal aspect of her forearm. - Provided home exercises for wrist sprain - Encouraged to follow-up sooner if the numbness is not resolving.

## 2016-03-18 ENCOUNTER — Ambulatory Visit (INDEPENDENT_AMBULATORY_CARE_PROVIDER_SITE_OTHER): Payer: Self-pay | Admitting: General Surgery

## 2016-03-18 ENCOUNTER — Encounter: Payer: Self-pay | Admitting: General Surgery

## 2016-03-18 VITALS — BP 150/91 | HR 98 | Temp 99.4°F | Ht 59.0 in | Wt 99.4 lb

## 2016-03-18 DIAGNOSIS — N6089 Other benign mammary dysplasias of unspecified breast: Secondary | ICD-10-CM

## 2016-03-18 NOTE — Patient Instructions (Signed)
We have your surgery scheduled for 03/21/16 with Dr.Diego Pabon at [facility]. Please see blue sheet for surgery information. Please call our office if you have any questions or concerns.

## 2016-03-18 NOTE — Progress Notes (Signed)
Outpatient Surgical Follow Up  03/18/2016  [name] is an 51 y.o. female.   Chief Complaint  Patient presents with  . Follow-up    Bilateral Breast Cyst- Discuss Mammogram and Surgery    HPI: 51 year old female returns to clinic for follow-up of bilateral superficial cyst of the superior aspect of her breasts. She was evaluated for this 2 weeks ago and sent for imaging studies. Since that time there in the left has continued to be tender in the area on the right continues to be intermittently tender. There were no signs of infection, drainage, changes in size or location. They continue to be palpable. Imaging studies did not show any suspicious findings. She reports no other changes. She denies any fevers, chills, nausea, vomiting, shortness of breath, diarrhea, constipation.  Past Medical History:  Diagnosis Date  . Allergy   . Cocaine abuse in remission   . Eczema   . Nervous   . Scoliosis   . Seizure ([facility])   . Seizures ([facility])     Past Surgical History:  Procedure Laterality Date  . APPENDECTOMY  2000  . BACK SURGERY  2012  . DILATION AND CURETTAGE OF UTERUS      Family History  Problem Relation Age of Onset  . Epilepsy Mother   . Seizures Mother   . Heart disease Father   . Epilepsy Maternal Grandmother   . Heart attack Maternal Grandmother     Social History:  reports that she has been smoking Cigarettes.  She has a 25.00 pack-year smoking history. She uses smokeless tobacco. She reports that she uses drugs, including Benzodiazepines and Cocaine. She reports that she does not drink alcohol.  Allergies:  Allergies  Allergen Reactions  . Flagyl [Metronidazole] Other (See Comments)    "my eyes go back in the back of my head"    Medications reviewed.    ROS A multipoint review of systems was completed, all pertinent positives and negatives are documented within the history of present illness the remainder are negative.   BP (!) 150/91   Pulse 98    Temp 99.4 F (37.4 C) (Oral)   Ht 4\' 11"  (1.499 m)   Wt 45.1 kg (99 lb 6.4 oz)   BMI 20.08 kg/m   Physical Exam Gen.: No acute distress Neck: Supple and nontender Lymph nodes: No evidence of cervical or clavicular lymphadenopathy Breasts: Bilateral breasts examined. No palpable Abnormalities within the breast proper. Palpable superficial cysts in the midclavicular line bilaterally Chest: Clear to auscultation Heart: Regular rhythm Abdomen: Soft and nontender    No results found for this or any previous visit (from the past 48 hour(s)). No results found.  Assessment/Plan:  1. Cyst of skin of breast, unspecified laterality 51 year old female with bilateral superficial skin cyst of the breast/chest wall. Patient is very anxious and nervous in clinic today. Due to her anxiety discussed the presents of proceeding with excisional biopsy of bilateral lesions with anesthesia on board. The surgical procedure was described in detail. Plan to proceed to the operating room for superficial cyst excision with my partner Dr. Everlene FarrierPabon later this week. All questions answered to the patient's satisfaction.  A total of 25 minutes was used for this encounter with greater than 25 minutes of a used for counseling and coordination of care.     Ricarda Frameharles Donivan Thammavong, MD [facility] General Surgeon  03/18/2016,12:53 PM

## 2016-03-19 ENCOUNTER — Telehealth: Payer: Self-pay

## 2016-03-19 NOTE — Telephone Encounter (Signed)
At this time Patient is self pay and authorization is not necessary for CPT code [id number].

## 2016-03-19 NOTE — Telephone Encounter (Signed)
Patient has been advised of Surgery Date as well as Pre-Admission appointment date, time, and location.  Surgery Date: 03/21/16  Pre-admit Appointment: 03/20/16 at 1115am- Office  Patient has been advised to call [phone] the day before surgery between 1-3pm to obtain arrival time.

## 2016-03-20 ENCOUNTER — Ambulatory Visit: Payer: Self-pay

## 2016-03-20 ENCOUNTER — Other Ambulatory Visit: Payer: Self-pay

## 2016-03-20 ENCOUNTER — Encounter
Admission: RE | Admit: 2016-03-20 | Discharge: 2016-03-20 | Disposition: A | Discharge status: Home / Self Care | Payer: Self-pay | Source: Ambulatory Visit | Attending: Surgery | Admitting: Surgery

## 2016-03-20 DIAGNOSIS — Z8673 Personal history of transient ischemic attack (TIA), and cerebral infarction without residual deficits: Secondary | ICD-10-CM | POA: Insufficient documentation

## 2016-03-20 DIAGNOSIS — Z01818 Encounter for other preprocedural examination: Secondary | ICD-10-CM

## 2016-03-20 DIAGNOSIS — Z01812 Encounter for preprocedural laboratory examination: Secondary | ICD-10-CM | POA: Insufficient documentation

## 2016-03-20 HISTORY — DX: Tobacco use: Z72.0

## 2016-03-20 HISTORY — DX: Unspecified asthma, uncomplicated: J45.909

## 2016-03-20 HISTORY — DX: Depression, unspecified: F32.A

## 2016-03-20 HISTORY — DX: Anemia, unspecified: D64.9

## 2016-03-20 HISTORY — DX: Cerebral infarction, unspecified: I63.9

## 2016-03-20 HISTORY — DX: Schizophrenia, unspecified: F20.9

## 2016-03-20 HISTORY — DX: Bipolar disorder, unspecified: F31.9

## 2016-03-20 HISTORY — DX: Major depressive disorder, single episode, unspecified: F32.9

## 2016-03-20 HISTORY — DX: Dyspnea, unspecified: R06.00

## 2016-03-20 LAB — DIFFERENTIAL
BASOS PCT: 1 %
Basophils Absolute: 0 10*3/uL (ref 0–0.1)
Eosinophils Absolute: 0.1 10*3/uL (ref 0–0.7)
Eosinophils Relative: 1 %
LYMPHS ABS: 2.6 10*3/uL (ref 1.0–3.6)
Lymphocytes Relative: 37 %
MONO ABS: 0.5 10*3/uL (ref 0.2–0.9)
MONOS PCT: 7 %
NEUTROS ABS: 3.9 10*3/uL (ref 1.4–6.5)
Neutrophils Relative %: 54 %

## 2016-03-20 LAB — CBC
HEMATOCRIT: 42.7 % (ref 35.0–47.0)
HEMOGLOBIN: 14.3 g/dL (ref 12.0–16.0)
MCH: 31.9 pg (ref 26.0–34.0)
MCHC: 33.5 g/dL (ref 32.0–36.0)
MCV: 95 fL (ref 80.0–100.0)
Platelets: 194 10*3/uL (ref 150–440)
RBC: 4.49 MIL/uL (ref 3.80–5.20)
RDW: 13.2 % (ref 11.5–14.5)
WBC: 7.2 10*3/uL (ref 3.6–11.0)

## 2016-03-20 NOTE — Pre-Procedure Instructions (Signed)
Dr. Noralyn Pickarroll made aware of anesthesia consult, medical history reviewed with Dr. Noralyn Pickarroll .Urine Drug Screen ordered for day of surgery.

## 2016-03-20 NOTE — Patient Instructions (Signed)
  Your procedure is scheduled on: March 21, 2016 (Thursday) Report to Same Day Surgery 2nd floor medical mall [facility](Medical Mall Entrance-take elevator on left to 2nd floor.  Check in with surgery information desk.) ARRIVAL TIME 8:00 A. M. Remember: Instructions that are not followed completely may result in serious medical risk, up to and including death, or upon the discretion of your surgeon and anesthesiologist your surgery may need to be rescheduled.    _x___ 1. Do not eat food or drink liquids after midnight. No gum chewing or hard candies.     __x__ 2. No Alcohol for 24 hours before or after surgery.   __x__3. No Smoking for 24 prior to surgery.   ____  4. Bring all medications with you on the day of surgery if instructed.    __x__ 5. Notify your doctor if there is any change in your medical condition     (cold, fever, infections).     Do not wear jewelry, make-up, hairpins, clips or nail polish.  Do not wear lotions, powders, or perfumes. You may wear deodorant.  Do not shave 48 hours prior to surgery. Men may shave face and neck.  Do not bring valuables to the hospital.    [facility] is not responsible for any belongings or valuables.               Contacts, dentures or bridgework may not be worn into surgery.  Leave your suitcase in the car. After surgery it may be brought to your room.  For patients admitted to the hospital, discharge time is determined by your treatment team.   Patients discharged the day of surgery will not be allowed to drive home.  You will need someone to drive you home and stay with you the night of your procedure.    Please read over the following fact sheets that you were given:   [facility] Preparing for Surgery and or MRSA Information   _x___ Take these medicines the morning of surgery with A SIP OF WATER:    1. DIVALPROEX  2. PHENYTOIN  3.  4.  5.  6.  ____Fleets enema or Magnesium Citrate as directed.   _x___ Use CHG Soap or sage wipes  as directed on instruction sheet   ____ Use inhalers on the day of surgery and bring to hospital day of surgery  ____ Stop metformin 2 days prior to surgery    ____ Take 1/2 of usual insulin dose the night before surgery and none on the morning of           surgery.   __x__ Stop Aspirin, Coumadin, Pllavix ,Eliquis, Effient, or Pradaxa (NO ASPIRIN)  x__ Stop Anti-inflammatories such as Advil, Aleve, Ibuprofen, Motrin, Naproxen,          Naprosyn, Goodies powders or aspirin products. Ok to take Tylenol.   ____ Stop supplements until after surgery.    ____ Bring C-Pap to the hospital.

## 2016-03-21 ENCOUNTER — Encounter
Admission: RE | Disposition: A | Discharge status: Home / Self Care | Payer: Self-pay | Source: Ambulatory Visit | Attending: Surgery

## 2016-03-21 ENCOUNTER — Other Ambulatory Visit: Payer: Self-pay

## 2016-03-21 ENCOUNTER — Ambulatory Visit: Payer: Self-pay | Admitting: Anesthesiology

## 2016-03-21 ENCOUNTER — Other Ambulatory Visit: Payer: Self-pay | Admitting: Internal Medicine

## 2016-03-21 ENCOUNTER — Ambulatory Visit: Payer: Self-pay | Admitting: Surgery

## 2016-03-21 ENCOUNTER — Ambulatory Visit
Admission: RE | Admit: 2016-03-21 | Discharge: 2016-03-21 | Disposition: A | Discharge status: Home / Self Care | Payer: Self-pay | Source: Ambulatory Visit | Attending: Surgery | Admitting: Surgery

## 2016-03-21 DIAGNOSIS — Z539 Procedure and treatment not carried out, unspecified reason: Secondary | ICD-10-CM | POA: Insufficient documentation

## 2016-03-21 DIAGNOSIS — R52 Pain, unspecified: Secondary | ICD-10-CM

## 2016-03-21 LAB — URINE DRUG SCREEN, QUALITATIVE (ARMC ONLY)
AMPHETAMINES, UR SCREEN: NOT DETECTED
BENZODIAZEPINE, UR SCRN: NOT DETECTED
Barbiturates, Ur Screen: NOT DETECTED
Cannabinoid 50 Ng, Ur ~~LOC~~: NOT DETECTED
Cocaine Metabolite,Ur ~~LOC~~: POSITIVE — AB
MDMA (ECSTASY) UR SCREEN: NOT DETECTED
METHADONE SCREEN, URINE: NOT DETECTED
OPIATE, UR SCREEN: NOT DETECTED
Phencyclidine (PCP) Ur S: NOT DETECTED
Tricyclic, Ur Screen: POSITIVE — AB

## 2016-03-21 SURGERY — EXCISION MASS
Anesthesia: General | Laterality: Bilateral

## 2016-03-21 MED ORDER — CHLORHEXIDINE GLUCONATE CLOTH 2 % EX PADS: 6.0000 | MEDICATED_PAD | Freq: Once | CUTANEOUS | Status: DC

## 2016-03-21 MED ORDER — CEFAZOLIN SODIUM-DEXTROSE 2-4 GM/100ML-% IV SOLN: 2.0000 g | INTRAVENOUS | Status: DC

## 2016-03-21 MED ORDER — DEXAMETHASONE SODIUM PHOSPHATE 10 MG/ML IJ SOLN
INTRAMUSCULAR | Status: AC
  Filled 2016-03-21: qty 1

## 2016-03-21 MED ORDER — IBUPROFEN 800 MG PO TABS: 800.0000 mg | ORAL_TABLET | Freq: Three times a day (TID) | ORAL | 0 refills | Status: DC | PRN

## 2016-03-21 MED ORDER — MIDAZOLAM HCL 2 MG/2ML IJ SOLN
INTRAMUSCULAR | Status: AC
  Filled 2016-03-21: qty 2

## 2016-03-21 MED ORDER — ONDANSETRON HCL 4 MG/2ML IJ SOLN
INTRAMUSCULAR | Status: AC
  Filled 2016-03-21: qty 2

## 2016-03-21 MED ORDER — SODIUM CHLORIDE 0.9 % IJ SOLN
INTRAMUSCULAR | Status: AC
  Filled 2016-03-21: qty 10

## 2016-03-21 MED ORDER — FAMOTIDINE 20 MG PO TABS
ORAL_TABLET | ORAL | Status: AC
  Administered 2016-03-21: 20 mg via ORAL
  Filled 2016-03-21: qty 1

## 2016-03-21 MED ORDER — LIDOCAINE HCL (PF) 2 % IJ SOLN
INTRAMUSCULAR | Status: AC
  Filled 2016-03-21: qty 2

## 2016-03-21 MED ORDER — EPHEDRINE SULFATE 50 MG/ML IJ SOLN
INTRAMUSCULAR | Status: AC
  Filled 2016-03-21: qty 1

## 2016-03-21 MED ORDER — PHENYLEPHRINE HCL 10 MG/ML IJ SOLN
INTRAMUSCULAR | Status: AC
  Filled 2016-03-21: qty 1

## 2016-03-21 MED ORDER — LACTATED RINGERS IV SOLN: INTRAVENOUS | Status: DC

## 2016-03-21 MED ORDER — BUPIVACAINE-EPINEPHRINE (PF) 0.25% -1:200000 IJ SOLN
INTRAMUSCULAR | Status: AC
  Filled 2016-03-21: qty 30

## 2016-03-21 MED ORDER — CEFAZOLIN SODIUM-DEXTROSE 2-4 GM/100ML-% IV SOLN
INTRAVENOUS | Status: AC
  Filled 2016-03-21: qty 100

## 2016-03-21 MED ORDER — FENTANYL CITRATE (PF) 100 MCG/2ML IJ SOLN
INTRAMUSCULAR | Status: AC
  Filled 2016-03-21: qty 2

## 2016-03-21 MED ORDER — PROPOFOL 10 MG/ML IV BOLUS
INTRAVENOUS | Status: AC
  Filled 2016-03-21: qty 20

## 2016-03-21 MED ORDER — FAMOTIDINE 20 MG PO TABS
20.0000 mg | ORAL_TABLET | Freq: Once | ORAL | Status: AC
  Administered 2016-03-21: 20 mg via ORAL

## 2016-03-21 SURGICAL SUPPLY — 22 items
BLADE SURG 15 STRL LF DISP TIS (BLADE) ×1 IMPLANT
BLADE SURG 15 STRL SS (BLADE) ×2
CANISTER SUCT 1200ML W/VALVE (MISCELLANEOUS) ×3 IMPLANT
CHLORAPREP W/TINT 26ML (MISCELLANEOUS) ×3 IMPLANT
DRAPE LAPAROTOMY 100X77 ABD (DRAPES) IMPLANT
ELECT REM PT RETURN 9FT ADLT (ELECTROSURGICAL) ×3
ELECTRODE REM PT RTRN 9FT ADLT (ELECTROSURGICAL) ×1 IMPLANT
GLOVE BIO SURGEON STRL SZ7 (GLOVE) IMPLANT
GOWN STRL REUS W/ TWL LRG LVL3 (GOWN DISPOSABLE) ×2 IMPLANT
GOWN STRL REUS W/TWL LRG LVL3 (GOWN DISPOSABLE) ×4
LABEL OR SOLS (LABEL) IMPLANT
LIQUID BAND (GAUZE/BANDAGES/DRESSINGS) IMPLANT
NDL SAFETY 22GX1.5 (NEEDLE) IMPLANT
NS IRRIG 500ML POUR BTL (IV SOLUTION) ×3 IMPLANT
PACK BASIN MINOR ARMC (MISCELLANEOUS) IMPLANT
SPONGE LAP 18X18 5 PK (GAUZE/BANDAGES/DRESSINGS) IMPLANT
SUT MNCRL 4-0 (SUTURE)
SUT MNCRL 4-0 27XMFL (SUTURE)
SUT VIC AB 0 CT1 36 (SUTURE) IMPLANT
SUT VIC AB 2-0 CT2 27 (SUTURE) IMPLANT
SUTURE MNCRL 4-0 27XMF (SUTURE) IMPLANT
SYRINGE 10CC LL (SYRINGE) IMPLANT

## 2016-03-21 NOTE — H&P (View-Only) (Signed)
Outpatient Surgical Follow Up  03/18/2016  [name] is an 51 y.o. female.   Chief Complaint  Patient presents with  . Follow-up    Bilateral Breast Cyst- Discuss Mammogram and Surgery    HPI: 51 year old female returns to clinic for follow-up of bilateral superficial cyst of the superior aspect of her breasts. She was evaluated for this 2 weeks ago and sent for imaging studies. Since that time there in the left has continued to be tender in the area on the right continues to be intermittently tender. There were no signs of infection, drainage, changes in size or location. They continue to be palpable. Imaging studies did not show any suspicious findings. She reports no other changes. She denies any fevers, chills, nausea, vomiting, shortness of breath, diarrhea, constipation.  Past Medical History:  Diagnosis Date  . Allergy   . Cocaine abuse in remission   . Eczema   . Nervous   . Scoliosis   . Seizure (HCC)   . Seizures (HCC)     Past Surgical History:  Procedure Laterality Date  . APPENDECTOMY  2000  . BACK SURGERY  2012  . DILATION AND CURETTAGE OF UTERUS      Family History  Problem Relation Age of Onset  . Epilepsy Mother   . Seizures Mother   . Heart disease Father   . Epilepsy Maternal Grandmother   . Heart attack Maternal Grandmother     Social History:  reports that she has been smoking Cigarettes.  She has a 25.00 pack-year smoking history. She uses smokeless tobacco. She reports that she uses drugs, including Benzodiazepines and Cocaine. She reports that she does not drink alcohol.  Allergies:  Allergies  Allergen Reactions  . Flagyl [Metronidazole] Other (See Comments)    "my eyes go back in the back of my head"    Medications reviewed.    ROS A multipoint review of systems was completed, all pertinent positives and negatives are documented within the history of present illness the remainder are negative.   BP (!) 150/91   Pulse 98    Temp 99.4 F (37.4 C) (Oral)   Ht 4\' 11"  (1.499 m)   Wt 45.1 kg (99 lb 6.4 oz)   BMI 20.08 kg/m   Physical Exam Gen.: No acute distress Neck: Supple and nontender Lymph nodes: No evidence of cervical or clavicular lymphadenopathy Breasts: Bilateral breasts examined. No palpable Abnormalities within the breast proper. Palpable superficial cysts in the midclavicular line bilaterally Chest: Clear to auscultation Heart: Regular rhythm Abdomen: Soft and nontender    No results found for this or any previous visit (from the past 48 hour(s)). No results found.  Assessment/Plan:  1. Cyst of skin of breast, unspecified laterality 51 year old female with bilateral superficial skin cyst of the breast/chest wall. Patient is very anxious and nervous in clinic today. Due to her anxiety discussed the presents of proceeding with excisional biopsy of bilateral lesions with anesthesia on board. The surgical procedure was described in detail. Plan to proceed to the operating room for superficial cyst excision with my partner Dr. Everlene FarrierPabon later this week. All questions answered to the patient's satisfaction.  A total of 25 minutes was used for this encounter with greater than 25 minutes of a used for counseling and coordination of care.     Shelly Frameharles Daylen Hack, MD [facility] General Surgeon  03/18/2016,12:53 PM

## 2016-03-21 NOTE — OR Nursing (Signed)
Dr Maisie Fushomas in to see patient and discussed he canceled case bc positive drug screen.  Patient voices understanding and Dr Everlene FarrierPabon notified and wants patient to contact office to reschedule.

## 2016-03-21 NOTE — Interval H&P Note (Signed)
History and Physical Interval Note:  03/21/2016 8:19 AM  [name]  has presented today for surgery, with the diagnosis of bilateral skin cyst of breast  The various methods of treatment have been discussed with the patient and family. After consideration of risks ( including but not limited to infection, bleeding,persistent of symptoms, chronic pain) benefits and other options for treatment, the patient has consented to  Procedure(s): EXCISION MASS REMOVAL OF FOREIGN BODY (Bilateral) as a surgical intervention .  The patient's history has been reviewed, patient examined, no change in status, stable for surgery.  I have reviewed the patient's chart and labs.  Questions were answered to the patient's satisfaction.     Norris Brumbach F [name]

## 2016-03-21 NOTE — Anesthesia Preprocedure Evaluation (Deleted)
Anesthesia Evaluation  Patient identified by MRN, date of birth, ID band Patient awake    Reviewed: Allergy & Precautions, NPO status , Patient's Chart, lab work & pertinent test results, reviewed documented beta blocker date and time   Airway Mallampati: II  TM Distance: >3 FB     Dental  (+) Chipped   Pulmonary shortness of breath, asthma , Current Smoker,           Cardiovascular      Neuro/Psych Seizures -,  PSYCHIATRIC DISORDERS Anxiety Depression Bipolar Disorder Schizophrenia CVA    GI/Hepatic   Endo/Other    Renal/GU      Musculoskeletal   Abdominal   Peds  Hematology  (+) anemia ,   Anesthesia Other Findings Cocaine use. Hypertensive this am.  Reproductive/Obstetrics                             Anesthesia Physical Anesthesia Plan  ASA: III  Anesthesia Plan: General   Post-op Pain Management:    Induction: Intravenous  Airway Management Planned: LMA  Additional Equipment:   Intra-op Plan:   Post-operative Plan:   Informed Consent: I have reviewed the patients History and Physical, chart, labs and discussed the procedure including the risks, benefits and alternatives for the proposed anesthesia with the patient or authorized representative who has indicated his/her understanding and acceptance.     Plan Discussed with: CRNA  Anesthesia Plan Comments:         Anesthesia Quick Evaluation

## 2016-03-25 ENCOUNTER — Telehealth: Payer: Self-pay

## 2016-03-25 NOTE — Telephone Encounter (Signed)
Patient will need to have Urine Drug Screen prior to rescheduling surgery. Call made to patient at this time. No answer. Left voicemail for return phone call.

## 2016-03-25 NOTE — Telephone Encounter (Signed)
Patient is returning a nurses call. I let her know that she needs to go over to the medical mall to get her urine retested before we can reschedule her surgery. Patient understood.

## 2016-03-26 ENCOUNTER — Other Ambulatory Visit: Payer: Self-pay

## 2016-03-26 DIAGNOSIS — Z01818 Encounter for other preprocedural examination: Secondary | ICD-10-CM

## 2016-03-27 ENCOUNTER — Other Ambulatory Visit
Admission: RE | Admit: 2016-03-27 | Discharge: 2016-03-27 | Disposition: A | Discharge status: Home / Self Care | Payer: Self-pay | Source: Ambulatory Visit | Attending: General Surgery | Admitting: General Surgery

## 2016-03-27 ENCOUNTER — Telehealth: Payer: Self-pay

## 2016-03-27 DIAGNOSIS — Z01818 Encounter for other preprocedural examination: Secondary | ICD-10-CM | POA: Insufficient documentation

## 2016-03-27 LAB — URINE DRUG SCREEN, QUALITATIVE (ARMC ONLY)
Amphetamines, Ur Screen: NOT DETECTED
BARBITURATES, UR SCREEN: NOT DETECTED
BENZODIAZEPINE, UR SCRN: NOT DETECTED
CANNABINOID 50 NG, UR ~~LOC~~: NOT DETECTED
Cocaine Metabolite,Ur ~~LOC~~: NOT DETECTED
MDMA (Ecstasy)Ur Screen: NOT DETECTED
Methadone Scn, Ur: NOT DETECTED
Opiate, Ur Screen: NOT DETECTED
Phencyclidine (PCP) Ur S: NOT DETECTED
TRICYCLIC, UR SCREEN: NOT DETECTED

## 2016-03-27 NOTE — Telephone Encounter (Signed)
Patient would like for Amber to return her call. [phone]

## 2016-03-28 NOTE — Telephone Encounter (Signed)
Patient is calling because she would like to know when her surgery will be scheduled. Please call patient and advice

## 2016-03-29 ENCOUNTER — Telehealth: Payer: Self-pay

## 2016-03-29 NOTE — Telephone Encounter (Signed)
Prospective surgery date has been sent to the Operating Room to be posted. I will call patient when information is available.

## 2016-03-29 NOTE — Telephone Encounter (Signed)
[name] is calling again to find out when her surgery will be scheduled. I have explained to her that [name] is working on it and that she will call her back as soon as she gets her surgery scheduled. Patient states that she understands but continues to call back multiple times (4-5 times) a day asking the same question.

## 2016-03-29 NOTE — Telephone Encounter (Signed)
I have spoken multiple times with this patient and have explained that I will speak with Dr. Tonita CongWoodham and she will be rescheduled for surgery. It has also been explained that she will need to have another drug test the morning of surgery and if that is negative she will be cancelled and not rescheduled. The patient verbalizes understanding of this information.  I will return phone call to patient as soon as I have been notified of when to reschedule surgery by Dr. Tonita CongWoodham.

## 2016-04-01 ENCOUNTER — Telehealth: Payer: Self-pay

## 2016-04-01 NOTE — Telephone Encounter (Signed)
Patient's surgery has been rescheduled. I have contacted patient at this time, however, there was no answer and I was unable to leave a voicemail as this has not been set-up. Will try to call once again later.    Patient will need to be advised of Surgery Date as well as Pre-Admission appointment date, time, and location.  Surgery Date: 04/10/16  Pre-admit Appointment: 04/03/16 from 1p-5p  Patient needs to be notified to call [phone] the day before surgery between 1-3pm to obtain arrival time.  Patient also needs to be advised that she will be required to have a drug test once again the morning of surgery and if she does not pass this, her surgery will be cancelled and it will NOT be rescheduled.

## 2016-04-01 NOTE — Telephone Encounter (Signed)
No authorization is needed, no insurance listed, patient self pay for CPT code [id number].

## 2016-04-02 ENCOUNTER — Telehealth: Payer: Self-pay

## 2016-04-02 NOTE — Telephone Encounter (Signed)
Shelly BeckDoretha is returning Amber's phone call. I gave her all of her surgery information. Patient understood.

## 2016-04-03 ENCOUNTER — Other Ambulatory Visit: Payer: Self-pay

## 2016-04-03 ENCOUNTER — Encounter
Admission: RE | Admit: 2016-04-03 | Discharge: 2016-04-03 | Disposition: A | Discharge status: Home / Self Care | Payer: Self-pay | Source: Ambulatory Visit | Attending: General Surgery | Admitting: General Surgery

## 2016-04-03 DIAGNOSIS — Z01818 Encounter for other preprocedural examination: Secondary | ICD-10-CM

## 2016-04-03 NOTE — Patient Instructions (Signed)
  Your procedure is scheduled on: 04-10-16 Report to Same Day Surgery 2nd floor medical mall [facility](Medical Mall Entrance-take elevator on left to 2nd floor.  Check in with surgery information desk.) To find out your arrival time please call [phone] between 1PM - 3PM on 04-09-16  Remember: Instructions that are not followed completely may result in serious medical risk, up to and including death, or upon the discretion of your surgeon and anesthesiologist your surgery may need to be rescheduled.    _x___ 1. Do not eat food or drink liquids after midnight. No gum chewing or hard candies.     __x__ 2. No Alcohol for 24 hours before or after surgery.   __x__3. No Smoking for 24 prior to surgery.   ____  4. Bring all medications with you on the day of surgery if instructed.    __x__ 5. Notify your doctor if there is any change in your medical condition     (cold, fever, infections).     Do not wear jewelry, make-up, hairpins, clips or nail polish.  Do not wear lotions, powders, or perfumes. You may wear deodorant.  Do not shave 48 hours prior to surgery. Men may shave face and neck.  Do not bring valuables to the hospital.    [facility] is not responsible for any belongings or valuables.               Contacts, dentures or bridgework may not be worn into surgery.  Leave your suitcase in the car. After surgery it may be brought to your room.  For patients admitted to the hospital, discharge time is determined by your treatment team.   Patients discharged the day of surgery will not be allowed to drive home.  You will need someone to drive you home and stay with you the night of your procedure.    Please read over the following fact sheets that you were given:   [facility] Preparing for Surgery and or MRSA Information   _x___ Take these medicines the morning of surgery with A SIP OF WATER:    1. DILANTIN  2. DEPAKOTE  3.  4.  5.  6.  ____Fleets enema or Magnesium Citrate as  directed.   ____ Use CHG Soap or sage wipes as directed on instruction sheet   ____ Use inhalers on the day of surgery and bring to hospital day of surgery  ____ Stop metformin 2 days prior to surgery    ____ Take 1/2 of usual insulin dose the night before surgery and none on the morning of  surgery.   ____ Stop Aspirin, Coumadin, Pllavix ,Eliquis, Effient, or Pradaxa  x__ Stop Anti-inflammatories such as Advil, Aleve, Ibuprofen, Motrin, Naproxen,          Naprosyn, Goodies powders or aspirin products NOW-Ok to take Tylenol.   ____ Stop supplements until after surgery.    ____ Bring C-Pap to the hospital.

## 2016-04-08 ENCOUNTER — Telehealth: Payer: Self-pay

## 2016-04-08 NOTE — Telephone Encounter (Signed)
Patient calls in and states that she used Cocaine over the weekend and then had a seizure last night and is not feeling well today. I explained to patient that I will cancel her surgery and then Dr. Tonita CongWoodham will see her back in office when she is ready to reschedule and clean from cocaine.  She verbalizes understanding.

## 2016-04-10 ENCOUNTER — Encounter: Admission: RE | Payer: Self-pay | Source: Ambulatory Visit

## 2016-04-10 ENCOUNTER — Ambulatory Visit: Admission: RE | Admit: 2016-04-10 | Payer: Self-pay | Source: Ambulatory Visit | Admitting: General Surgery

## 2016-04-10 SURGERY — EXCISION OF BREAST LESION
Anesthesia: Choice | Laterality: Bilateral

## 2016-04-22 ENCOUNTER — Ambulatory Visit: Payer: Self-pay | Admitting: General Surgery

## 2016-04-25 ENCOUNTER — Other Ambulatory Visit: Payer: Self-pay | Admitting: Adult Health Nurse Practitioner

## 2016-04-25 ENCOUNTER — Other Ambulatory Visit: Payer: Self-pay | Admitting: Internal Medicine

## 2016-04-25 DIAGNOSIS — Z0000 Encounter for general adult medical examination without abnormal findings: Secondary | ICD-10-CM

## 2016-04-25 DIAGNOSIS — R52 Pain, unspecified: Secondary | ICD-10-CM

## 2016-04-29 ENCOUNTER — Other Ambulatory Visit: Payer: Self-pay | Admitting: Internal Medicine

## 2016-04-29 DIAGNOSIS — R52 Pain, unspecified: Secondary | ICD-10-CM

## 2016-04-29 DIAGNOSIS — Z0000 Encounter for general adult medical examination without abnormal findings: Secondary | ICD-10-CM

## 2016-05-15 ENCOUNTER — Encounter: Payer: Self-pay | Admitting: General Surgery

## 2016-05-15 ENCOUNTER — Ambulatory Visit (INDEPENDENT_AMBULATORY_CARE_PROVIDER_SITE_OTHER): Payer: Self-pay | Admitting: General Surgery

## 2016-05-15 VITALS — BP 138/79 | HR 108 | Temp 98.3°F | Wt 96.0 lb

## 2016-05-15 DIAGNOSIS — N6082 Other benign mammary dysplasias of left breast: Secondary | ICD-10-CM

## 2016-05-15 DIAGNOSIS — N6081 Other benign mammary dysplasias of right breast: Secondary | ICD-10-CM

## 2016-05-15 DIAGNOSIS — N6089 Other benign mammary dysplasias of unspecified breast: Secondary | ICD-10-CM

## 2016-05-15 NOTE — Progress Notes (Signed)
Outpatient Surgical Follow Up  05/15/2016  [name] is an 51 y.o. female.   Chief Complaint  Patient presents with  . Follow-up    Bilateral Cyst of skin of breast    HPI: 51 year old female returns to clinic for follow-up for bilateral superficial cyst of the superior aspects of her breasts. She has been scheduled for surgical removal of numerous times in the past several months. Each procedure has been canceled secondary to her cocaine usage. She states that the area is continuing to be tender. The left side is more tender than the right. There has been no signs of infection, drainage or changes in size or location. There are palpable and she still desires removal. She denies any fevers, chills, nausea, vomiting, chest pain, shortness of breath, diarrhea, constipation.  Past Medical History:  Diagnosis Date  . Allergy   . Anemia   . Anxiety   . Asthma    NO INHALERS  . Bipolar disorder (HCC)   . Cocaine abuse in remission   . Depression   . Dyspnea   . Eczema   . Nervous   . Schizophrenia (HCC)   . Scoliosis   . Seizure (HCC)   . Seizures (HCC)    Last seizure, March 09, 2016  . Stroke [facility]) 2007  . Tobacco use     Past Surgical History:  Procedure Laterality Date  . APPENDECTOMY  2000  . BACK SURGERY  2012  . DILATION AND CURETTAGE OF UTERUS      Family History  Problem Relation Age of Onset  . Epilepsy Mother   . Seizures Mother   . Heart disease Father   . Epilepsy Maternal Grandmother   . Heart attack Maternal Grandmother     Social History:  reports that she has been smoking Cigarettes.  She has a 12.50 pack-year smoking history. She has never used smokeless tobacco. She reports that she uses drugs, including Benzodiazepines and Cocaine. She reports that she does not drink alcohol.  Allergies:  Allergies  Allergen Reactions  . Flagyl [Metronidazole] Other (See Comments)    "my eyes go back in the back of my head"    Medications  reviewed.    ROS A multipoint review of systems was completed, all pertinent positives and negatives are documented within the history of present illness and remainder are negative   BP 138/79   Pulse (!) 108   Temp 98.3 F (36.8 C) (Oral)   Wt 43.5 kg (96 lb)   BMI 19.39 kg/m   Physical Exam Gen.: No acute distress Neck: Supple and nontender Lymph nodes: No evidence of cervical or clavicular lymphadenopathy Breasts: Bilateral breasts examined. No palpable Abnormalities within the breast proper. Palpable superficial cysts in the midclavicular line bilaterally Chest: Clear to auscultation Heart: Regular rhythm Abdomen: Soft and nontender    No results found for this or any previous visit (from the past 48 hour(s)). No results found.  Assessment/Plan:  1. Cyst of skin of breast, unspecified laterality 51 year old female with superficial cysts of bilateral breasts. Patient continues to have issues of anxiety. Counseled her extensively about the importance of abstaining from cocaine usage. She voices understanding. The procedure of the surgical excision was again discussed. She voiced understanding and desires to proceed. Plan to proceed in 2 weeks on April 25.  Total of 25 minutes was used on this encounter with greater than 50% of it used for counseling and coordination of care.     Ricarda Frame, MD  F[facility] General Surgeon  05/15/2016,2:34 PM

## 2016-05-15 NOTE — Patient Instructions (Signed)
Please look at your blue sheet in case you have any questions about your surgery. 

## 2016-05-16 ENCOUNTER — Telehealth: Payer: Self-pay

## 2016-05-16 NOTE — Telephone Encounter (Signed)
Received PAP application from [facility] for Nicotrol Inhaler placed for provider to sign.

## 2016-05-17 ENCOUNTER — Telehealth: Payer: Self-pay

## 2016-05-17 NOTE — Telephone Encounter (Signed)
I have updated the patients phone number. The information below has been reviewed with the patient. Patient understood and had no further questions

## 2016-05-17 NOTE — Telephone Encounter (Signed)
Called patient to give surgery information. No answer. Received a recording stating that patient was unavailable and to try again later. Will try back at a later time.  Information below needs to be relayed to patient:  Surgery Date: 05/29/16  Pre-admit Appointment: 05/22/16 from 9a-1p (Phone)  Patient needs to call [phone] the day before surgery between 1-3pm to obtain arrival time.

## 2016-05-20 ENCOUNTER — Telehealth: Payer: Self-pay

## 2016-05-20 NOTE — Telephone Encounter (Signed)
Patient is self pay for CPT code 21555,ICD 10 code N60.89.

## 2016-05-22 ENCOUNTER — Telehealth: Payer: Self-pay

## 2016-05-22 ENCOUNTER — Encounter
Admission: RE | Admit: 2016-05-22 | Discharge: 2016-05-22 | Disposition: A | Discharge status: Home / Self Care | Payer: Self-pay | Source: Ambulatory Visit | Attending: General Surgery | Admitting: General Surgery

## 2016-05-22 ENCOUNTER — Other Ambulatory Visit: Payer: Self-pay

## 2016-05-22 DIAGNOSIS — Z0000 Encounter for general adult medical examination without abnormal findings: Secondary | ICD-10-CM

## 2016-05-22 NOTE — Pre-Procedure Instructions (Signed)
SPOKE WITH DR Noralyn Pick REGARDING PTS UPCOMING SURGERY ON 05-29-16. PT STATED DURING PHONE INTERVIEW TODAY THAT SHE LAST HAD A SEIZURE ON 05-20-16. PT WAS ALONE AND UNSURE THE LENGTH OF HER SEIZURE.  SHE DID SCRAPE HER NOSE AND BIT HER LIP.  PTS SURGERY HAS BEEN CANCELLED MULTIPLE TIMES DUE TO POSITIVE UDS FOR COCAINE-  DR Noralyn Pick SAID THAT PT SHOULD BE FINE TO PROCEED WITH SURGERY BUT TO STRESS TO PT THE NEED TO REFRAIN FROM COCAINE.  PT IS COMING IN ON 05-27-16 FOR UDS PER DR WOODHAM AND ANOTHER UDS WILL BE DONE DAY OF SURGERY

## 2016-05-22 NOTE — Patient Instructions (Signed)
  Your procedure is scheduled on: 05-29-16 (Wednesday) Report to Same Day Surgery 2nd floor medical mall [facility] Entrance-take elevator on left to 2nd floor.  Check in with surgery information desk.) To find out your arrival time please call [phone] between 1PM - 3PM on 05-28-16 (Tuesday)  Remember: Instructions that are not followed completely may result in serious medical risk, up to and including death, or upon the discretion of your surgeon and anesthesiologist your surgery may need to be rescheduled.    _x___ 1. Do not eat food or drink liquids after midnight. No gum chewing or hard candies.     __x__ 2. No Alcohol for 24 hours before or after surgery.   __x__3. No Smoking for 24 prior to surgery.   ____  4. Bring all medications with you on the day of surgery if instructed.    __x__ 5. Notify your doctor if there is any change in your medical condition     (cold, fever, infections).     Do not wear jewelry, make-up, hairpins, clips or nail polish.  Do not wear lotions, powders, or perfumes. You may wear deodorant.  Do not shave 48 hours prior to surgery. Men may shave face and neck.  Do not bring valuables to the hospital.    [facility] is not responsible for any belongings or valuables.               Contacts, dentures or bridgework may not be worn into surgery.  Leave your suitcase in the car. After surgery it may be brought to your room.  For patients admitted to the hospital, discharge time is determined by your treatment team.   Patients discharged the day of surgery will not be allowed to drive home.  You will need someone to drive you home and stay with you the night of your procedure.    Please read over the following fact sheets that you were given:     _x___ Take anti-hypertensive (unless it includes a diuretic), cardiac, seizure, asthma,     anti-reflux and psychiatric medicines. These include:  1. DILANTIN  2.DEPAKOTE  3.  4.  5.  6.  ____Fleets  enema or Magnesium Citrate as directed.   ____ Use CHG Soap or sage wipes as directed on instruction sheet   ____ Use inhalers on the day of surgery and bring to hospital day of surgery  ____ Stop Metformin and Janumet 2 days prior to surgery.    ____ Take 1/2 of usual insulin dose the night before surgery and none on the morning     surgery.   ____ Follow recommendations from Cardiologist, Pulmonologist or PCP regarding stopping Aspirin, Coumadin, Pllavix ,Eliquis, Effient, or Pradaxa, and Pletal.  X____Stop Anti-inflammatories such as Advil, Aleve, IBUPROFEN, Motrin, Naproxen, Naprosyn, Goodies powders or aspirin products NOW-OK to take Tylenol    ____ Stop supplements until after surgery.     ____ Bring C-Pap to the hospital.

## 2016-05-22 NOTE — Telephone Encounter (Signed)
I spoke with Amber, She is fine with having a drug test performed the day before surgery at pre admit and the day of her surgery.   I called Heather and left her a voicemail on her secured line.

## 2016-05-22 NOTE — Telephone Encounter (Signed)
Heather from pre admit called regarding the patient. [name] would like to know if Kamorie needs to come into pre-admit before her surgery to have another drug screen or if she is going to just have it done on the day of her surgery. You can call Heather at [phone].

## 2016-05-24 LAB — COMPREHENSIVE METABOLIC PANEL
ALBUMIN: 4.7 g/dL (ref 3.5–5.5)
ALK PHOS: 84 IU/L (ref 39–117)
ALT: 20 IU/L (ref 0–32)
AST: 34 IU/L (ref 0–40)
Albumin/Globulin Ratio: 1.9 (ref 1.2–2.2)
BUN / CREAT RATIO: 9 (ref 9–23)
BUN: 6 mg/dL (ref 6–24)
Bilirubin Total: 0.4 mg/dL (ref 0.0–1.2)
CALCIUM: 10 mg/dL (ref 8.7–10.2)
CO2: 22 mmol/L (ref 18–29)
CREATININE: 0.7 mg/dL (ref 0.57–1.00)
Chloride: 100 mmol/L (ref 96–106)
GFR, EST AFRICAN AMERICAN: 115 mL/min/{1.73_m2} (ref >59)
GFR, EST NON AFRICAN AMERICAN: 100 mL/min/{1.73_m2} (ref >59)
GLOBULIN, TOTAL: 2.5 g/dL (ref 1.5–4.5)
GLUCOSE: 73 mg/dL (ref 65–99)
Potassium: 3.9 mmol/L (ref 3.5–5.2)
SODIUM: 140 mmol/L (ref 134–144)
TOTAL PROTEIN: 7.2 g/dL (ref 6.0–8.5)

## 2016-05-24 LAB — CBC WITH DIFFERENTIAL
BASOS: 0 %
Basophils Absolute: 0 10*3/uL (ref 0.0–0.2)
EOS (ABSOLUTE): 0.1 10*3/uL (ref 0.0–0.4)
EOS: 1 %
HEMATOCRIT: 40.3 % (ref 34.0–46.6)
Hemoglobin: 13.5 g/dL (ref 11.1–15.9)
Immature Grans (Abs): 0 10*3/uL (ref 0.0–0.1)
Immature Granulocytes: 0 %
LYMPHS ABS: 2.3 10*3/uL (ref 0.7–3.1)
Lymphs: 43 %
MCH: 31.5 pg (ref 26.6–33.0)
MCHC: 33.5 g/dL (ref 31.5–35.7)
MCV: 94 fL (ref 79–97)
MONOS ABS: 0.3 10*3/uL (ref 0.1–0.9)
Monocytes: 5 %
NEUTROS PCT: 51 %
Neutrophils Absolute: 2.8 10*3/uL (ref 1.4–7.0)
RBC: 4.29 x10E6/uL (ref 3.77–5.28)
RDW: 13.5 % (ref 12.3–15.4)
WBC: 5.5 10*3/uL (ref 3.4–10.8)

## 2016-05-24 LAB — PHENYTOIN LEVEL, FREE AND TOTAL
Phenytoin, Free: NOT DETECTED ug/mL (ref 1.0–2.0)
Phenytoin, Serum: <0.8 ug/mL — ABNORMAL LOW (ref 10.0–20.0)

## 2016-05-27 ENCOUNTER — Inpatient Hospital Stay: Admission: RE | Admit: 2016-05-27 | Payer: Self-pay | Source: Ambulatory Visit

## 2016-05-27 ENCOUNTER — Telehealth: Payer: Self-pay

## 2016-05-27 NOTE — Telephone Encounter (Signed)
Patient called and left a voicemail. She would like to speak to [facility] about her upcoming surgery. Please call patient and advice.

## 2016-05-27 NOTE — Telephone Encounter (Signed)
Called patient back and I was told by her fiance Billey Gosling) that she was taking a shower so to call her back in 30 minutes. However, I told Billey Gosling that I will call her back tomorrow morning.  Before I was done documenting her note, patient called me back.  Patient wanted to know at what time she needed to be at Pre-Admit testing. I told her that [facility] [address] at 8:00 AM. Patient understood. Then she wanted to know at what time she needed to be at the [facility] for her procedure. I told her that she needed to look at her blue sheet and there is a number that she will need to call between 1-3 PM. Patient understood and had no further questions.

## 2016-05-28 ENCOUNTER — Encounter
Admission: RE | Admit: 2016-05-28 | Discharge: 2016-05-28 | Disposition: A | Discharge status: Home / Self Care | Payer: Self-pay | Source: Ambulatory Visit | Attending: General Surgery | Admitting: General Surgery

## 2016-05-28 DIAGNOSIS — Z01812 Encounter for preprocedural laboratory examination: Secondary | ICD-10-CM | POA: Insufficient documentation

## 2016-05-28 LAB — URINE DRUG SCREEN, QUALITATIVE (ARMC ONLY)
AMPHETAMINES, UR SCREEN: NOT DETECTED
BARBITURATES, UR SCREEN: NOT DETECTED
Benzodiazepine, Ur Scrn: NOT DETECTED
COCAINE METABOLITE, UR ~~LOC~~: NOT DETECTED
Cannabinoid 50 Ng, Ur ~~LOC~~: NOT DETECTED
MDMA (Ecstasy)Ur Screen: NOT DETECTED
METHADONE SCREEN, URINE: NOT DETECTED
OPIATE, UR SCREEN: NOT DETECTED
Phencyclidine (PCP) Ur S: NOT DETECTED
TRICYCLIC, UR SCREEN: NOT DETECTED

## 2016-05-28 MED ORDER — FAMOTIDINE 20 MG PO TABS
20.0000 mg | ORAL_TABLET | Freq: Once | ORAL | Status: AC
  Administered 2016-05-29: 20 mg via ORAL

## 2016-05-29 ENCOUNTER — Ambulatory Visit: Payer: Self-pay | Admitting: Certified Registered"

## 2016-05-29 ENCOUNTER — Telehealth: Payer: Self-pay

## 2016-05-29 ENCOUNTER — Ambulatory Visit
Admission: RE | Admit: 2016-05-29 | Discharge: 2016-05-29 | Disposition: A | Discharge status: Home / Self Care | Payer: Self-pay | Source: Ambulatory Visit | Attending: General Surgery | Admitting: General Surgery

## 2016-05-29 ENCOUNTER — Ambulatory Visit: Payer: Self-pay | Admitting: Internal Medicine

## 2016-05-29 ENCOUNTER — Encounter: Payer: Self-pay | Admitting: *Deleted

## 2016-05-29 ENCOUNTER — Encounter
Admission: RE | Disposition: A | Discharge status: Home / Self Care | Payer: Self-pay | Source: Ambulatory Visit | Attending: General Surgery

## 2016-05-29 DIAGNOSIS — R569 Unspecified convulsions: Secondary | ICD-10-CM | POA: Insufficient documentation

## 2016-05-29 DIAGNOSIS — F419 Anxiety disorder, unspecified: Secondary | ICD-10-CM | POA: Insufficient documentation

## 2016-05-29 DIAGNOSIS — Z888 Allergy status to other drugs, medicaments and biological substances status: Secondary | ICD-10-CM | POA: Insufficient documentation

## 2016-05-29 DIAGNOSIS — Z820 Family history of epilepsy and other diseases of the nervous system: Secondary | ICD-10-CM | POA: Insufficient documentation

## 2016-05-29 DIAGNOSIS — L720 Epidermal cyst: Secondary | ICD-10-CM

## 2016-05-29 DIAGNOSIS — F17210 Nicotine dependence, cigarettes, uncomplicated: Secondary | ICD-10-CM | POA: Insufficient documentation

## 2016-05-29 DIAGNOSIS — R0600 Dyspnea, unspecified: Secondary | ICD-10-CM | POA: Insufficient documentation

## 2016-05-29 DIAGNOSIS — F319 Bipolar disorder, unspecified: Secondary | ICD-10-CM | POA: Insufficient documentation

## 2016-05-29 DIAGNOSIS — M419 Scoliosis, unspecified: Secondary | ICD-10-CM | POA: Insufficient documentation

## 2016-05-29 DIAGNOSIS — J45909 Unspecified asthma, uncomplicated: Secondary | ICD-10-CM | POA: Insufficient documentation

## 2016-05-29 DIAGNOSIS — L309 Dermatitis, unspecified: Secondary | ICD-10-CM | POA: Insufficient documentation

## 2016-05-29 DIAGNOSIS — N6002 Solitary cyst of left breast: Secondary | ICD-10-CM | POA: Insufficient documentation

## 2016-05-29 DIAGNOSIS — F209 Schizophrenia, unspecified: Secondary | ICD-10-CM | POA: Insufficient documentation

## 2016-05-29 DIAGNOSIS — N6001 Solitary cyst of right breast: Secondary | ICD-10-CM | POA: Insufficient documentation

## 2016-05-29 DIAGNOSIS — Z8249 Family history of ischemic heart disease and other diseases of the circulatory system: Secondary | ICD-10-CM | POA: Insufficient documentation

## 2016-05-29 DIAGNOSIS — F1411 Cocaine abuse, in remission: Secondary | ICD-10-CM | POA: Insufficient documentation

## 2016-05-29 DIAGNOSIS — Z8673 Personal history of transient ischemic attack (TIA), and cerebral infarction without residual deficits: Secondary | ICD-10-CM | POA: Insufficient documentation

## 2016-05-29 DIAGNOSIS — D649 Anemia, unspecified: Secondary | ICD-10-CM | POA: Insufficient documentation

## 2016-05-29 DIAGNOSIS — R222 Localized swelling, mass and lump, trunk: Secondary | ICD-10-CM

## 2016-05-29 HISTORY — PX: BREAST CYST EXCISION: SHX579

## 2016-05-29 LAB — URINE DRUG SCREEN, QUALITATIVE (ARMC ONLY)
AMPHETAMINES, UR SCREEN: NOT DETECTED
BARBITURATES, UR SCREEN: NOT DETECTED
BENZODIAZEPINE, UR SCRN: NOT DETECTED
Cannabinoid 50 Ng, Ur ~~LOC~~: NOT DETECTED
Cocaine Metabolite,Ur ~~LOC~~: NOT DETECTED
MDMA (Ecstasy)Ur Screen: NOT DETECTED
METHADONE SCREEN, URINE: NOT DETECTED
OPIATE, UR SCREEN: NOT DETECTED
Phencyclidine (PCP) Ur S: NOT DETECTED
TRICYCLIC, UR SCREEN: NOT DETECTED

## 2016-05-29 SURGERY — EXCISION, CYST, BREAST
Anesthesia: General | Laterality: Bilateral | Wound class: Clean

## 2016-05-29 MED ORDER — LIDOCAINE-EPINEPHRINE (PF) 1 %-1:200000 IJ SOLN
INTRAMUSCULAR | Status: AC
  Filled 2016-05-29: qty 30

## 2016-05-29 MED ORDER — ONDANSETRON HCL 4 MG/2ML IJ SOLN: 4.0000 mg | Freq: Once | INTRAMUSCULAR | Status: DC | PRN

## 2016-05-29 MED ORDER — ONDANSETRON HCL 4 MG/2ML IJ SOLN
INTRAMUSCULAR | Status: AC
  Filled 2016-05-29: qty 2

## 2016-05-29 MED ORDER — PROPOFOL 10 MG/ML IV BOLUS
INTRAVENOUS | Status: AC
  Filled 2016-05-29: qty 20

## 2016-05-29 MED ORDER — MIDAZOLAM HCL 5 MG/5ML IJ SOLN
INTRAMUSCULAR | Status: DC | PRN
  Administered 2016-05-29 (×2): 1 mg via INTRAVENOUS

## 2016-05-29 MED ORDER — ONDANSETRON HCL 4 MG/2ML IJ SOLN
INTRAMUSCULAR | Status: DC | PRN
Start: 2016-05-29 — End: 2016-05-29
  Administered 2016-05-29: 4 mg via INTRAVENOUS

## 2016-05-29 MED ORDER — CHLORHEXIDINE GLUCONATE CLOTH 2 % EX PADS
6.0000 | MEDICATED_PAD | Freq: Once | CUTANEOUS | Status: DC
Start: 2016-05-29 — End: 2016-05-29

## 2016-05-29 MED ORDER — FENTANYL CITRATE (PF) 100 MCG/2ML IJ SOLN: 25.0000 ug | INTRAMUSCULAR | Status: DC | PRN

## 2016-05-29 MED ORDER — LIDOCAINE HCL (PF) 2 % IJ SOLN
INTRAMUSCULAR | Status: AC
  Filled 2016-05-29: qty 2

## 2016-05-29 MED ORDER — BUPIVACAINE HCL (PF) 0.5 % IJ SOLN
INTRAMUSCULAR | Status: AC
  Filled 2016-05-29: qty 30

## 2016-05-29 MED ORDER — LACTATED RINGERS IV SOLN
INTRAVENOUS | Status: DC
  Administered 2016-05-29: 12:00:00 via INTRAVENOUS

## 2016-05-29 MED ORDER — FENTANYL CITRATE (PF) 100 MCG/2ML IJ SOLN
INTRAMUSCULAR | Status: DC | PRN
  Administered 2016-05-29 (×2): 50 ug via INTRAVENOUS

## 2016-05-29 MED ORDER — LIDOCAINE 2% (20 MG/ML) 5 ML SYRINGE
INTRAMUSCULAR | Status: DC | PRN
  Administered 2016-05-29: 50 mg via INTRAVENOUS

## 2016-05-29 MED ORDER — BUPIVACAINE HCL (PF) 0.5 % IJ SOLN
INTRAMUSCULAR | Status: DC | PRN
  Administered 2016-05-29: 2.5 mL

## 2016-05-29 MED ORDER — MIDAZOLAM HCL 2 MG/2ML IJ SOLN
INTRAMUSCULAR | Status: AC
  Filled 2016-05-29: qty 2

## 2016-05-29 MED ORDER — LIDOCAINE-EPINEPHRINE (PF) 1 %-1:200000 IJ SOLN
INTRAMUSCULAR | Status: DC | PRN
  Administered 2016-05-29: 2.5 mL

## 2016-05-29 MED ORDER — GLYCOPYRROLATE 0.2 MG/ML IJ SOLN
INTRAMUSCULAR | Status: AC
  Filled 2016-05-29: qty 1

## 2016-05-29 MED ORDER — GLYCOPYRROLATE 0.2 MG/ML IJ SOLN
INTRAMUSCULAR | Status: DC | PRN
  Administered 2016-05-29: 0.2 mg via INTRAVENOUS

## 2016-05-29 MED ORDER — FAMOTIDINE 20 MG PO TABS
ORAL_TABLET | ORAL | Status: AC
  Administered 2016-05-29: 20 mg via ORAL
  Filled 2016-05-29: qty 1

## 2016-05-29 MED ORDER — FENTANYL CITRATE (PF) 100 MCG/2ML IJ SOLN
INTRAMUSCULAR | Status: AC
  Filled 2016-05-29: qty 2

## 2016-05-29 MED ORDER — PROPOFOL 500 MG/50ML IV EMUL
INTRAVENOUS | Status: DC | PRN
  Administered 2016-05-29: 75 ug/kg/min via INTRAVENOUS

## 2016-05-29 MED ORDER — PROPOFOL 10 MG/ML IV BOLUS
INTRAVENOUS | Status: DC | PRN
  Administered 2016-05-29: 50 mg via INTRAVENOUS
  Administered 2016-05-29: 20 mg via INTRAVENOUS

## 2016-05-29 SURGICAL SUPPLY — 30 items
BLADE SURG 15 STRL LF DISP TIS (BLADE) ×1 IMPLANT
BLADE SURG 15 STRL SS (BLADE) ×2
CANISTER SUCT 1200ML W/VALVE (MISCELLANEOUS) ×3 IMPLANT
CHLORAPREP W/TINT 26ML (MISCELLANEOUS) ×3 IMPLANT
CNTNR SPEC 2.5X3XGRAD LEK (MISCELLANEOUS)
CONT SPEC 4OZ STER OR WHT (MISCELLANEOUS)
CONTAINER SPEC 2.5X3XGRAD LEK (MISCELLANEOUS) IMPLANT
DERMABOND ADVANCED (GAUZE/BANDAGES/DRESSINGS) ×2
DERMABOND ADVANCED .7 DNX12 (GAUZE/BANDAGES/DRESSINGS) ×1 IMPLANT
DRAPE LAPAROTOMY TRNSV 106X77 (MISCELLANEOUS) ×3 IMPLANT
ELECT CAUTERY BLADE 6.4 (BLADE) ×3 IMPLANT
ELECT REM PT RETURN 9FT ADLT (ELECTROSURGICAL) ×3
ELECTRODE REM PT RTRN 9FT ADLT (ELECTROSURGICAL) ×1 IMPLANT
GLOVE BIO SURGEON STRL SZ7.5 (GLOVE) ×9 IMPLANT
GLOVE INDICATOR 8.0 STRL GRN (GLOVE) ×6 IMPLANT
GOWN STRL REUS W/ TWL LRG LVL3 (GOWN DISPOSABLE) ×2 IMPLANT
GOWN STRL REUS W/TWL LRG LVL3 (GOWN DISPOSABLE) ×4
KIT RM TURNOVER STRD PROC AR (KITS) ×3 IMPLANT
LABEL OR SOLS (LABEL) ×3 IMPLANT
NEEDLE HYPO 25X1 1.5 SAFETY (NEEDLE) ×3 IMPLANT
PACK BASIN MINOR ARMC (MISCELLANEOUS) ×3 IMPLANT
SUT MNCRL 4-0 (SUTURE) ×2
SUT MNCRL 4-0 27XMFL (SUTURE) ×1
SUT SILK 2 0 SH (SUTURE) IMPLANT
SUT VIC AB 3-0 SH 27 (SUTURE) ×2
SUT VIC AB 3-0 SH 27X BRD (SUTURE) ×1 IMPLANT
SUTURE MNCRL 4-0 27XMF (SUTURE) ×1 IMPLANT
SYR BULB EAR ULCER 3OZ GRN STR (SYRINGE) ×3 IMPLANT
SYRINGE 10CC LL (SYRINGE) ×3 IMPLANT
WATER STERILE IRR 1000ML POUR (IV SOLUTION) ×3 IMPLANT

## 2016-05-29 NOTE — Anesthesia Post-op Follow-up Note (Cosign Needed)
Anesthesia QCDR form completed.        

## 2016-05-29 NOTE — H&P (View-Only) (Signed)
Outpatient Surgical Follow Up  05/15/2016  [name] is an 51 y.o. female.   Chief Complaint  Patient presents with  . Follow-up    Bilateral Cyst of skin of breast    HPI: 51 year old female returns to clinic for follow-up for bilateral superficial cyst of the superior aspects of her breasts. She has been scheduled for surgical removal of numerous times in the past several months. Each procedure has been canceled secondary to her cocaine usage. She states that the area is continuing to be tender. The left side is more tender than the right. There has been no signs of infection, drainage or changes in size or location. There are palpable and she still desires removal. She denies any fevers, chills, nausea, vomiting, chest pain, shortness of breath, diarrhea, constipation.  Past Medical History:  Diagnosis Date  . Allergy   . Anemia   . Anxiety   . Asthma    NO INHALERS  . Bipolar disorder (HCC)   . Cocaine abuse in remission   . Depression   . Dyspnea   . Eczema   . Nervous   . Schizophrenia (HCC)   . Scoliosis   . Seizure (HCC)   . Seizures (HCC)    Last seizure, March 09, 2016  . Stroke [facility]) 2007  . Tobacco use     Past Surgical History:  Procedure Laterality Date  . APPENDECTOMY  2000  . BACK SURGERY  2012  . DILATION AND CURETTAGE OF UTERUS      Family History  Problem Relation Age of Onset  . Epilepsy Mother   . Seizures Mother   . Heart disease Father   . Epilepsy Maternal Grandmother   . Heart attack Maternal Grandmother     Social History:  reports that she has been smoking Cigarettes.  She has a 12.50 pack-year smoking history. She has never used smokeless tobacco. She reports that she uses drugs, including Benzodiazepines and Cocaine. She reports that she does not drink alcohol.  Allergies:  Allergies  Allergen Reactions  . Flagyl [Metronidazole] Other (See Comments)    "my eyes go back in the back of my head"    Medications  reviewed.    ROS A multipoint review of systems was completed, all pertinent positives and negatives are documented within the history of present illness and remainder are negative   BP 138/79   Pulse (!) 108   Temp 98.3 F (36.8 C) (Oral)   Wt 43.5 kg (96 lb)   BMI 19.39 kg/m   Physical Exam Gen.: No acute distress Neck: Supple and nontender Lymph nodes: No evidence of cervical or clavicular lymphadenopathy Breasts: Bilateral breasts examined. No palpable Abnormalities within the breast proper. Palpable superficial cysts in the midclavicular line bilaterally Chest: Clear to auscultation Heart: Regular rhythm Abdomen: Soft and nontender    No results found for this or any previous visit (from the past 48 hour(s)). No results found.  Assessment/Plan:  1. Cyst of skin of breast, unspecified laterality 51 year old female with superficial cysts of bilateral breasts. Patient continues to have issues of anxiety. Counseled her extensively about the importance of abstaining from cocaine usage. She voices understanding. The procedure of the surgical excision was again discussed. She voiced understanding and desires to proceed. Plan to proceed in 2 weeks on April 25.  Total of 25 minutes was used on this encounter with greater than 50% of it used for counseling and coordination of care.     Ricarda Frame, MD  FACS General Surgeon  05/15/2016,2:34 PM

## 2016-05-29 NOTE — Brief Op Note (Signed)
05/29/2016  1:18 PM  PATIENT:  [name]  51 y.o. female  PRE-OPERATIVE DIAGNOSIS:  cyst of skin of breast  POST-OPERATIVE DIAGNOSIS:  cyst of skin of breast  PROCEDURE:  Procedure(s): MASS EXCISION CHEST WALL (Bilateral)  SURGEON:  Surgeon(s) and Role:    * Clayburn Pert, MD - Primary  PHYSICIAN ASSISTANT:   ASSISTANTS: none   ANESTHESIA:   local and MAC  EBL:  Total I/O In: 300 [I.V.:300] Out: -   BLOOD ADMINISTERED:none  DRAINS: none   LOCAL MEDICATIONS USED:  MARCAINE    and XYLOCAINE   SPECIMEN:  Source of Specimen:  right and left chest wall masses  DISPOSITION OF SPECIMEN:  PATHOLOGY  COUNTS:  YES  TOURNIQUET:  * No tourniquets in log *  DICTATION: .Dragon Dictation  PLAN OF CARE: Discharge to home after PACU  PATIENT DISPOSITION:  PACU - hemodynamically stable.   Delay start of Pharmacological VTE agent (>24hrs) due to surgical blood loss or risk of bleeding: not applicable

## 2016-05-29 NOTE — Telephone Encounter (Signed)
Patient is calling because she has surgery today and would like to come in earlier for it. Please call patient and advice.

## 2016-05-29 NOTE — Discharge Instructions (Signed)
Excisional Biopsy, Care After These instructions give you information about caring for yourself after your procedure. Your doctor may also give you more specific instructions. Call your doctor if you have any problems or questions after your procedure. Follow these instructions at home: Medicines   Take over-the-counter and prescription medicines only as told by your doctor.  Do not drive for 24 hours if you received a sedative.  Do not drink alcohol while taking pain medicine.  Do not drive or use heavy machinery while taking prescription pain medicine. Biopsy Site Care    Follow instructions from your doctor about how to take care of your cut from surgery (incision) or puncture area. Make sure you:  Wash your hands with soap and water before you change your bandage. If you cannot use soap and water, use hand sanitizer.  Change any bandages (dressings) as told by your doctor.  Leave any stitches (sutures), skin glue, or skin tape (adhesive) strips in place. They may need to stay in place for 2 weeks or longer. If tape strips get loose and curl up, you may trim the loose edges. Do not remove tape strips completely unless your doctor says it is okay.  If you have stitches, keep them dry when you take a bath or a shower.  Check your cut or puncture area every day for signs of infection. Check for:  More redness, swelling, or pain.  More fluid or blood.  Warmth.  Pus or a bad smell.  Protect the biopsy area. Do not let the area get bumped. Activity   Avoid activities that could pull the biopsy site open.  Avoid stretching.  Avoid reaching.  Avoid exercise.  Avoid sports.  Avoid lifting anything that is heavier than 3 pounds (1.4 kg).  Return to your normal activities as told by your doctor. Ask your doctor what activities are safe for you. General instructions   Continue your normal diet.  Wear a good support bra for as long as told by your doctor.  Get checked  for extra fluid in your body (lymphedema) as often as told by your doctor.  Keep all follow-up visits as told by your doctor. This is important. Contact a health care provider if:  You have more redness, swelling, or pain at the biopsy site.  You have more fluid or blood coming from your biopsy site.  Your biopsy site feels warm to the touch.  You have pus or a bad smell coming from the biopsy site.  Your biopsy site breaks open after the stitches, staples, or skin tape strips have been removed.  You have a rash.  You have a fever. Get help right away if:  You have more bleeding (more than a small spot) from the biopsy site.  You have trouble breathing.  You have red streaks around the biopsy site. This information is not intended to replace advice given to you by your health care provider. Make sure you discuss any questions you have with your health care provider. Document Released: 11/17/2008 Document Revised: 09/28/2015 Document Reviewed: 10/25/2014 Elsevier Interactive Patient Education  2017 Else[employer]  AMBULATORY SURGERY  DISCHARGE INSTRUCTIONS   1) The drugs that you were given will stay in your system until tomorrow so for the next 24 hours you should not:  A) Drive an automobile B) Make any legal decisions C) Drink any alcoholic beverage   2) You may resume regular meals tomorrow.  Today it is better to start with liquids and gradually  work up to [employer].  You may eat anything you prefer, but it is better to start with liquids, then soup and crackers, and gradually work up to solid foods.   3) Please notify your doctor immediately if you have any unusual bleeding, trouble breathing, redness and pain at the surgery site, drainage, fever, or pain not relieved by medication.    4) Additional Instructions:        Please contact your physician with any problems or Same Day Surgery at [phone], Monday through Friday 6 am to 4 pm, or [employer]  at [facility] number at [phone].

## 2016-05-29 NOTE — Op Note (Signed)
   Pre-operative Diagnosis: Bilateral chest wall masses  Post-operative Diagnosis: Same  Procedure: Bilateral Chest wall mass excisional biopsies  Surgeon: Ricarda Frame   Assistants: None  Anesthesia: Monitored Local Anesthesia with Sedation  ASA Class: 2  Surgeon: Ricarda Frame, MD FACS  Anesthesia: Gen. with endotracheal tube  Assistant:None  Procedure Details  The patient was seen again in the Holding Room. The benefits, complications, treatment options, and expected outcomes were discussed with the patient. The risks of bleeding, infection, recurrence of symptoms, failure to resolve symptoms,  any of which could require further surgery were reviewed with the patient.   The patient was taken to Operating Room, identified as [name] and the procedure verified.  A Time Out was held and the above information confirmed.  Prior to the induction of general anesthesia, VTE prophylaxis was in place. Monitored anesthesia was then administered and tolerated well. After the induction, the chest was prepped with Chloraprep and draped in the sterile fashion. The patient was positioned in the supine position.  Bilaterally marked lesions were localized a 50-50 mixture 1% lidocaine 0.5% Marcaine with epinephrine. Patient became combative during the injection and injections had to be held until sedation to be increased. Once the patient was adequate sedated the areas were again re-localized. Starting on the right skin incision was made a 15 blade scalpel. The palpable 8 mm mass was grasped with an Adson forceps and sharply dissected from the tissue around it. It was impossible field specimen. Hemostasis was obtained with electrocautery and packing with a gauze sponge.   Turning our attention to the left and the identical procedure was performed. The skin was incised with 15 blade scalpel and the palpable 12 mm mass was grasped with an Adson forceps. The mass was sharply dissected out with  the 15 blade scalpel. Prior to being fully released was in a very layered into and a dark, thick fluid consistent with toothpaste it was expelled from the cyst. It was excised in toto and passed off field specimen.  Bilateral skin incisions were re-localized before mentioned local anesthetic. Meticulous hemostasis was insured with electrocautery. A simple interrupted subcuticular 4-0 Monocryl was placed to close both incisions. Both incisions were then sealed with Dermabond. Patient tolerated procedure well, was awoken from monitored anesthesia and transferred to the PACU in good condition. There were no immediate, complications and all counts were correct at the end of the procedure.  Findings: Bilateral chest wall masses   Estimated Blood Loss: 5 mL         Drains: None         Specimens: Right and left chest wall mass          Complications: None                  Condition: Good   Ricarda Frame, MD, FACS

## 2016-05-29 NOTE — Interval H&P Note (Signed)
History and Physical Interval Note:  05/29/2016 12:02 PM  [name]  has presented today for surgery, with the diagnosis of cyst of skin of breast  The various methods of treatment have been discussed with the patient and family. After consideration of risks, benefits and other options for treatment, the patient has consented to  Procedure(s): MASS EXCISION CHEST WALL (Bilateral) as a surgical intervention .  The patient's history has been reviewed, patient examined, no change in status, stable for surgery.  I have reviewed the patient's chart and labs.  Questions were answered to the patient's satisfaction.     Ricarda Frame

## 2016-05-29 NOTE — Telephone Encounter (Signed)
Spoke with [name] in the Operating Room and we can move patient up to start at 12pm per OR and Dr. Tonita Cong. Patient was notified at 10:05am that she needs to come to hospital as soon as possible to be prepped for surgery. She verbalizes understanding.  OR made aware that I did contact patient and she has been advised to come to hospital.

## 2016-05-29 NOTE — Transfer of Care (Signed)
Immediate Anesthesia Transfer of Care Note  Patient: [name]  Procedure(s) Performed: Procedure(s): MASS EXCISION CHEST WALL (Bilateral)  Patient Location: PACU  Anesthesia Type:General  Level of Consciousness: awake and alert   Airway & Oxygen Therapy: Patient Spontanous Breathing  Post-op Assessment: Report given to RN and Post -op Vital signs reviewed and stable  Post vital signs: Reviewed  Last Vitals:  Vitals:   05/29/16 1048 05/29/16 1316  BP: 131/77 111/76  Pulse: (!) 110 (!) 107  Resp: 16 14  Temp: 36.4 C 37.1 C    Last Pain:  Vitals:   05/29/16 1048  TempSrc: Tympanic  PainSc: 4          Complications: No apparent anesthesia complications

## 2016-05-29 NOTE — Anesthesia Preprocedure Evaluation (Signed)
Anesthesia Evaluation  Patient identified by MRN, date of birth, ID band Patient awake    Reviewed: Allergy & Precautions, H&P , NPO status , Patient's Chart, lab work & pertinent test results, reviewed documented beta blocker date and time   History of Anesthesia Complications Negative for: history of anesthetic complications  Airway Mallampati: III  TM Distance: >3 FB Neck ROM: full    Dental  (+) Caps, Missing, Poor Dentition, Dental Advidsory Given   Pulmonary shortness of breath and with exertion, asthma , neg sleep apnea, neg COPD, neg recent URI, Current Smoker,           Cardiovascular Exercise Tolerance: Good negative cardio ROS       Neuro/Psych Seizures -,  PSYCHIATRIC DISORDERS (Schizophrenia, bipolar) CVA, Residual Symptoms    GI/Hepatic negative GI ROS, Neg liver ROS,   Endo/Other  negative endocrine ROS  Renal/GU negative Renal ROS  negative genitourinary   Musculoskeletal   Abdominal   Peds  Hematology  (+) Blood dyscrasia, anemia ,   Anesthesia Other Findings Past Medical History: No date: Allergy No date: Anemia No date: Anxiety No date: Asthma     Comment: NO INHALERS No date: Bipolar disorder (HCC) No date: Cocaine abuse in remission No date: Depression No date: Dyspnea No date: Eczema No date: Nervous No date: Schizophrenia (HCC) No date: Scoliosis No date: Seizure (HCC) No date: Seizures (HCC)     Comment: Last seizure, May 20, 2016-PT WAS               ALONE-UNSURE LENGTH OF SEIZURE-SCRAPED NOSE AND              LIP 2007: Stroke (HCC) No date: Tobacco use   Reproductive/Obstetrics negative OB ROS                             Anesthesia Physical Anesthesia Plan  ASA: III  Anesthesia Plan: General   Post-op Pain Management:    Induction:   Airway Management Planned:   Additional Equipment:   Intra-op Plan:   Post-operative Plan:    Informed Consent: I have reviewed the patients History and Physical, chart, labs and discussed the procedure including the risks, benefits and alternatives for the proposed anesthesia with the patient or authorized representative who has indicated his/her understanding and acceptance.   Dental Advisory Given  Plan Discussed with: Anesthesiologist, CRNA and Surgeon  Anesthesia Plan Comments:         Anesthesia Quick Evaluation

## 2016-05-30 ENCOUNTER — Telehealth: Payer: Self-pay

## 2016-05-30 LAB — SURGICAL PATHOLOGY

## 2016-05-30 MED ORDER — TRAMADOL HCL 50 MG PO TABS: 50.0000 mg | ORAL_TABLET | Freq: Four times a day (QID) | ORAL | 0 refills | Status: DC | PRN

## 2016-05-30 NOTE — Telephone Encounter (Signed)
Patient had a Bilateral Chest wall mass excisional biopsied on 05/29/2016 and is calling because she is in a lot of pain and the tylenol is not working. She states she hasn't been able to sleep at all, she can't lay down, and her pain is too much. She would like a pain medication to be called into her pharmacy.   You can call her at [phone]

## 2016-05-30 NOTE — Telephone Encounter (Signed)
Patient is calling again. She states she is in a lot of pain and would like the doctor to prescribe her a pain medication. Please call patient and advice.

## 2016-05-30 NOTE — Telephone Encounter (Signed)
[name], Certified Peer Suuport Specialist from [employer], [place] in [place] came in to pick up patients prescription. I have checked her Identification badge.

## 2016-05-30 NOTE — Telephone Encounter (Signed)
Spoke with patient at this time. She states that she is having 12/10 incisional pain from yesterday. She has taken 16 Tylenol ( ) and 8 tablets of Motrin  within the last 24 hours without relief of pain.   Spoke with Dr. Tonita Cong. He has ordered patient Tramadol - 1tab q6h prn for pain.  Patient was made aware of this and will come to pick up this prescription prior to 5pm today.

## 2016-05-30 NOTE — Telephone Encounter (Signed)
[name] called in at this time requesting prescription for pain medicine.  She is currently taking tylenol and Motrin with no relief. She stated she is hurting bad. She wants to go back to work this week, however the pain is unbearable.   She is unable to sleep due to the pain. She denies fever, chills.

## 2016-05-31 NOTE — Anesthesia Postprocedure Evaluation (Signed)
Anesthesia Post Note  Patient: [name]  Procedure(s) Performed: Procedure(s) (LRB): MASS EXCISION CHEST WALL (Bilateral)  Patient location during evaluation: PACU Anesthesia Type: General Level of consciousness: awake and alert Pain management: pain level controlled Vital Signs Assessment: post-procedure vital signs reviewed and stable Respiratory status: spontaneous breathing, nonlabored ventilation, respiratory function stable and patient connected to nasal cannula oxygen Cardiovascular status: blood pressure returned to baseline and stable Postop Assessment: no signs of nausea or vomiting Anesthetic complications: no     Last Vitals:  Vitals:   05/29/16 1410 05/29/16 1420  BP: 105/78 118/78  Pulse: 96 (!) 116  Resp: 16 18  Temp: 36.9 C     Last Pain:  Vitals:   05/30/16 1709  TempSrc:   PainSc: 0-No pain                 Lenard Simmer

## 2016-06-04 ENCOUNTER — Telehealth: Payer: Self-pay

## 2016-06-04 NOTE — Telephone Encounter (Signed)
Called patient back and she stated that she was still hurting from her chest. She stated that yesterday she had to leave work because it was too much to bare. I asked patient if she was taking her Tramadol and she stated that she had but that it was making her loopy and not taking the pain out. I recommended for her to take Ibuprofen 800 MG every 8 hours for inflammation and she stated that she had tried that and Tylenol and nothing takes the pain away. She also stated that she had mentioned to [facility] and that she told her that she was not going to continue taking Ibuprofen nor Tylenol because she wasn't going to hurt her Liver. She also stated that she will be going to [facility] on 06/06/2016 and she will ask for Ibuprofen 800 MG. I told her that she could just take the over-the-counter and she stated that they are not the same.  Patient also wanted to know when we would remove her stitches. I told her that they will not remove any stitches. I believe that she understood and had no further questions. I told her to call me if she continued to have pain on her chest before her follow up appointment.

## 2016-06-04 NOTE — Telephone Encounter (Signed)
[name] is to see how long her stiches are going to be in. She states it's not healed but some glue already came out. Her site itches, is sore, and burns like fire. She states the Tramadol she was given works, but doesn't work. Please call patient and advice.

## 2016-06-06 ENCOUNTER — Ambulatory Visit: Payer: Self-pay | Admitting: Urology

## 2016-06-06 VITALS — BP 134/90 | HR 105 | Temp 98.8°F | Ht <= 58 in | Wt 93.8 lb

## 2016-06-06 DIAGNOSIS — R569 Unspecified convulsions: Secondary | ICD-10-CM

## 2016-06-06 DIAGNOSIS — R52 Pain, unspecified: Secondary | ICD-10-CM

## 2016-06-06 DIAGNOSIS — Z0000 Encounter for general adult medical examination without abnormal findings: Secondary | ICD-10-CM

## 2016-06-06 DIAGNOSIS — G40909 Epilepsy, unspecified, not intractable, without status epilepticus: Secondary | ICD-10-CM

## 2016-06-06 DIAGNOSIS — R591 Generalized enlarged lymph nodes: Secondary | ICD-10-CM

## 2016-06-06 MED ORDER — DIVALPROEX SODIUM 500 MG PO DR TAB: 500.0000 mg | DELAYED_RELEASE_TABLET | Freq: Three times a day (TID) | ORAL | 11 refills | Status: DC

## 2016-06-06 MED ORDER — CYCLOBENZAPRINE HCL 10 MG PO TABS: 10.0000 mg | ORAL_TABLET | Freq: Three times a day (TID) | ORAL | 1 refills | Status: DC

## 2016-06-06 MED ORDER — IBUPROFEN 800 MG PO TABS: 800.0000 mg | ORAL_TABLET | Freq: Three times a day (TID) | ORAL | 0 refills | Status: DC | PRN

## 2016-06-06 MED ORDER — ALBUTEROL SULFATE HFA 108 (90 BASE) MCG/ACT IN AERS: 2.0000 | INHALATION_SPRAY | Freq: Four times a day (QID) | RESPIRATORY_TRACT | 2 refills | Status: DC | PRN

## 2016-06-06 MED ORDER — TRIAMCINOLONE ACETONIDE 0.1 % EX CREA: TOPICAL_CREAM | CUTANEOUS | 1 refills | Status: DC

## 2016-06-06 MED ORDER — PHENYTOIN SODIUM EXTENDED 100 MG PO CAPS: 100.0000 mg | ORAL_CAPSULE | Freq: Three times a day (TID) | ORAL | 11 refills | Status: DC

## 2016-06-06 MED ORDER — NICOTINE 10 MG IN INHA: RESPIRATORY_TRACT | 2 refills | Status: DC

## 2016-06-06 NOTE — Progress Notes (Signed)
Patient: Shelly DoppDoretha A [name] Female    DOB: 05/22/1965   51 y.o.   MRN: [id number] Visit Date: 06/06/2016  Today's Provider: [facility]   Chief Complaint  Patient presents with  . Follow-up   Subjective:    HPI Requesting refills of ibuprofen, cyclobenzaprine, divalproex, phenytoin, triamcinolone cream and change nicotine inhaler to full flavor. Labs are wnl.  Phenytoin level was low.    She is having SOB and used to use an inhaler in the past.  No CP.    She has a lymph node that has been present for 3 months.      Allergies  Allergen Reactions  . Flagyl [Metronidazole] Other (See Comments)    "my eyes go back in the back of my head"   Previous Medications   CYCLOBENZAPRINE (FLEXERIL) 10 MG TABLET    Take 1 tablet (10 mg total) by mouth 3 (three) times daily.   DIVALPROEX (DEPAKOTE) 500 MG DR TABLET    Take 1 tablet (500 mg total) by mouth 3 (three) times daily.   IBUPROFEN (ADVIL,MOTRIN) 800 MG TABLET    Take 1 tablet (800 mg total) by mouth every 8 (eight) hours as needed.   NICOTINE (NICOTROL) 10 MG INHALER    Inhale 16-20 Cartridges daily (20 min of continuous puffing total) into the lungs as needed for smoking cessation.   PHENYTOIN (DILANTIN) 100 MG ER CAPSULE    Take 1 capsule (100 mg total) by mouth 3 (three) times daily.   TRIAMCINOLONE CREAM (KENALOG) 0.1 %    Apply 1 application topically 2 (two) times daily.    Review of Systems  All other systems reviewed and are negative.   Social History  Substance Use Topics  . Smoking status: Current Every Day Smoker    Packs/day: 0.75    Years: 47.00    Types: Cigarettes  . Smokeless tobacco: Never Used     Comment: Interested in resources   . Alcohol use 29.4 oz/week    49 Cans of beer per week   Objective:   BP 134/90   Pulse (!) 105   Temp 98.8 F (37.1 C)   Ht 4\' 9"  (1.448 m)   Wt 93 lb 12.8 oz (42.5 kg)   BMI 20.30 kg/m   Physical Exam  Constitutional: She is oriented to person, place, and  time. She appears well-developed and well-nourished.  HENT:  Head: Normocephalic and atraumatic.  Right Ear: External ear normal.  Left Ear: External ear normal.  Nose: Nose normal.  Mouth/Throat: Oropharynx is clear and moist.  Right submandibular lymph node tender to palpation  Cardiovascular: Normal rate, regular rhythm and normal heart sounds.   Pulmonary/Chest: Effort normal and breath sounds normal.  Left chest incision clean and dry   Abdominal: Soft. Bowel sounds are normal.  Neurological: She is alert and oriented to person, place, and time. Coordination abnormal.  Skin: Skin is warm and dry.        Assessment & Plan:    1. Lymph node  - present for 3 months - obtain neck CT  2. Chest wall mass  - removed by general surgery - benign  3. Tobacco abuse  - script for Nicotine inhaler  4. Seizure  - recheck phenytoin levels   - RTC in one week  5. COPD  - start albuterol inhaler  - RTC in one week to check use          [facility]   [facility]

## 2016-06-12 ENCOUNTER — Telehealth: Payer: Self-pay

## 2016-06-12 ENCOUNTER — Telehealth: Payer: Self-pay | Admitting: Pharmacist

## 2016-06-12 ENCOUNTER — Encounter: Payer: Self-pay | Admitting: Surgery

## 2016-06-12 ENCOUNTER — Ambulatory Visit (INDEPENDENT_AMBULATORY_CARE_PROVIDER_SITE_OTHER): Payer: Self-pay | Admitting: Surgery

## 2016-06-12 VITALS — BP 136/87 | HR 92 | Temp 98.9°F | Ht <= 58 in | Wt 95.6 lb

## 2016-06-12 DIAGNOSIS — R222 Localized swelling, mass and lump, trunk: Secondary | ICD-10-CM

## 2016-06-12 NOTE — Telephone Encounter (Signed)
Called pt to make 1 wk f/u appt and lab work. Appts made. Also discussed needing ct appt. Called and got that for  05/16 at 3:00. Pt verbalized understanding.

## 2016-06-12 NOTE — Patient Instructions (Signed)
Please call our office if you have questions or concerns.   

## 2016-06-12 NOTE — Telephone Encounter (Signed)
Patient eligible with [facility] till Feb[facility].
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
## 2016-06-18 ENCOUNTER — Other Ambulatory Visit: Payer: Self-pay

## 2016-06-18 DIAGNOSIS — R591 Generalized enlarged lymph nodes: Secondary | ICD-10-CM

## 2016-06-18 DIAGNOSIS — G40909 Epilepsy, unspecified, not intractable, without status epilepticus: Secondary | ICD-10-CM

## 2016-06-19 ENCOUNTER — Ambulatory Visit: Payer: Self-pay

## 2016-06-19 LAB — PHENYTOIN LEVEL, TOTAL: Phenytoin (Dilantin), Serum: <0.8 ug/mL — ABNORMAL LOW (ref 10.0–20.0)

## 2016-06-20 ENCOUNTER — Telehealth: Payer: Self-pay

## 2016-06-20 ENCOUNTER — Ambulatory Visit: Payer: Self-pay

## 2016-06-20 NOTE — Telephone Encounter (Signed)
Received PAP application from [facility] for Ventolin placed for provider to sign. 

## 2016-06-20 NOTE — Telephone Encounter (Signed)
Placed signed application/script in [facility] folder for pickup. 

## 2016-06-27 ENCOUNTER — Ambulatory Visit: Payer: Self-pay

## 2016-06-27 ENCOUNTER — Telehealth: Payer: Self-pay | Admitting: Pharmacist

## 2016-06-27 NOTE — Telephone Encounter (Signed)
06/27/16 Nicotrol Inhaler-Inhale 16 - 20 cartridges daily (20 minutes) of continuous puffing; faxed [employer] application for processing.

## 2016-07-02 ENCOUNTER — Ambulatory Visit: Payer: Self-pay | Admitting: Adult Health Nurse Practitioner

## 2016-07-02 ENCOUNTER — Telehealth: Payer: Self-pay | Admitting: Pharmacist

## 2016-07-02 DIAGNOSIS — R569 Unspecified convulsions: Secondary | ICD-10-CM

## 2016-07-02 DIAGNOSIS — G40909 Epilepsy, unspecified, not intractable, without status epilepticus: Secondary | ICD-10-CM

## 2016-07-02 MED ORDER — DIVALPROEX SODIUM 500 MG PO DR TAB: 500.0000 mg | DELAYED_RELEASE_TABLET | Freq: Three times a day (TID) | ORAL | 11 refills | Status: DC

## 2016-07-02 MED ORDER — ALBUTEROL SULFATE HFA 108 (90 BASE) MCG/ACT IN AERS: 2.0000 | INHALATION_SPRAY | Freq: Four times a day (QID) | RESPIRATORY_TRACT | 2 refills | Status: DC | PRN

## 2016-07-02 MED ORDER — PHENYTOIN SODIUM EXTENDED 100 MG PO CAPS: 100.0000 mg | ORAL_CAPSULE | Freq: Three times a day (TID) | ORAL | 11 refills | Status: DC

## 2016-07-02 NOTE — Telephone Encounter (Signed)
07/02/16 Faxed [employer] application for Enrollment for Ventolin HFA 90 mcg Inhale 2 puffs every 6 hours as needed for wheezing or shortness of breath.

## 2016-07-02 NOTE — Progress Notes (Signed)
  Patient: [name] Female    DOB: 10/31/1965   51 y.o.   MRN: [id number] Visit Date: 07/02/2016  Today's Provider: Jacelyn Pieah Doles-Johnson, NP   No chief complaint on file.  Subjective:    HPI  FU for COPD, prescribed inhaler at last OV. States inhaler is helping, needs a refill.  Pt states that she is smoking a ppd.  She has not gotten her nicotine patches yet.    Reports no recent seizure activity.   Level was <0.8.  Pt states that she is out of her Dilantin.  States that when she goes to [facility] she "can never get it refilled"   States her R wrist has been hurting for two weeks.  Denies injury.  No interventions tried.        Allergies  Allergen Reactions  . Flagyl [Metronidazole] Other (See Comments)    "my eyes go back in the back of my head"   Previous Medications   ALBUTEROL (PROVENTIL HFA;VENTOLIN HFA) 108 (90 BASE) MCG/ACT INHALER    Inhale 2 puffs into the lungs every 6 (six) hours as needed for wheezing or shortness of breath.   CYCLOBENZAPRINE (FLEXERIL) 10 MG TABLET    Take 1 tablet (10 mg total) by mouth 3 (three) times daily.   DIVALPROEX (DEPAKOTE) 500 MG DR TABLET    Take 1 tablet (500 mg total) by mouth 3 (three) times daily.   IBUPROFEN (ADVIL,MOTRIN) 800 MG TABLET    Take 1 tablet (800 mg total) by mouth every 8 (eight) hours as needed.   NICOTINE (NICOTROL) 10 MG INHALER    Inhale 16-20 Cartridges daily (20 min of continuous puffing total) into the lungs as needed for smoking cessation.   PHENYTOIN (DILANTIN) 100 MG ER CAPSULE    Take 1 capsule (100 mg total) by mouth 3 (three) times daily.   TRIAMCINOLONE CREAM (KENALOG) 0.1 %    Apply 1 application topically 2 (two) times daily.    Review of Systems  All other systems reviewed and are negative.   Social History  Substance Use Topics  . Smoking status: Current Every Day Smoker    Packs/day: 0.75    Years: 47.00    Types: Cigarettes  . Smokeless tobacco: Never Used     Comment: Interested in  resources   . Alcohol use 29.4 oz/week    49 Cans of beer per week   Objective:   BP 120/78   Pulse 97   Temp 98.4 F (36.9 C)   Wt 96 lb (43.5 kg)   BMI 20.77 kg/m   Physical Exam  Constitutional: She appears well-developed and well-nourished.  HENT:  Head: Normocephalic and atraumatic.  Cardiovascular: Normal rate, regular rhythm and normal heart sounds.   Pulmonary/Chest: Effort normal and breath sounds normal.  Abdominal: Soft. Bowel sounds are normal.  Musculoskeletal:       Right wrist: She exhibits tenderness. She exhibits normal range of motion, no swelling, no crepitus and no deformity.  Neurological: She is alert.  Skin: Skin is warm and dry.  Vitals reviewed.       Assessment & Plan:      COPD:  Continue inhaler.    Seizure:  Refill medications.  Continue as directed.  Seizure precautions.       Wrist wrap with ACE bandage.  Take Ibuprofen 800mg  BID x 3 days.  Apply ice.    Jacelyn Pieah Doles-Johnson, NP   [facility]

## 2016-07-04 ENCOUNTER — Ambulatory Visit: Admission: RE | Admit: 2016-07-04 | Payer: Self-pay | Source: Ambulatory Visit

## 2016-07-10 ENCOUNTER — Ambulatory Visit
Admission: RE | Admit: 2016-07-10 | Discharge: 2016-07-10 | Disposition: A | Discharge status: Home / Self Care | Payer: Self-pay | Source: Ambulatory Visit | Attending: Urology | Admitting: Urology

## 2016-07-10 DIAGNOSIS — R591 Generalized enlarged lymph nodes: Secondary | ICD-10-CM | POA: Insufficient documentation

## 2016-07-10 MED ORDER — IOPAMIDOL (ISOVUE-300) INJECTION 61%
75.0000 mL | Freq: Once | INTRAVENOUS | Status: AC | PRN
  Administered 2016-07-10: 75 mL via INTRAVENOUS

## 2016-07-15 ENCOUNTER — Other Ambulatory Visit: Payer: Self-pay | Admitting: Urology

## 2016-07-15 DIAGNOSIS — R52 Pain, unspecified: Secondary | ICD-10-CM

## 2016-07-16 ENCOUNTER — Other Ambulatory Visit: Payer: Self-pay | Admitting: Internal Medicine

## 2016-07-16 DIAGNOSIS — R52 Pain, unspecified: Secondary | ICD-10-CM

## 2016-07-24 NOTE — Progress Notes (Unsigned)
A user error has taken place.

## 2016-07-30 ENCOUNTER — Telehealth: Payer: Self-pay

## 2016-07-30 NOTE — Telephone Encounter (Signed)
Patient wanted to reschedule her lab appointment because she could not be here at 12:45.

## 2016-07-31 ENCOUNTER — Other Ambulatory Visit: Payer: Self-pay

## 2016-08-01 ENCOUNTER — Other Ambulatory Visit: Payer: Self-pay

## 2016-08-01 DIAGNOSIS — R569 Unspecified convulsions: Secondary | ICD-10-CM

## 2016-08-07 LAB — PHENYTOIN LEVEL, FREE AND TOTAL

## 2016-08-15 ENCOUNTER — Telehealth: Payer: Self-pay | Admitting: Pharmacist

## 2016-08-15 NOTE — Telephone Encounter (Signed)
08/15/16 Placed refill online with [facility] for Ventolin HFA, they will release 10/01/16.Forde RadonAJ

## 2016-08-22 ENCOUNTER — Other Ambulatory Visit: Payer: Self-pay | Admitting: Internal Medicine

## 2016-08-22 DIAGNOSIS — Z0000 Encounter for general adult medical examination without abnormal findings: Secondary | ICD-10-CM

## 2016-09-03 ENCOUNTER — Telehealth: Payer: Self-pay | Admitting: Pharmacist

## 2016-09-03 NOTE — Telephone Encounter (Signed)
09/03/16 Called [employer] to check on status of application for [employer] Inhaler cartridges that was faxed 06/27/16, according to Ona she is unable to locate application. She did explain that they changed systems in June and some accounts did not cross over, she recommended I refax with a note that it was previously faxed 06/27/16, I have done so today.Forde RadonAJ

## 2016-09-11 ENCOUNTER — Other Ambulatory Visit: Payer: Self-pay | Admitting: Internal Medicine

## 2016-09-11 DIAGNOSIS — R52 Pain, unspecified: Secondary | ICD-10-CM

## 2016-09-19 ENCOUNTER — Telehealth: Payer: Self-pay

## 2016-09-19 NOTE — Telephone Encounter (Signed)
Placed signed application/script in [facility] folder for pickup. 

## 2016-09-19 NOTE — Telephone Encounter (Signed)
Received PAP application from [facility] for Ventolin placed for provider to sign. 

## 2016-09-25 ENCOUNTER — Telehealth: Payer: Self-pay | Admitting: Pharmacist

## 2016-09-25 NOTE — Telephone Encounter (Signed)
09/25/16 Faxed script to [facility] for Ventolin HFA 90 mcg Inhale 2 puffs every 6 hours as needed for wheezing or shortness of breath, the quanity of 4. Noting change in quanity.Forde Radon

## 2016-09-30 ENCOUNTER — Telehealth: Payer: Self-pay | Admitting: Pharmacist

## 2016-09-30 NOTE — Telephone Encounter (Signed)
09/30/16 I received [employer] dated 09/17/16 where we received 1 box of Nicotrol Inhaler Cartridges, should have been for (8). Today I called [employer] spoke with Okey Dupre, she reviewed and has processed order# [id number] for (7) additional boxes to complete the order, allow 7-10 days to receive. Forde Radon

## 2016-10-15 ENCOUNTER — Telehealth: Payer: Self-pay | Admitting: Nurse Practitioner

## 2016-10-15 NOTE — Telephone Encounter (Signed)
No reason for call

## 2016-10-29 ENCOUNTER — Ambulatory Visit: Payer: Self-pay | Admitting: Adult Health Nurse Practitioner

## 2016-10-29 VITALS — BP 115/77 | HR 97 | Temp 98.6°F | Ht 60.0 in | Wt 96.0 lb

## 2016-10-29 LAB — GLUCOSE, POCT (MANUAL RESULT ENTRY): POC Glucose: 84 mg/dl (ref 70–99)

## 2016-11-07 ENCOUNTER — Ambulatory Visit: Payer: Self-pay | Admitting: Adult Health Nurse Practitioner

## 2016-11-07 DIAGNOSIS — R52 Pain, unspecified: Secondary | ICD-10-CM

## 2016-11-07 DIAGNOSIS — R569 Unspecified convulsions: Secondary | ICD-10-CM

## 2016-11-07 DIAGNOSIS — G40909 Epilepsy, unspecified, not intractable, without status epilepticus: Secondary | ICD-10-CM

## 2016-11-07 DIAGNOSIS — Z0000 Encounter for general adult medical examination without abnormal findings: Secondary | ICD-10-CM

## 2016-11-07 MED ORDER — IBUPROFEN 800 MG PO TABS: 800.0000 mg | ORAL_TABLET | Freq: Three times a day (TID) | ORAL | 0 refills | Status: DC | PRN

## 2016-11-07 MED ORDER — PHENYTOIN SODIUM EXTENDED 100 MG PO CAPS: 100.0000 mg | ORAL_CAPSULE | Freq: Three times a day (TID) | ORAL | 11 refills | Status: DC

## 2016-11-07 MED ORDER — DIVALPROEX SODIUM 500 MG PO DR TAB: 500.0000 mg | DELAYED_RELEASE_TABLET | Freq: Three times a day (TID) | ORAL | 11 refills | Status: DC

## 2016-11-07 MED ORDER — CYCLOBENZAPRINE HCL 10 MG PO TABS: 10.0000 mg | ORAL_TABLET | Freq: Every day | ORAL | 0 refills | Status: DC

## 2016-11-07 NOTE — Progress Notes (Signed)
  Patient: [name] Female    DOB: [birth year]   51 y.o.   MRN: [id number] Visit Date: 11/07/2016  Today's Provider: Jacelyn Pi, NP   Chief Complaint  Patient presents with  . Cyst    On R wrist  . Medication Refill    Phenytoin   Subjective:    HPI   Pt states that she does not have any dilantin.  States that she keeps calling and they are not answering.  Pt states that she has been out of medications x 4 days and had two seizures last week.  Pt states that heat and stress trigger her seizures.   Pt states that she take Flexeril and Ibuprofen for pain.  She states that her back hurts when she walks, sits or stands for extended periods of time.      Allergies  Allergen Reactions  . Flagyl [Metronidazole] Other (See Comments)    "my eyes go back in the back of my head"   Previous Medications   ALBUTEROL (PROVENTIL HFA;VENTOLIN HFA) 108 (90 BASE) MCG/ACT INHALER    Inhale 2 puffs into the lungs every 6 (six) hours as needed for wheezing or shortness of breath.   CYCLOBENZAPRINE (FLEXERIL) 10 MG TABLET    TAKE ONE TABLET BY MOUTH 3 TIMES A DAY   DIVALPROEX (DEPAKOTE) 500 MG DR TABLET    Take 1 tablet (500 mg total) by mouth 3 (three) times daily.   IBUPROFEN (ADVIL,MOTRIN) 800 MG TABLET    TAKE ONE TABLET BY MOUTH EVERY 8 HOURS AS NEEDED   NICOTINE (NICOTROL) 10 MG INHALER    Inhale 16-20 Cartridges daily (20 min of continuous puffing total) into the lungs as needed for smoking cessation.   PHENYTOIN (DILANTIN) 100 MG ER CAPSULE    Take 1 capsule (100 mg total) by mouth 3 (three) times daily.   TRIAMCINOLONE CREAM (KENALOG) 0.1 %    Apply 1 application topically 2 (two) times daily.    Review of Systems  All other systems reviewed and are negative.   Social History  Substance Use Topics  . Smoking status: Current Every Day Smoker    Packs/day: 0.75    Years: 47.00    Types: Cigarettes  . Smokeless tobacco: Never Used     Comment: Interested in resources    . Alcohol use 29.4 oz/week    49 Cans of beer per week   Objective:   BP 125/80   Pulse 91   Temp 98.2 F (36.8 C)   Wt 97 lb 8 oz (44.2 kg)   BMI 19.04 kg/m   Physical Exam  Constitutional: She is oriented to person, place, and time. She appears well-developed and well-nourished.  HENT:  Head: Normocephalic and atraumatic.  Cardiovascular: Normal rate, regular rhythm and normal heart sounds.   Pulmonary/Chest: Effort normal and breath sounds normal.  Abdominal: Soft. Bowel sounds are normal.  Neurological: She is alert and oriented to person, place, and time.  Skin: Skin is warm and dry.  Vitals reviewed.       Assessment & Plan:           Seizures:  Refills on medications.  Seizure precautions.   Discussed importance of medications.  4 week FU for Dilantin and Depakote evel to ensure therapeutic.    Referral to chiropractor for back pain.  Continue current regimen.   Jacelyn Pi, NP   [facility]

## 2016-11-26 ENCOUNTER — Telehealth: Payer: Self-pay | Admitting: Adult Health Nurse Practitioner

## 2016-11-26 NOTE — Telephone Encounter (Signed)
Wanted to make appt on 10/24 at a later time. Called back

## 2016-11-27 ENCOUNTER — Ambulatory Visit: Payer: Self-pay | Admitting: Chiropractor

## 2016-11-27 ENCOUNTER — Ambulatory Visit
Admission: RE | Admit: 2016-11-27 | Discharge: 2016-11-27 | Disposition: A | Discharge status: Home / Self Care | Payer: Self-pay | Source: Ambulatory Visit | Attending: Chiropractor | Admitting: Chiropractor

## 2016-11-27 DIAGNOSIS — G8929 Other chronic pain: Secondary | ICD-10-CM | POA: Insufficient documentation

## 2016-11-27 DIAGNOSIS — M545 Low back pain, unspecified: Secondary | ICD-10-CM | POA: Insufficient documentation

## 2016-11-27 DIAGNOSIS — M62830 Muscle spasm of back: Secondary | ICD-10-CM | POA: Insufficient documentation

## 2016-11-27 DIAGNOSIS — M419 Scoliosis, unspecified: Secondary | ICD-10-CM | POA: Insufficient documentation

## 2016-11-27 DIAGNOSIS — M5136 Other intervertebral disc degeneration, lumbar region: Secondary | ICD-10-CM | POA: Insufficient documentation
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
## 2016-12-05 ENCOUNTER — Other Ambulatory Visit: Payer: Self-pay

## 2016-12-05 DIAGNOSIS — G40909 Epilepsy, unspecified, not intractable, without status epilepticus: Secondary | ICD-10-CM

## 2016-12-07 LAB — PHENYTOIN LEVEL, FREE AND TOTAL: Phenytoin, Free: NOT DETECTED ug/mL (ref 1.0–2.0)

## 2016-12-07 LAB — VALPROIC ACID LEVEL: Valproic Acid Lvl: 8 ug/mL — ABNORMAL LOW (ref 50–100)

## 2016-12-11 ENCOUNTER — Encounter: Payer: Self-pay | Admitting: Chiropractor

## 2016-12-11 ENCOUNTER — Ambulatory Visit: Payer: Self-pay | Admitting: Chiropractor

## 2016-12-11 DIAGNOSIS — M9902 Segmental and somatic dysfunction of thoracic region: Secondary | ICD-10-CM | POA: Insufficient documentation

## 2016-12-11 DIAGNOSIS — M9903 Segmental and somatic dysfunction of lumbar region: Secondary | ICD-10-CM | POA: Insufficient documentation

## 2016-12-11 DIAGNOSIS — M62830 Muscle spasm of back: Secondary | ICD-10-CM

## 2016-12-11 DIAGNOSIS — M545 Low back pain: Secondary | ICD-10-CM
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
## 2016-12-17 ENCOUNTER — Telehealth: Payer: Self-pay | Admitting: Adult Health Nurse Practitioner

## 2016-12-17 NOTE — Telephone Encounter (Signed)
Wanted to reschedule appt. Called back °

## 2016-12-18 ENCOUNTER — Encounter: Payer: Self-pay | Admitting: *Deleted

## 2016-12-18 ENCOUNTER — Ambulatory Visit: Payer: Self-pay | Admitting: Internal Medicine

## 2016-12-18 ENCOUNTER — Emergency Department
Admission: EM | Admit: 2016-12-18 | Discharge: 2016-12-18 | Disposition: A | Discharge status: Home / Self Care | Payer: Medicaid Other | Attending: Emergency Medicine | Admitting: Emergency Medicine

## 2016-12-18 ENCOUNTER — Other Ambulatory Visit: Payer: Self-pay

## 2016-12-18 DIAGNOSIS — Y93G3 Activity, cooking and baking: Secondary | ICD-10-CM | POA: Insufficient documentation

## 2016-12-18 DIAGNOSIS — Z79899 Other long term (current) drug therapy: Secondary | ICD-10-CM | POA: Insufficient documentation

## 2016-12-18 DIAGNOSIS — Y92000 Kitchen of unspecified non-institutional (private) residence as  the place of occurrence of the external cause: Secondary | ICD-10-CM | POA: Diagnosis not present

## 2016-12-18 DIAGNOSIS — T25222A Burn of second degree of left foot, initial encounter: Secondary | ICD-10-CM | POA: Diagnosis not present

## 2016-12-18 DIAGNOSIS — Z8673 Personal history of transient ischemic attack (TIA), and cerebral infarction without residual deficits: Secondary | ICD-10-CM | POA: Diagnosis not present

## 2016-12-18 DIAGNOSIS — Y999 Unspecified external cause status: Secondary | ICD-10-CM | POA: Insufficient documentation

## 2016-12-18 DIAGNOSIS — X118XXA Contact with other hot tap-water, initial encounter: Secondary | ICD-10-CM | POA: Insufficient documentation

## 2016-12-18 DIAGNOSIS — F17210 Nicotine dependence, cigarettes, uncomplicated: Secondary | ICD-10-CM | POA: Insufficient documentation

## 2016-12-18 DIAGNOSIS — J45909 Unspecified asthma, uncomplicated: Secondary | ICD-10-CM | POA: Insufficient documentation

## 2016-12-18 DIAGNOSIS — T24231A Burn of second degree of right lower leg, initial encounter: Secondary | ICD-10-CM | POA: Insufficient documentation

## 2016-12-18 DIAGNOSIS — S8992XA Unspecified injury of left lower leg, initial encounter: Secondary | ICD-10-CM | POA: Diagnosis present

## 2016-12-18 MED ORDER — SILVER SULFADIAZINE 1 % EX CREA
TOPICAL_CREAM | Freq: Once | CUTANEOUS | Status: AC
  Administered 2016-12-18: 1 via TOPICAL

## 2016-12-18 MED ORDER — SILVER SULFADIAZINE 1 % EX CREA: TOPICAL_CREAM | CUTANEOUS | 1 refills | Status: DC

## 2016-12-18 MED ORDER — SILVER SULFADIAZINE 1 % EX CREA
TOPICAL_CREAM | CUTANEOUS | Status: AC
  Filled 2016-12-18: qty 85

## 2016-12-18 MED ORDER — TRAMADOL HCL 50 MG PO TABS
50.0000 mg | ORAL_TABLET | Freq: Once | ORAL | Status: AC
  Administered 2016-12-18: 50 mg via ORAL
  Filled 2016-12-18: qty 1

## 2016-12-18 MED ORDER — TRAMADOL HCL 50 MG PO TABS: 50.0000 mg | ORAL_TABLET | Freq: Four times a day (QID) | ORAL | 0 refills | Status: DC | PRN
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
## 2016-12-24 ENCOUNTER — Telehealth: Payer: Self-pay | Admitting: Pharmacist

## 2016-12-24 NOTE — Telephone Encounter (Signed)
12/24/16 Called [employer] spoke with Luretha RuedKameka and placed refill for Nicotrol Inhaler Cartridge -allow 7-10 business days to receive, order# [id number].Forde RadonAJ

## 2016-12-25 ENCOUNTER — Ambulatory Visit: Payer: Self-pay | Admitting: Chiropractor

## 2017-01-08 ENCOUNTER — Ambulatory Visit: Payer: Self-pay | Admitting: Internal Medicine

## 2017-01-08 VITALS — BP 120/77 | HR 85 | Temp 98.2°F | Wt 100.5 lb

## 2017-01-08 DIAGNOSIS — T25222D Burn of second degree of left foot, subsequent encounter: Secondary | ICD-10-CM

## 2017-01-08 MED ORDER — SILVER SULFADIAZINE 1 % EX CREA: TOPICAL_CREAM | CUTANEOUS | 0 refills | Status: DC
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
## 2017-01-09 ENCOUNTER — Other Ambulatory Visit: Payer: Self-pay

## 2017-01-09 DIAGNOSIS — T300 Burn of unspecified body region, unspecified degree: Secondary | ICD-10-CM

## 2017-01-09 MED ORDER — SILVER SULFADIAZINE 1 % EX CREA: TOPICAL_CREAM | CUTANEOUS | 0 refills | Status: DC

## 2017-01-20 NOTE — Progress Notes (Signed)
Patient left without being seen.

## 2017-02-05 ENCOUNTER — Telehealth: Payer: Self-pay | Admitting: Pharmacist

## 2017-02-05 NOTE — Telephone Encounter (Signed)
02/05/17 Placed refill online for Ventolin HFA, to release 02/18/17, order# [id number].Forde RadonAJ

## 2017-02-12 ENCOUNTER — Telehealth: Payer: Self-pay | Admitting: Pharmacist

## 2017-02-12 NOTE — Telephone Encounter (Signed)
02/12/17 Called [employer] spoke with Coy Saunasosemary to place refill on Nicotrol Inhaler 10mg , allow 7-10 business days to receive, order# [id number], 1 refill left. Patient enrolled till 09/04/17.Forde RadonAJ

## 2017-02-24 ENCOUNTER — Other Ambulatory Visit: Payer: Self-pay | Admitting: Internal Medicine

## 2017-02-24 DIAGNOSIS — T300 Burn of unspecified body region, unspecified degree: Secondary | ICD-10-CM

## 2017-02-24 DIAGNOSIS — R52 Pain, unspecified: Secondary | ICD-10-CM

## 2017-03-13 ENCOUNTER — Ambulatory Visit: Payer: Self-pay

## 2017-03-31 ENCOUNTER — Other Ambulatory Visit: Payer: Self-pay | Admitting: Internal Medicine

## 2017-03-31 DIAGNOSIS — T300 Burn of unspecified body region, unspecified degree: Secondary | ICD-10-CM

## 2017-04-07 ENCOUNTER — Telehealth: Payer: Self-pay | Admitting: Pharmacist

## 2017-04-07 NOTE — Telephone Encounter (Signed)
04/07/17 Placed refill online with [facility] for Ventolin HFA, to release 04/18/17, order# [id number].Forde RadonAJ

## 2017-04-14 ENCOUNTER — Telehealth: Payer: Self-pay | Admitting: Pharmacy Technician

## 2017-04-14 NOTE — Telephone Encounter (Signed)
Patient failed to provide 2019 financial documentation.  No additional medication assistance will be provided by [facility] without the required proof of income documentation.  Patient notified by letter.  [name] Care Manager [facility] 

## 2017-04-21 ENCOUNTER — Other Ambulatory Visit: Payer: Self-pay | Admitting: Internal Medicine

## 2017-04-21 DIAGNOSIS — R52 Pain, unspecified: Secondary | ICD-10-CM

## 2017-04-21 DIAGNOSIS — T300 Burn of unspecified body region, unspecified degree: Secondary | ICD-10-CM

## 2017-04-25 ENCOUNTER — Ambulatory Visit: Payer: Self-pay | Admitting: Pharmacy Technician

## 2017-05-01 ENCOUNTER — Emergency Department
Admission: EM | Admit: 2017-05-01 | Discharge: 2017-05-02 | Disposition: A | Discharge status: Home / Self Care | Payer: Medicare Other | Attending: Emergency Medicine | Admitting: Emergency Medicine

## 2017-05-01 DIAGNOSIS — J45909 Unspecified asthma, uncomplicated: Secondary | ICD-10-CM | POA: Diagnosis not present

## 2017-05-01 DIAGNOSIS — R569 Unspecified convulsions: Secondary | ICD-10-CM | POA: Diagnosis not present

## 2017-05-01 DIAGNOSIS — F17210 Nicotine dependence, cigarettes, uncomplicated: Secondary | ICD-10-CM | POA: Insufficient documentation

## 2017-05-01 DIAGNOSIS — Z3202 Encounter for pregnancy test, result negative: Secondary | ICD-10-CM | POA: Diagnosis not present

## 2017-05-01 DIAGNOSIS — Z8673 Personal history of transient ischemic attack (TIA), and cerebral infarction without residual deficits: Secondary | ICD-10-CM | POA: Insufficient documentation

## 2017-05-01 DIAGNOSIS — G40909 Epilepsy, unspecified, not intractable, without status epilepticus: Secondary | ICD-10-CM

## 2017-05-01 LAB — URINE DRUG SCREEN, QUALITATIVE (ARMC ONLY)
Amphetamines, Ur Screen: NOT DETECTED
BARBITURATES, UR SCREEN: NOT DETECTED
Benzodiazepine, Ur Scrn: NOT DETECTED
CANNABINOID 50 NG, UR ~~LOC~~: NOT DETECTED
Cocaine Metabolite,Ur ~~LOC~~: NOT DETECTED
MDMA (Ecstasy)Ur Screen: NOT DETECTED
Methadone Scn, Ur: NOT DETECTED
Opiate, Ur Screen: NOT DETECTED
Phencyclidine (PCP) Ur S: NOT DETECTED
TRICYCLIC, UR SCREEN: NOT DETECTED

## 2017-05-01 LAB — URINALYSIS, COMPLETE (UACMP) WITH MICROSCOPIC
BILIRUBIN URINE: NEGATIVE
Bacteria, UA: NONE SEEN
Glucose, UA: NEGATIVE mg/dL
Hgb urine dipstick: NEGATIVE
Ketones, ur: NEGATIVE mg/dL
LEUKOCYTES UA: NEGATIVE
Nitrite: NEGATIVE
Protein, ur: NEGATIVE mg/dL
RBC / HPF: NONE SEEN RBC/hpf (ref 0–5)
SPECIFIC GRAVITY, URINE: 1.003 — AB (ref 1.005–1.030)
pH: 5 (ref 5.0–8.0)

## 2017-05-01 LAB — BASIC METABOLIC PANEL
Anion gap: 13 (ref 5–15)
BUN: 8 mg/dL (ref 6–20)
CHLORIDE: 103 mmol/L (ref 101–111)
CO2: 21 mmol/L — AB (ref 22–32)
CREATININE: 0.63 mg/dL (ref 0.44–1.00)
Calcium: 9.3 mg/dL (ref 8.9–10.3)
GFR calc Af Amer: >60 mL/min (ref >60)
GFR calc non Af Amer: >60 mL/min (ref >60)
Glucose, Bld: 76 mg/dL (ref 65–99)
POTASSIUM: 3.8 mmol/L (ref 3.5–5.1)
SODIUM: 137 mmol/L (ref 135–145)

## 2017-05-01 LAB — CBC
HEMATOCRIT: 40.5 % (ref 35.0–47.0)
Hemoglobin: 13.8 g/dL (ref 12.0–16.0)
MCH: 31.9 pg (ref 26.0–34.0)
MCHC: 34 g/dL (ref 32.0–36.0)
MCV: 93.6 fL (ref 80.0–100.0)
Platelets: 188 10*3/uL (ref 150–440)
RBC: 4.33 MIL/uL (ref 3.80–5.20)
RDW: 13.1 % (ref 11.5–14.5)
WBC: 6.1 10*3/uL (ref 3.6–11.0)

## 2017-05-01 LAB — PHENYTOIN LEVEL, TOTAL

## 2017-05-01 LAB — GLUCOSE, CAPILLARY: Glucose-Capillary: 72 mg/dL (ref 65–99)

## 2017-05-01 LAB — ETHANOL: Alcohol, Ethyl (B): 108 mg/dL — ABNORMAL HIGH (ref <10)

## 2017-05-01 MED ORDER — LORAZEPAM 2 MG/ML IJ SOLN
0.5000 mg | Freq: Once | INTRAMUSCULAR | Status: AC
  Administered 2017-05-01: 0.5 mg via INTRAVENOUS

## 2017-05-01 MED ORDER — PHENYTOIN SODIUM EXTENDED 100 MG PO CAPS
400.0000 mg | ORAL_CAPSULE | Freq: Once | ORAL | Status: AC
  Administered 2017-05-01: 400 mg via ORAL
  Filled 2017-05-01: qty 4

## 2017-05-01 MED ORDER — SODIUM CHLORIDE 0.9 % IV BOLUS
1000.0000 mL | Freq: Once | INTRAVENOUS | Status: AC
  Administered 2017-05-01: 1000 mL via INTRAVENOUS

## 2017-05-01 NOTE — ED Triage Notes (Signed)
Patient reports she has been out of her seizure medication (dilantin and depakote) for 2 days. Patient c/o dizziness, right leg pain, N/V; reports 1 emesis today.

## 2017-05-01 NOTE — ED Provider Notes (Signed)
[facility] Emergency Department Provider Note  ____________________________________________  Time seen: Approximately 10:07 PM  I have reviewed the triage vital signs and the nursing notes.   HISTORY  Chief Complaint Dizziness    HPI [name] is a 52 y.o. female with a history of schizophrenia and bipolar disorder, seizure disorder on Depakote and Dilantin, CVA, drug abuse, presenting for medication noncompliance and the sensation like she might have a seizure.  I was called to the patient's room for seizure and the patient was unable to give any history.  However, her nurse did speak with her and I was able to speak with a man who is accompanying her, and per report she has not had any seizure medications for the past 2 days, and felt the sensation like she might have a seizure.  She has not had any other illness.  Past Medical History:  Diagnosis Date  . Allergy   . Anemia   . Arthritis   . Asthma    NO INHALERS  . Bipolar disorder (HCC)   . Cocaine abuse in remission (HCC)   . Depression   . Dyspnea   . Eczema   . Nervous   . Schizophrenia (HCC)   . Scoliosis   . Seizure (HCC)   . Seizures (HCC)    Last seizure, May 20, 2016-PT WAS ALONE-UNSURE LENGTH OF SEIZURE-SCRAPED NOSE AND LIP  . Stroke [facility]) 2007  . Tobacco use     Patient Active Problem List   Diagnosis Date Noted  . Somatic dysfunction of spine, lumbar 12/11/2016  . Somatic dysfunction of spine, thoracic 12/11/2016  . Low back pain 11/27/2016  . Spasm of muscle of lower back 11/27/2016  . Mass involving both sides of chest wall   . Cyst of skin of breast 03/18/2016  . Right wrist pain 03/15/2016  . Eczema 05/16/2015  . Cocaine abuse in remission (HCC) 08/22/2014  . Seizure disorder (HCC) 08/21/2014    Past Surgical History:  Procedure Laterality Date  . APPENDECTOMY  2000  . BACK SURGERY  2012  . BREAST CYST EXCISION Bilateral 05/29/2016   Procedure: MASS EXCISION  CHEST WALL;  Surgeon: Ricarda Frame, MD;  Location: [facility] ORS;  Service: General;  Laterality: Bilateral;  . DILATION AND CURETTAGE OF UTERUS      Current Outpatient Rx  . Order #: [id number] Class: Normal  . Order #: [id number] Class: Normal  . Order #: [id number] Class: Normal  . Order #: [id number] Class: Normal  . Order #: [id number] Class: Normal  . Order #: [id number] Class: Normal  . Order #: [id number] Class: Normal  . Order #: [id number] Class: Print  . Order #: [id number] Class: Normal    Allergies Flagyl [metronidazole]  Family History  Problem Relation Age of Onset  . Epilepsy Mother   . Seizures Mother   . Heart disease Father   . Epilepsy Maternal Grandmother   . Heart attack Maternal Grandmother     Social History Social History   Tobacco Use  . Smoking status: Current Every Day Smoker    Packs/day: 0.75    Years: 47.00    Pack years: 35.25    Types: Cigarettes  . Smokeless tobacco: Never Used  . Tobacco comment: Interested in resources   Substance Use Topics  . Alcohol use: Yes    Alcohol/week: 29.4 oz    Types: 49 Cans of beer per week  . Drug use: No    Types: Cocaine    Review of Systems Unable to obtain due to  change in patient's mental status after her seizure.  ____________________________________________   PHYSICAL EXAM:  VITAL SIGNS: ED Triage Vitals [05/01/17 2144]  Enc Vitals Group     BP (!) 139/98     Pulse Rate 94     Resp 17     Temp 98.1 F (36.7 C)     Temp Source Oral     SpO2 98 %     Weight 99 lb (44.9 kg)     Height      Head Circumference      Peak Flow      Pain Score 7     Pain Loc      Pain Edu?      Excl. in GC?     Constitutional: The patient is unresponsive and tense without any tonic-clonic activity.  She is protecting her airway and has normal oxygen saturation. Eyes: Conjunctivae are normal.  EOMI. No scleral icterus. Head: Atraumatic. Nose: No congestion/rhinnorhea. Mouth/Throat: Mucous membranes are moist.   Neck: No stridor.  Supple.  No JVD.  No meningismus. Cardiovascular: Normal rate, regular rhythm. No murmurs, rubs or gallops.  Respiratory: Normal respiratory effort.  No accessory muscle use or retractions. Lungs CTAB.  No wheezes, rales or ronchi. Gastrointestinal: Soft, nontender and nondistended.  No guarding or rebound.  No peritoneal signs. Musculoskeletal: No LE edema. No ttp in the calves or palpable cords.  Negative Homan's sign. Neurologic: the patient has a full body tenseness without any tonic-clonic activity with no urinary or fecal incontinence.  She is not responsive for 2-3 minutes, and then her body is able to relax, she is able to open her eyes but she does not follow commands. Skin:  Skin is warm, dry and intact. No rash noted. Psychiatric: Unable to assess  ____________________________________________   LABS (all labs ordered are listed, but only abnormal results are displayed)  Labs Reviewed  BASIC METABOLIC PANEL  CBC  URINALYSIS, COMPLETE (UACMP) WITH MICROSCOPIC  PHENYTOIN LEVEL, TOTAL  VALPROIC ACID LEVEL  URINE DRUG SCREEN, QUALITATIVE ([facility] ONLY)  ETHANOL  POC URINE PREG, ED  CBG MONITORING, ED   ____________________________________________  EKG  ED ECG REPORT I, Rockne Menghini, the attending physician, personally viewed and interpreted this ECG.   Date: 05/01/2017  EKG Time: 2147  Rate: 95  Rhythm: normal sinus rhythm  Axis: normal  Intervals:none  ST&T Change: No STEMI  ____________________________________________  RADIOLOGY  No results found.  ____________________________________________   PROCEDURES  Procedure(s) performed: None  Procedures  Critical Care performed: No ____________________________________________   INITIAL IMPRESSION / ASSESSMENT AND PLAN / ED COURSE  Pertinent labs & imaging results that were available during my care of the patient were reviewed by me and considered in my medical decision making  (see chart for details).  52 y.o. female with a known history of seizure disorder and presenting with medication noncompliance and the sensation like she might have a seizure, now with tonic activity and unresponsiveness but reassuring vital signs.  Overall, the patient is likely having a seizure from medication noncompliance and will check her Dilantin and Depakote levels.  She has received 0.5 mg of Ativan today.  We will continue to monitor the patient for any evolving symptoms.  Patient will be signed out to the oncoming physician for further evaluation and treatment and final disposition.  ____________________________________________  FINAL CLINICAL IMPRESSION(S) / ED DIAGNOSES  Final diagnoses:  Seizure (HCC)         NEW MEDICATIONS STARTED DURING THIS VISIT:  New Prescriptions   No medications on file      Rockne MenghiniNorman, Anne-Caroline, MD 05/01/17 2250

## 2017-05-01 NOTE — ED Notes (Signed)
Pt started having seizure like activity, pt placed on side, suctioned mouth, and put on 15L NRB.  EDP and other staff aware and seizure pads placed after medication administered for seizures.

## 2017-05-01 NOTE — ED Notes (Signed)
Pt alert at this time and comfortable, no postictal period noted after seizure-like activity.

## 2017-05-02 LAB — PREGNANCY, URINE: PREG TEST UR: NEGATIVE

## 2017-05-02 LAB — VALPROIC ACID LEVEL: Valproic Acid Lvl: <10 ug/mL — ABNORMAL LOW (ref 50.0–100.0)

## 2017-05-02 MED ORDER — PHENYTOIN SODIUM EXTENDED 100 MG PO CAPS
300.0000 mg | ORAL_CAPSULE | Freq: Once | ORAL | Status: AC
  Administered 2017-05-02: 300 mg via ORAL
  Filled 2017-05-02: qty 3

## 2017-05-02 MED ORDER — DIVALPROEX SODIUM 500 MG PO DR TAB: 500.0000 mg | DELAYED_RELEASE_TABLET | Freq: Three times a day (TID) | ORAL | 0 refills | Status: DC

## 2017-05-02 MED ORDER — DIVALPROEX SODIUM 500 MG PO DR TAB
500.0000 mg | DELAYED_RELEASE_TABLET | Freq: Once | ORAL | Status: AC
  Administered 2017-05-02: 500 mg via ORAL
  Filled 2017-05-02: qty 1

## 2017-05-02 MED ORDER — PHENYTOIN SODIUM EXTENDED 100 MG PO CAPS: 100.0000 mg | ORAL_CAPSULE | Freq: Three times a day (TID) | ORAL | 0 refills | Status: DC

## 2017-05-02 NOTE — Discharge Instructions (Signed)
Please resume taking both your Depakote and your Dilantin as prescribed and make an appointment to follow-up with the seizure specialist within the next week or 2.  Return to the emergency department sooner for any concerns.  It was a pleasure to take care of you today, and thank you for coming to our emergency department.  If you have any questions or concerns before leaving please ask the nurse to grab me and I'm more than happy to go through your aftercare instructions again.  If you were prescribed any opioid pain medication today such as Norco, Vicodin, Percocet, morphine, hydrocodone, or oxycodone please make sure you do not drive when you are taking this medication as it can alter your ability to drive safely.  If you have any concerns once you are home that you are not improving or are in fact getting worse before you can make it to your follow-up appointment, please do not hesitate to call 911 and come back for further evaluation.  Shelly BrittleNeil Brody Kump, MD  Results for orders placed or performed during the hospital encounter of 05/01/17  Basic metabolic panel  Result Value Ref Range   Sodium 137 135 - 145 mmol/L   Potassium 3.8 3.5 - 5.1 mmol/L   Chloride 103 101 - 111 mmol/L   CO2 21 (L) 22 - 32 mmol/L   Glucose, Bld 76 65 - 99 mg/dL   BUN 8 6 - 20 mg/dL   Creatinine, Ser 1.610.63 0.44 - 1.00 mg/dL   Calcium 9.3 8.9 - 09.610.3 mg/dL   GFR calc non Af Amer >60 >60 mL/min   GFR calc Af Amer >60 >60 mL/min   Anion gap 13 5 - 15  CBC  Result Value Ref Range   WBC 6.1 3.6 - 11.0 K/uL   RBC 4.33 3.80 - 5.20 MIL/uL   Hemoglobin 13.8 12.0 - 16.0 g/dL   HCT 04.540.5 40.935.0 - 81.147.0 %   MCV 93.6 80.0 - 100.0 fL   MCH 31.9 26.0 - 34.0 pg   MCHC 34.0 32.0 - 36.0 g/dL   RDW 91.413.1 78.211.5 - 95.614.5 %   Platelets 188 150 - 440 K/uL  Urinalysis, Complete w Microscopic  Result Value Ref Range   Color, Urine STRAW (A) YELLOW   APPearance CLEAR (A) CLEAR   Specific Gravity, Urine 1.003 (L) 1.005 - 1.030   pH 5.0  5.0 - 8.0   Glucose, UA NEGATIVE NEGATIVE mg/dL   Hgb urine dipstick NEGATIVE NEGATIVE   Bilirubin Urine NEGATIVE NEGATIVE   Ketones, ur NEGATIVE NEGATIVE mg/dL   Protein, ur NEGATIVE NEGATIVE mg/dL   Nitrite NEGATIVE NEGATIVE   Leukocytes, UA NEGATIVE NEGATIVE   RBC / HPF NONE SEEN 0 - 5 RBC/hpf   WBC, UA 0-5 0 - 5 WBC/hpf   Bacteria, UA NONE SEEN NONE SEEN   Squamous Epithelial / LPF 0-5 (A) NONE SEEN   Mucus PRESENT   Dilantin (phenytoin) Level (if patient is taking this medication)  Result Value Ref Range   Phenytoin Lvl <2.5 (L) 10.0 - 20.0 ug/mL  Urine Drug Screen, Qualitative ([facility] only)  Result Value Ref Range   Tricyclic, Ur Screen NONE DETECTED NONE DETECTED   Amphetamines, Ur Screen NONE DETECTED NONE DETECTED   MDMA (Ecstasy)Ur Screen NONE DETECTED NONE DETECTED   Cocaine Metabolite,Ur [place] NONE DETECTED NONE DETECTED   Opiate, Ur Screen NONE DETECTED NONE DETECTED   Phencyclidine (PCP) Ur S NONE DETECTED NONE DETECTED   Cannabinoid 50 Ng, Ur [place] NONE DETECTED NONE  DETECTED   Barbiturates, Ur Screen NONE DETECTED NONE DETECTED   Benzodiazepine, Ur Scrn NONE DETECTED NONE DETECTED   Methadone Scn, Ur NONE DETECTED NONE DETECTED  Ethanol  Result Value Ref Range   Alcohol, Ethyl (B) 108 (H) <10 mg/dL  Pregnancy, urine  Result Value Ref Range   Preg Test, Ur NEGATIVE NEGATIVE  Glucose, capillary  Result Value Ref Range   Glucose-Capillary 72 65 - 99 mg/dL   No results found.

## 2017-05-02 NOTE — ED Provider Notes (Signed)
Oral Dilantin load complete.  Stable for discharge with medication refill.  The patient verbalizes understanding and agreement the plan.   Merrily Brittleifenbark, Jeannifer Drakeford, MD 05/02/17 [phone]

## 2017-05-05 ENCOUNTER — Ambulatory Visit: Payer: Self-pay | Admitting: Pharmacy Technician

## 2017-05-05 NOTE — Progress Notes (Signed)
Patient scheduled for re-certification appointment at [facility]. Patient did not show for the appointment on May 05, 2017 at 9:00a.m.  Contacted patient via phone.  Patient indicated that she has been approved for Medicare A & B effective April 04, 2017.  Instructed patient to contact DSS to be screened for the Medicare Savings Program. Patient is to come in to see me on Wednesday, 05/07/17 to complete the L.I.S. application. [facility] that [facility] unable to provide medication assistance for certain medications since patient has been approved for Medicare.  Patient acknowledged that she understood.  Sherilyn DacostaBetty J. Jhanae Jaskowiak Care Manager [facility]

## 2017-05-06 ENCOUNTER — Telehealth: Payer: Self-pay | Admitting: Pharmacy Technician

## 2017-05-06 NOTE — Telephone Encounter (Signed)
Patient eligible for Medicare Part D beginning July 05, 2017.  No medication assistance will be provided patient after Jul 04, 2017.  Patient acknowledged that she understood.  Shelly DacostaBetty J. Akiah [name] Care Manager [facility]

## 2017-05-12 ENCOUNTER — Telehealth: Payer: Self-pay | Admitting: Pharmacist

## 2017-05-12 NOTE — Telephone Encounter (Signed)
05/12/2017 10:04:31 AM - Ventolin HFA  05/12/17 I have received a letter from [employer] stating patient enrollment ends 07/03/17. We have 3 inhalers in a bag on the wall for patient to pick up-3 month supply, also a letter in chart and in [facility] patient eligible for Medicare D eff: 07/05/17. I am not re enrolling patient with [employer] at this time.AJ  05/06/2017 3:21:17 PM - Eligibility  05/06/17 BJK-Pt provided 2019 poi. Eligible for Medicare Part D beginning 07/05/17.

## 2017-05-14 IMAGING — CT CT HEAD W/O CM
1 series · 15 of 30 positions shown, 19 images · non-contrast
Comparison: CT of the head performed 08/21/2014

CLINICAL DATA: Acute onset of seizure.  Initial encounter.

EXAM:
CT HEAD WITHOUT CONTRAST
TECHNIQUE: Contiguous axial images were obtained from the base of the skull
through the vertex without intravenous contrast.

[Series 2: head wo · axial · 0.41mm/px · z∈[-188,-62]mm · 15 of 32 slices shown, 19 images]
[im 2/32  brain]
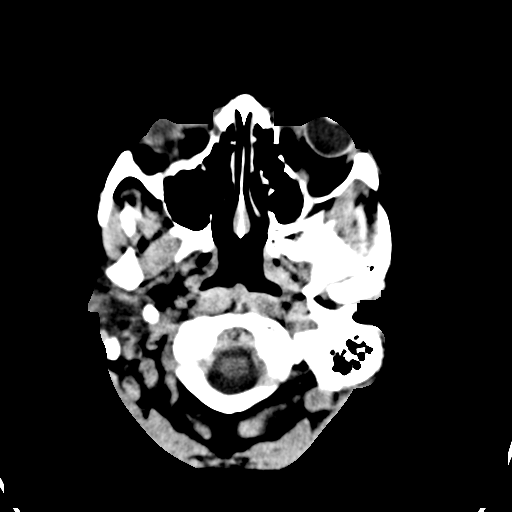
[im 2/32  bone]
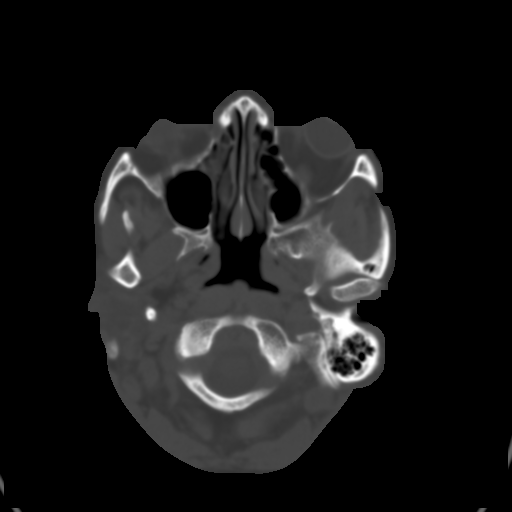
[im 4/32  brain]
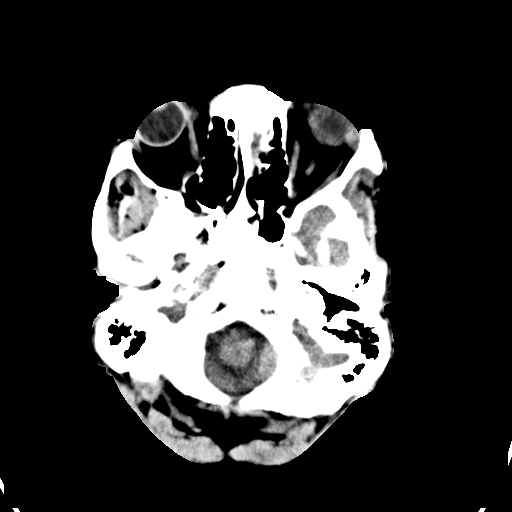
[im 6/32  brain]
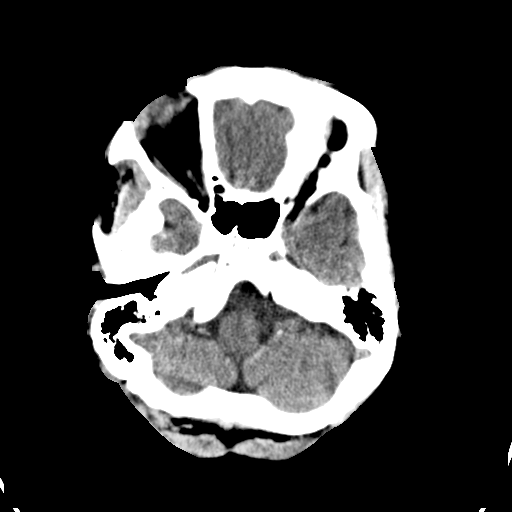
[im 8/32  brain]
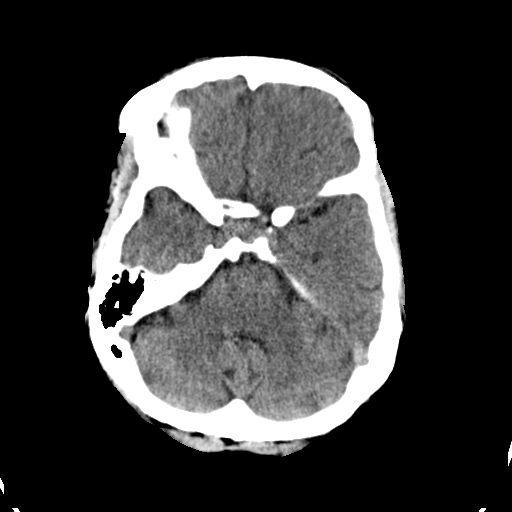
[im 10/32  brain]
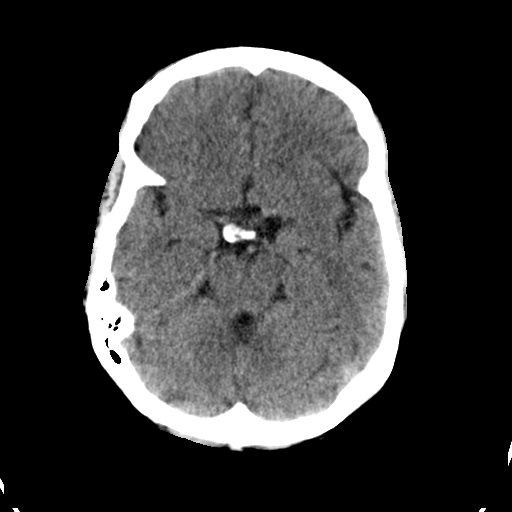
[im 10/32  bone]
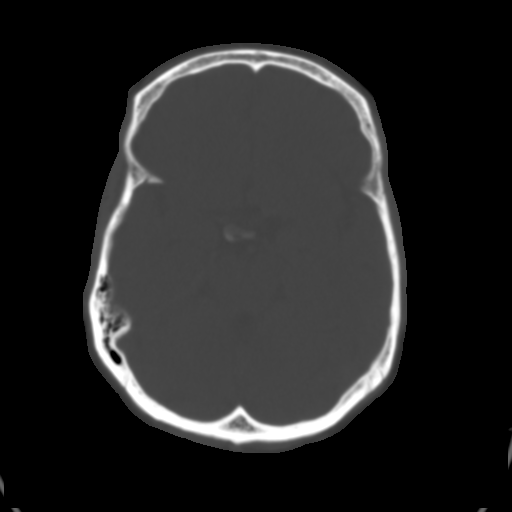
[im 12/32  brain]
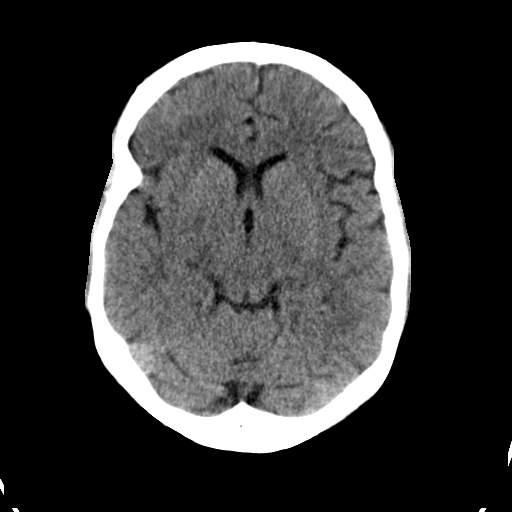
[im 14/32  brain]
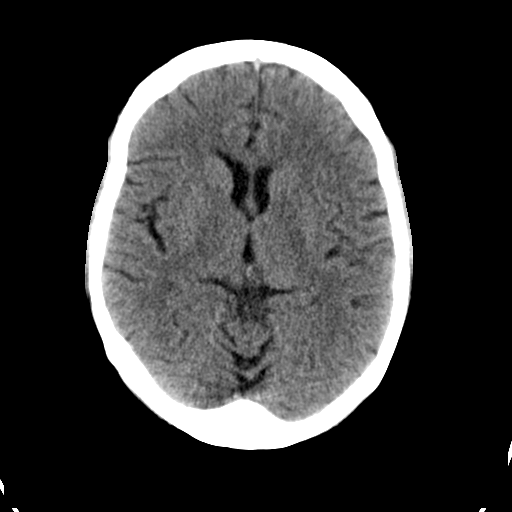
[im 17/32  brain]
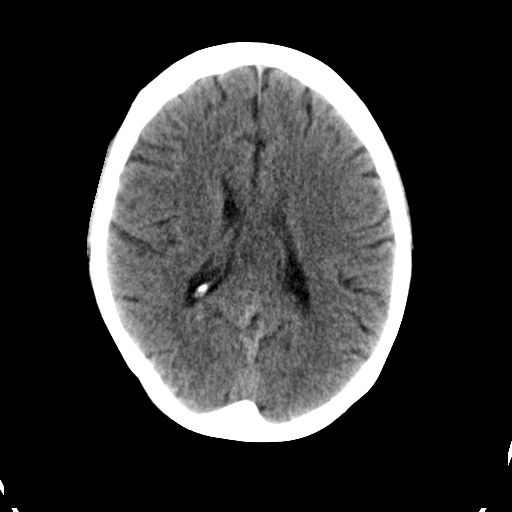
[im 18/32  brain]
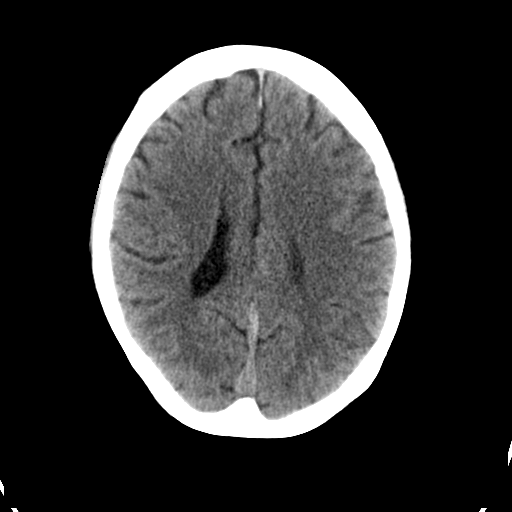
[im 18/32  bone]
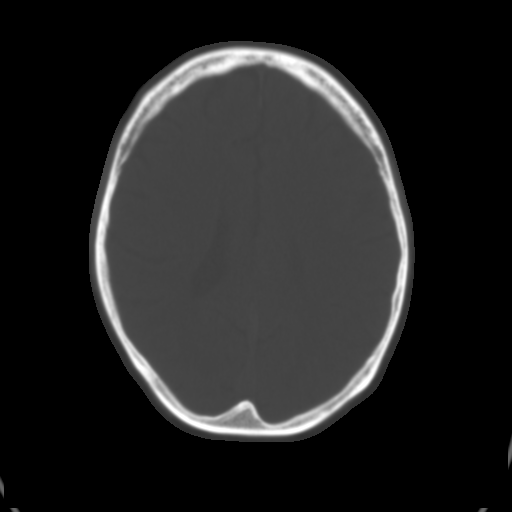
[im 20/32  brain]
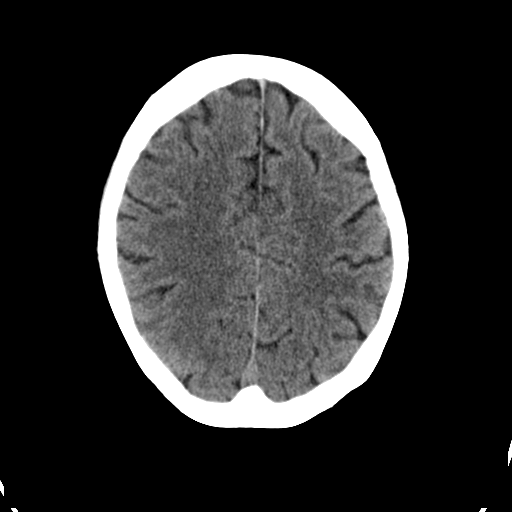
[im 22/32  brain]
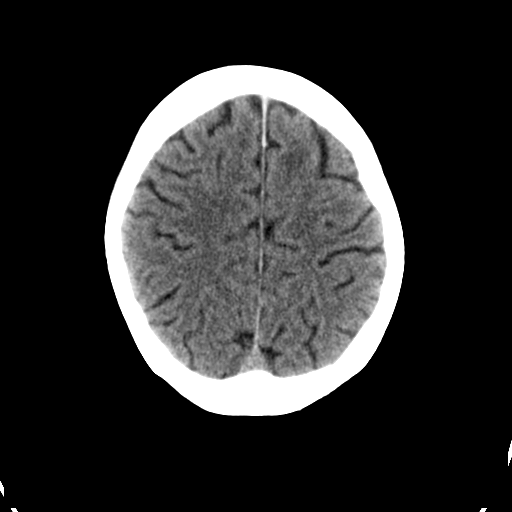
[im 24/32  brain]
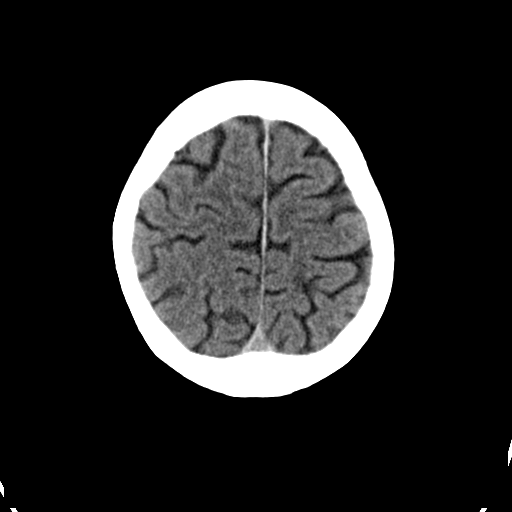
[im 26/32  brain]
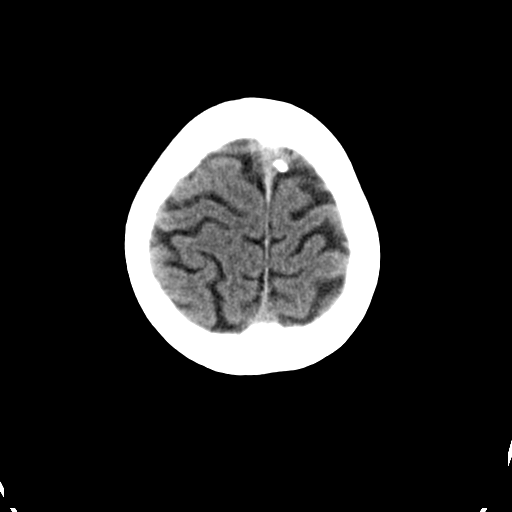
[im 26/32  bone]
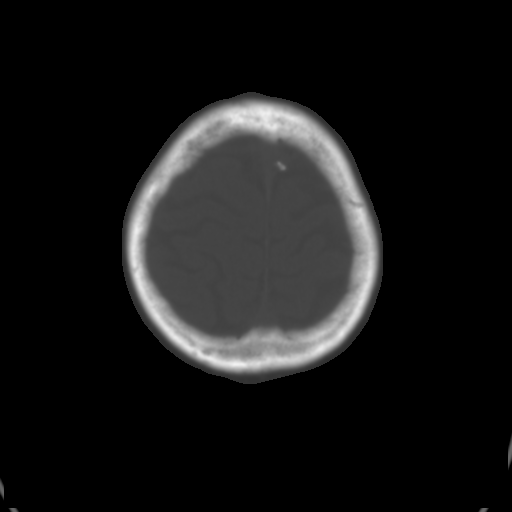
[im 28/32  brain]
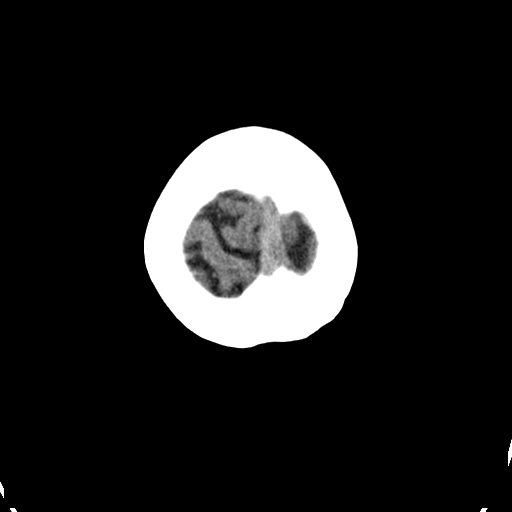
[im 30/32  brain]
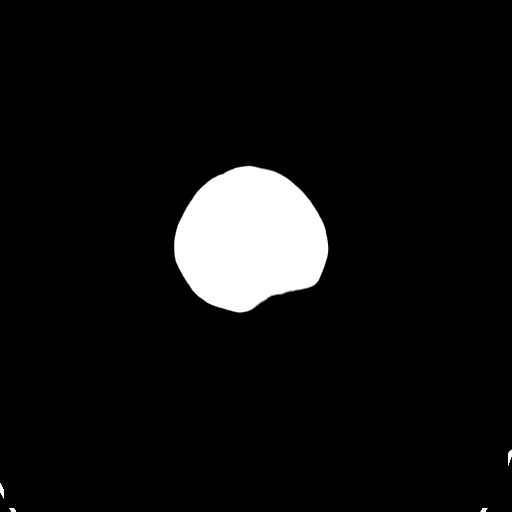

[15 of 30 positions shown; findings below may reference images not displayed]

FINDINGS: There is no evidence of acute infarction, mass lesion, or intra- or
extra-axial hemorrhage on CT.

Mild subcortical white matter change at the high left frontoparietal
region may reflect a small chronic infarct, stable from the prior
studies.

The posterior fossa, including the cerebellum, brainstem and fourth
ventricle, is within normal limits. The third and lateral
ventricles, and basal ganglia are unremarkable in appearance. No
mass effect or midline shift is seen.

There is no evidence of fracture; visualized osseous structures are
unremarkable in appearance. The visualized portions of the orbits
are within normal limits. The paranasal sinuses and mastoid air
cells are well-aerated. No significant soft tissue abnormalities are
seen.
IMPRESSION: 1. No acute intracranial pathology seen on CT.
2. Mild focal subcortical white matter change at the high left
frontoparietal region may reflect a small chronic white matter
infarct, stable from the prior studies.

## 2017-05-20 ENCOUNTER — Telehealth: Payer: Self-pay | Admitting: Pharmacist

## 2017-05-20 NOTE — Telephone Encounter (Signed)
05/20/2017 9:37:36 AM - Nicotrol Inhaler-[employer] Invoice  05/20/17 Received a copy of [employer] invoice dated 04/07/17 where we received 1 box of Nicotrol Inhaler-note on invoice Not Dispensed-due to patient did not pick up.Forde RadonAJ

## 2017-07-14 DIAGNOSIS — F39 Unspecified mood [affective] disorder: Secondary | ICD-10-CM | POA: Insufficient documentation

## 2017-07-14 DIAGNOSIS — F319 Bipolar disorder, unspecified: Secondary | ICD-10-CM | POA: Insufficient documentation

## 2017-08-14 ENCOUNTER — Observation Stay
Admission: EM | Admit: 2017-08-14 | Discharge: 2017-08-15 | Disposition: A | Discharge status: Home / Self Care | Payer: Medicare HMO | Attending: Internal Medicine | Admitting: Internal Medicine

## 2017-08-14 ENCOUNTER — Emergency Department: Payer: Medicare HMO

## 2017-08-14 ENCOUNTER — Observation Stay: Payer: Medicare HMO

## 2017-08-14 ENCOUNTER — Other Ambulatory Visit: Payer: Self-pay

## 2017-08-14 ENCOUNTER — Encounter: Payer: Self-pay | Admitting: Emergency Medicine

## 2017-08-14 DIAGNOSIS — F1410 Cocaine abuse, uncomplicated: Secondary | ICD-10-CM | POA: Insufficient documentation

## 2017-08-14 DIAGNOSIS — G4700 Insomnia, unspecified: Secondary | ICD-10-CM | POA: Diagnosis not present

## 2017-08-14 DIAGNOSIS — R079 Chest pain, unspecified: Secondary | ICD-10-CM | POA: Diagnosis not present

## 2017-08-14 DIAGNOSIS — R531 Weakness: Secondary | ICD-10-CM

## 2017-08-14 DIAGNOSIS — I639 Cerebral infarction, unspecified: Secondary | ICD-10-CM

## 2017-08-14 DIAGNOSIS — G8384 Todd's paralysis (postepileptic): Secondary | ICD-10-CM | POA: Diagnosis not present

## 2017-08-14 DIAGNOSIS — Z79899 Other long term (current) drug therapy: Secondary | ICD-10-CM | POA: Diagnosis not present

## 2017-08-14 DIAGNOSIS — Z881 Allergy status to other antibiotic agents status: Secondary | ICD-10-CM | POA: Diagnosis not present

## 2017-08-14 DIAGNOSIS — I6523 Occlusion and stenosis of bilateral carotid arteries: Secondary | ICD-10-CM | POA: Diagnosis not present

## 2017-08-14 DIAGNOSIS — Z8249 Family history of ischemic heart disease and other diseases of the circulatory system: Secondary | ICD-10-CM | POA: Insufficient documentation

## 2017-08-14 DIAGNOSIS — M419 Scoliosis, unspecified: Secondary | ICD-10-CM | POA: Insufficient documentation

## 2017-08-14 DIAGNOSIS — J45909 Unspecified asthma, uncomplicated: Secondary | ICD-10-CM | POA: Insufficient documentation

## 2017-08-14 DIAGNOSIS — G40909 Epilepsy, unspecified, not intractable, without status epilepticus: Secondary | ICD-10-CM | POA: Diagnosis not present

## 2017-08-14 DIAGNOSIS — Z7982 Long term (current) use of aspirin: Secondary | ICD-10-CM | POA: Diagnosis not present

## 2017-08-14 DIAGNOSIS — M1990 Unspecified osteoarthritis, unspecified site: Secondary | ICD-10-CM | POA: Diagnosis not present

## 2017-08-14 DIAGNOSIS — F259 Schizoaffective disorder, unspecified: Secondary | ICD-10-CM | POA: Insufficient documentation

## 2017-08-14 DIAGNOSIS — Z7289 Other problems related to lifestyle: Secondary | ICD-10-CM | POA: Diagnosis not present

## 2017-08-14 DIAGNOSIS — F17210 Nicotine dependence, cigarettes, uncomplicated: Secondary | ICD-10-CM | POA: Diagnosis not present

## 2017-08-14 DIAGNOSIS — F319 Bipolar disorder, unspecified: Secondary | ICD-10-CM | POA: Diagnosis not present

## 2017-08-14 DIAGNOSIS — R569 Unspecified convulsions: Secondary | ICD-10-CM

## 2017-08-14 DIAGNOSIS — Z820 Family history of epilepsy and other diseases of the nervous system: Secondary | ICD-10-CM | POA: Diagnosis not present

## 2017-08-14 LAB — CBC
HEMATOCRIT: 39.2 % (ref 35.0–47.0)
Hemoglobin: 13.5 g/dL (ref 12.0–16.0)
MCH: 32.4 pg (ref 26.0–34.0)
MCHC: 34.4 g/dL (ref 32.0–36.0)
MCV: 94.2 fL (ref 80.0–100.0)
Platelets: 157 10*3/uL (ref 150–440)
RBC: 4.16 MIL/uL (ref 3.80–5.20)
RDW: 13.2 % (ref 11.5–14.5)
WBC: 5.7 10*3/uL (ref 3.6–11.0)

## 2017-08-14 LAB — URINE DRUG SCREEN, QUALITATIVE (ARMC ONLY)
AMPHETAMINES, UR SCREEN: NOT DETECTED
Benzodiazepine, Ur Scrn: NOT DETECTED
COCAINE METABOLITE, UR ~~LOC~~: POSITIVE — AB
Cannabinoid 50 Ng, Ur ~~LOC~~: NOT DETECTED
MDMA (ECSTASY) UR SCREEN: NOT DETECTED
METHADONE SCREEN, URINE: NOT DETECTED
OPIATE, UR SCREEN: POSITIVE — AB
Phencyclidine (PCP) Ur S: NOT DETECTED
TRICYCLIC, UR SCREEN: NOT DETECTED

## 2017-08-14 LAB — BASIC METABOLIC PANEL
Anion gap: 12 (ref 5–15)
BUN: 8 mg/dL (ref 6–20)
CALCIUM: 9.2 mg/dL (ref 8.9–10.3)
CO2: 23 mmol/L (ref 22–32)
Chloride: 105 mmol/L (ref 98–111)
Creatinine, Ser: 0.47 mg/dL (ref 0.44–1.00)
GFR calc Af Amer: >60 mL/min (ref >60)
GLUCOSE: 88 mg/dL (ref 70–99)
Potassium: 4.1 mmol/L (ref 3.5–5.1)
Sodium: 140 mmol/L (ref 135–145)

## 2017-08-14 LAB — TROPONIN I

## 2017-08-14 LAB — VALPROIC ACID LEVEL: Valproic Acid Lvl: 19 ug/mL — ABNORMAL LOW (ref 50.0–100.0)

## 2017-08-14 LAB — PHENYTOIN LEVEL, TOTAL: Phenytoin Lvl: <2.5 ug/mL — ABNORMAL LOW (ref 10.0–20.0)

## 2017-08-14 MED ORDER — DIVALPROEX SODIUM 500 MG PO DR TAB
500.0000 mg | DELAYED_RELEASE_TABLET | Freq: Every morning | ORAL | Status: DC
  Administered 2017-08-15: 500 mg via ORAL
  Filled 2017-08-14: qty 1

## 2017-08-14 MED ORDER — DIVALPROEX SODIUM 500 MG PO DR TAB: 500.0000 mg | DELAYED_RELEASE_TABLET | Freq: Two times a day (BID) | ORAL | Status: DC

## 2017-08-14 MED ORDER — ASPIRIN 300 MG RE SUPP: 300.0000 mg | Freq: Every day | RECTAL | Status: DC

## 2017-08-14 MED ORDER — MORPHINE SULFATE (PF) 4 MG/ML IV SOLN
4.0000 mg | Freq: Once | INTRAVENOUS | Status: AC
  Administered 2017-08-14: 4 mg via INTRAVENOUS
  Filled 2017-08-14: qty 1

## 2017-08-14 MED ORDER — IBUPROFEN 400 MG PO TABS
800.0000 mg | ORAL_TABLET | Freq: Three times a day (TID) | ORAL | Status: DC | PRN
  Administered 2017-08-14: 21:00:00 800 mg via ORAL
  Filled 2017-08-14: qty 2

## 2017-08-14 MED ORDER — DIVALPROEX SODIUM 500 MG PO DR TAB
500.0000 mg | DELAYED_RELEASE_TABLET | Freq: Once | ORAL | Status: AC
  Administered 2017-08-14: 500 mg via ORAL
  Filled 2017-08-14: qty 1

## 2017-08-14 MED ORDER — TRAZODONE HCL 50 MG PO TABS
75.0000 mg | ORAL_TABLET | Freq: Every day | ORAL | Status: DC
  Administered 2017-08-14: 75 mg via ORAL
  Filled 2017-08-14 (×2): qty 2

## 2017-08-14 MED ORDER — ACETAMINOPHEN 325 MG PO TABS
650.0000 mg | ORAL_TABLET | ORAL | Status: DC | PRN
  Administered 2017-08-14 – 2017-08-15 (×2): 650 mg via ORAL
  Filled 2017-08-14 (×2): qty 2

## 2017-08-14 MED ORDER — DIVALPROEX SODIUM 500 MG PO DR TAB
1000.0000 mg | DELAYED_RELEASE_TABLET | Freq: Every day | ORAL | Status: DC
  Administered 2017-08-14: 23:00:00 1000 mg via ORAL
  Filled 2017-08-14 (×2): qty 2

## 2017-08-14 MED ORDER — ATORVASTATIN CALCIUM 20 MG PO TABS
40.0000 mg | ORAL_TABLET | Freq: Every day | ORAL | Status: DC
  Administered 2017-08-14: 18:00:00 40 mg via ORAL
  Filled 2017-08-14: qty 2

## 2017-08-14 MED ORDER — ACETAMINOPHEN 650 MG RE SUPP: 650.0000 mg | RECTAL | Status: DC | PRN

## 2017-08-14 MED ORDER — ZOLPIDEM TARTRATE 5 MG PO TABS: 5.0000 mg | ORAL_TABLET | Freq: Every evening | ORAL | Status: DC | PRN

## 2017-08-14 MED ORDER — STROKE: EARLY STAGES OF RECOVERY BOOK
Freq: Once | Status: AC
  Administered 2017-08-14: 18:00:00

## 2017-08-14 MED ORDER — SENNOSIDES-DOCUSATE SODIUM 8.6-50 MG PO TABS: 1.0000 | ORAL_TABLET | Freq: Every evening | ORAL | Status: DC | PRN

## 2017-08-14 MED ORDER — SODIUM CHLORIDE 0.9 % IV SOLN
1000.0000 mg | Freq: Once | INTRAVENOUS | Status: AC
  Administered 2017-08-14: 1000 mg via INTRAVENOUS
  Filled 2017-08-14: qty 20

## 2017-08-14 MED ORDER — ASPIRIN EC 325 MG PO TBEC
325.0000 mg | DELAYED_RELEASE_TABLET | Freq: Every day | ORAL | Status: DC
  Administered 2017-08-15: 08:00:00 325 mg via ORAL
  Filled 2017-08-14: qty 1

## 2017-08-14 MED ORDER — ENOXAPARIN SODIUM 40 MG/0.4ML ~~LOC~~ SOLN
40.0000 mg | SUBCUTANEOUS | Status: DC
  Administered 2017-08-14: 40 mg via SUBCUTANEOUS
  Filled 2017-08-14: qty 0.4

## 2017-08-14 MED ORDER — ACETAMINOPHEN 160 MG/5ML PO SOLN
650.0000 mg | ORAL | Status: DC | PRN
  Filled 2017-08-14: qty 20.3

## 2017-08-14 MED ORDER — PHENYTOIN SODIUM EXTENDED 100 MG PO CAPS
300.0000 mg | ORAL_CAPSULE | Freq: Every day | ORAL | Status: DC
  Administered 2017-08-14: 300 mg via ORAL
  Filled 2017-08-14 (×2): qty 3

## 2017-08-14 NOTE — ED Notes (Signed)
Pt assisted off the bed pain by this tech, pt expresses no complaints at this time

## 2017-08-14 NOTE — ED Triage Notes (Signed)
Pt via ems from home after suffering a seizure last night (hx of same, is on meds) and burning chest pain. Pt reports burning in left upper chest. Pt alert & oriented with NAD noted.

## 2017-08-14 NOTE — ED Provider Notes (Signed)
[facility] Emergency Department Provider Note ____________________________________________   None    (approximate)  I have reviewed the triage vital signs and the nursing notes.   HISTORY  Chief Complaint Chest Pain and Seizures    HPI [name] is a 52 y.o. female with PMH as noted below including history of seizure disorder as well as cocaine abuse who presents with a seizure last night around 11 PM, generalized, acute, and witnessed by her boyfriend.  The patient states that since that time she has had tingling and numbness on the left upper and lower extremities as well as some burning chest pain and pain to the left side of her torso.  The patient also reports some weakness on the left side.  She states that she does not normally have the symptoms after her seizures.  She also reported after the seizure yesterday she had difficulty getting her words out although this is resolved.  The patient states that she last used crack cocaine 2 nights ago.  She states she is compliant with all of her medications.  Past Medical History:  Diagnosis Date  . Allergy   . Anemia   . Arthritis   . Asthma    NO INHALERS  . Bipolar disorder (HCC)   . Cocaine abuse in remission (HCC)   . Depression   . Dyspnea   . Eczema   . Nervous   . Schizophrenia (HCC)   . Scoliosis   . Seizure (HCC)   . Seizures (HCC)    Last seizure, May 20, 2016-PT WAS ALONE-UNSURE LENGTH OF SEIZURE-SCRAPED NOSE AND LIP  . Stroke [facility]) 2007  . Tobacco use     Patient Active Problem List   Diagnosis Date Noted  . Somatic dysfunction of spine, lumbar 12/11/2016  . Somatic dysfunction of spine, thoracic 12/11/2016  . Low back pain 11/27/2016  . Spasm of muscle of lower back 11/27/2016  . Mass involving both sides of chest wall   . Cyst of skin of breast 03/18/2016  . Right wrist pain 03/15/2016  . Eczema 05/16/2015  . Cocaine abuse in remission (HCC) 08/22/2014  . Seizure  disorder (HCC) 08/21/2014    Past Surgical History:  Procedure Laterality Date  . APPENDECTOMY  2000  . BACK SURGERY  2012  . BREAST CYST EXCISION Bilateral 05/29/2016   Procedure: MASS EXCISION CHEST WALL;  Surgeon: Ricarda Frame, MD;  Location: [facility] ORS;  Service: General;  Laterality: Bilateral;  . DILATION AND CURETTAGE OF UTERUS      Prior to Admission medications   Medication Sig Start Date End Date Taking? Authorizing Provider  divalproex (DEPAKOTE) 500 MG DR tablet Take 1 tablet (500 mg total) by mouth 3 (three) times daily. Patient taking differently: Take 500-1,000 mg by mouth 2 (two) times daily.  05/02/17 08/14/17 Yes Merrily Brittle, MD  phenytoin (DILANTIN) 100 MG ER capsule Take 1 capsule (100 mg total) by mouth 3 (three) times daily. Patient taking differently: Take 300 mg by mouth at bedtime.  05/02/17 08/14/17 Yes Merrily Brittle, MD  traZODone (DESYREL) 150 MG tablet Take 75 mg by mouth daily. 07/25/17  Yes [provider]  zolpidem (AMBIEN) 10 MG tablet Take 1 tablet by mouth daily. 07/25/17  Yes [provider]  albuterol (PROVENTIL HFA;VENTOLIN HFA) 108 (90 Base) MCG/ACT inhaler Inhale 2 puffs into the lungs every 6 (six) hours as needed for wheezing or shortness of breath. Patient not taking: Reported on 08/14/2017 07/02/16   Doles-Johnson,  Teah, NP  cyclobenzaprine (FLEXERIL) 10 MG tablet Take 1 tablet (10 mg total) by mouth at bedtime. Patient not taking: Reported on 08/14/2017 11/07/16   Doles-Johnson, Teah, NP  ibuprofen (ADVIL,MOTRIN) 800 MG tablet TAKE ONE TABLET BY MOUTH EVERY 8 HOURS AS NEEDED Patient not taking: Reported on 08/14/2017 04/21/17   Virl Axehaplin, Don C, MD  nicotine (NICOTROL) 10 MG inhaler Inhale 16-20 Cartridges daily (20 min of continuous puffing total) into the lungs as needed for smoking cessation. Patient not taking: Reported on 08/14/2017 06/06/16   Michiel CowboyMcGowan, Shannon A, PA-C  silver sulfADIAZINE (SSD) 1 % cream APPLY TO AFFECTED AREA DAILY  04/21/17   Virl Axehaplin, Don C, MD  traMADol (ULTRAM) 50 MG tablet Take 1 tablet (50 mg total) every 6 (six) hours as needed by mouth for moderate pain. Patient not taking: Reported on 08/14/2017 12/18/16   Joni ReiningSmith, Ronald K, PA-C  triamcinolone cream (KENALOG) 0.1 % Apply 1 application topically 2 (two) times daily. 06/06/16   Michiel CowboyMcGowan, Shannon A, PA-C    Allergies Flagyl [metronidazole]  Family History  Problem Relation Age of Onset  . Epilepsy Mother   . Seizures Mother   . Heart disease Father   . Epilepsy Maternal Grandmother   . Heart attack Maternal Grandmother     Social History Social History   Tobacco Use  . Smoking status: Current Every Day Smoker    Packs/day: 0.75    Years: 47.00    Pack years: 35.25    Types: Cigarettes  . Smokeless tobacco: Never Used  . Tobacco comment: Interested in resources   Substance Use Topics  . Alcohol use: Yes    Alcohol/week: 29.4 oz    Types: 49 Cans of beer per week  . Drug use: No    Types: Cocaine    Review of Systems  Constitutional: No fever. Eyes: No redness. ENT: No neck pain. Cardiovascular: Positive for chest pain. Respiratory: Denies shortness of breath. Gastrointestinal: No vomiting.   Genitourinary: Negative for flank pain.  Musculoskeletal: Positive for muscle aches. Skin: Negative for rash. Neurological: Positive for seizure.  Positive for left-sided weakness and numbness.   ____________________________________________   PHYSICAL EXAM:  VITAL SIGNS: ED Triage Vitals  Enc Vitals Group     BP 08/14/17 1216 (!) 130/96     Pulse Rate 08/14/17 1216 90     Resp 08/14/17 1216 17     Temp 08/14/17 1216 98.7 F (37.1 C)     Temp Source 08/14/17 1216 Oral     SpO2 08/14/17 1216 94 %     Weight 08/14/17 1218 98 lb (44.5 kg)     Height 08/14/17 1218 5' (1.524 m)     Head Circumference --      Peak Flow --      Pain Score 08/14/17 1217 7     Pain Loc --      Pain Edu? --      Excl. in GC? --      Constitutional: Alert and oriented.  Relatively comfortable appearing and in no acute distress. Eyes: Conjunctivae are normal.  EOMI.  PERRLA.  No neglect.   Head: Atraumatic. Nose: No congestion/rhinnorhea. Mouth/Throat: Mucous membranes are moist.   Neck: Normal range of motion.  Cardiovascular: Normal rate, regular rhythm. Grossly normal heart sounds.  Good peripheral circulation. Respiratory: Normal respiratory effort.  No retractions. Lungs CTAB. Gastrointestinal: Soft and nontender. No distention.  Genitourinary: No flank tenderness. Musculoskeletal: No lower extremity edema.  Extremities warm and well perfused.  Neurologic:  Normal speech and language.  Left upper and lower extremity slight weakness when compared to the right although there is no drift.  Subjective decreased but not absent sensation to the left upper and lower extremity.  No ataxia. Skin:  Skin is warm and dry. No rash noted. Psychiatric: Mood and affect are normal. Speech and behavior are normal.  ____________________________________________   LABS (all labs ordered are listed, but only abnormal results are displayed)  Labs Reviewed  PHENYTOIN LEVEL, TOTAL - Abnormal; Notable for the following components:      Result Value   Phenytoin Lvl <2.5 (*)    All other components within normal limits  VALPROIC ACID LEVEL - Abnormal; Notable for the following components:   Valproic Acid Lvl 19 (*)    All other components within normal limits  BASIC METABOLIC PANEL  CBC  TROPONIN I   ____________________________________________  EKG  ED ECG REPORT I, Dionne Bucy, the attending physician, personally viewed and interpreted this ECG.  Date: 08/14/2017 EKG Time: 1216 Rate: 95 Rhythm: normal sinus rhythm QRS Axis: normal Intervals: normal ST/T Wave abnormalities: normal Narrative Interpretation: no evidence of acute ischemia  ____________________________________________  RADIOLOGY  CT head: No  ICH or other acute  intracranial findings CXR: No focal filtrate or other acute findings  ____________________________________________   PROCEDURES  Procedure(s) performed: No  Procedures  Critical Care performed: No ____________________________________________   INITIAL IMPRESSION / ASSESSMENT AND PLAN / ED COURSE  Pertinent labs & imaging results that were available during my care of the patient were reviewed by me and considered in my medical decision making (see chart for details).  52 year old female with a history of seizure disorder as well as history of cocaine abuse presents after an apparent seizure approximately 14 hours ago with subsequent left-sided weakness and decreased sensation since that time.  Patient also reports some aphasia which resolved last night.  In addition she reports burning left-sided chest pain.  I reviewed the past medical records in Epic; patient was seen in the ED on 05/01/2017 after a seizure.  She states this was her most recent seizure.  At that time there was no report of similar left-sided weakness.  Overall I suspect most likely seizure related either to medication noncompliance or active substance abuse.  The left-sided paresthesias and weakness are likely related to seizure and could represent Todd's paralysis, however given that it is been approximately 14 hours since the seizure, a stroke is also possible.  I will obtain CT head, lab work-up including medication levels, and reassess.  ----------------------------------------- 2:49 PM on 08/14/2017 -----------------------------------------  CT shows no significant acute findings.  Patient's Depakote and Dilantin levels are both low, so I have initiated Dilantin loading by IV and a dose of Depakote.  On reassessment, the patient continues to have persistent weakness on the left when compared to the right especially with grip strength.  Although I suspect that this is most likely Todd's  paralysis or other benign etiology, given that this is persisted for more than 12 hours after the seizure, patient will require further work-up to rule out stroke.  I signed the patient out to the hospitalist Dr. Elpidio Anis for admission. ____________________________________________   FINAL CLINICAL IMPRESSION(S) / ED DIAGNOSES  Final diagnoses:  Seizure disorder (HCC)  Left-sided weakness      NEW MEDICATIONS STARTED DURING THIS VISIT:  New Prescriptions   No medications on file     Note:  This document was prepared using Dragon voice recognition  software and may include unintentional dictation errors.    Dionne Bucy, MD 08/14/17 1451

## 2017-08-14 NOTE — ED Notes (Signed)
Pt assisted by this writer getting on bed pan, this tech outside of pt room for pt privacy, pt instructed to hit the call bell when she is finished.

## 2017-08-14 NOTE — ED Notes (Signed)
Pt given 325 asa and 1 spray nitro en route

## 2017-08-14 NOTE — Progress Notes (Signed)
OT Cancellation Note  Patient Details Name: [name] MRN: [id number] DOB: 02/03/1966   Cancelled Treatment:    Reason Eval/Treat Not Completed: Patient at procedure or test/ unavailable. Order received, chart reviewed. Pt not yet transferred to floor yet. Also noted to be receiving a carotid ultrasound at this time. Will re-attempt OT evaluation next date pending medical appropriateness and pt availability.   Richrd PrimeJamie Stiller, MPH, MS, OTR/L ascom [phone] 08/14/17, 4:28 PM

## 2017-08-14 NOTE — H&P (Signed)
[facility] [place] [facility]   PATIENT NAME: [name]    MR#:  [id number]  DATE OF BIRTH:  12-10-1965  DATE OF ADMISSION:  08/14/2017  PRIMARY CARE PHYSICIAN: System, Provider Not In   REQUESTING/REFERRING PHYSICIAN: Dr. Marisa Severin  CHIEF COMPLAINT:   Chief Complaint  Patient presents with  . Chest Pain  . Seizures    HISTORY OF PRESENT ILLNESS:  [name]  is a 52 y.o. female with a known history of seizures, polysubstance abuse, alcohol abuse presented to the emergency room today with complaints of left sided numbness and weakness after seizure at 11 PM.  Patient mentions that she has been compliant with medications but both her phenytoin and Depakote levels are low.  Her last drink of alcohol was earlier today.  She also had some word finding difficulty after her seizure which has resolved.  Seizure was witnessed by boyfriend at bedside.  Generalized tonic-clonic seizure.  She denies using any cocaine and tells me that she is in remission.  Afebrile.  No signs of infection.  CT scan of the head checked in the emergency room was normal.   PAST MEDICAL HISTORY:   Past Medical History:  Diagnosis Date  . Allergy   . Anemia   . Arthritis   . Asthma    NO INHALERS  . Bipolar disorder (HCC)   . Cocaine abuse in remission (HCC)   . Depression   . Dyspnea   . Eczema   . Nervous   . Schizophrenia (HCC)   . Scoliosis   . Seizure (HCC)   . Seizures (HCC)    Last seizure, May 20, 2016-PT WAS ALONE-UNSURE LENGTH OF SEIZURE-SCRAPED NOSE AND LIP  . Stroke [facility]) 2007  . Tobacco use     PAST SURGICAL HISTORY:   Past Surgical History:  Procedure Laterality Date  . APPENDECTOMY  2000  . BACK SURGERY  2012  . BREAST CYST EXCISION Bilateral 05/29/2016   Procedure: MASS EXCISION CHEST WALL;  Surgeon: Ricarda Frame, MD;  Location: [facility] ORS;  Service: General;  Laterality: Bilateral;  . DILATION AND CURETTAGE OF UTERUS      SOCIAL HISTORY:    Social History   Tobacco Use  . Smoking status: Current Every Day Smoker    Packs/day: 0.75    Years: 47.00    Pack years: 35.25    Types: Cigarettes  . Smokeless tobacco: Never Used  . Tobacco comment: Interested in resources   Substance Use Topics  . Alcohol use: Yes    Alcohol/week: 29.4 oz    Types: 49 Cans of beer per week    FAMILY HISTORY:   Family History  Problem Relation Age of Onset  . Epilepsy Mother   . Seizures Mother   . Heart disease Father   . Epilepsy Maternal Grandmother   . Heart attack Maternal Grandmother     DRUG ALLERGIES:   Allergies  Allergen Reactions  . Flagyl [Metronidazole] Other (See Comments)    "my eyes go back in the back of my head"    REVIEW OF SYSTEMS:   Review of Systems  Constitutional: Positive for malaise/fatigue. Negative for chills and fever.  HENT: Negative for sore throat.   Eyes: Negative for blurred vision, double vision and pain.  Respiratory: Negative for cough, hemoptysis, shortness of breath and wheezing.   Cardiovascular: Negative for chest pain, palpitations, orthopnea and leg swelling.  Gastrointestinal: Negative for abdominal pain, constipation, diarrhea, heartburn, nausea and vomiting.  Genitourinary:  Negative for dysuria and hematuria.  Musculoskeletal: Negative for back pain and joint pain.  Skin: Negative for rash.  Neurological: Positive for sensory change, speech change, focal weakness, seizures and loss of consciousness. Negative for headaches.  Endo/Heme/Allergies: Does not bruise/bleed easily.  Psychiatric/Behavioral: Negative for depression. The patient is not nervous/anxious.     MEDICATIONS AT HOME:   Prior to Admission medications   Medication Sig Start Date End Date Taking? Authorizing Provider  divalproex (DEPAKOTE) 500 MG DR tablet Take 1 tablet (500 mg total) by mouth 3 (three) times daily. Patient taking differently: Take 500-1,000 mg by mouth 2 (two) times daily.  05/02/17 08/14/17 Yes  Merrily Brittleifenbark, Neil, MD  phenytoin (DILANTIN) 100 MG ER capsule Take 1 capsule (100 mg total) by mouth 3 (three) times daily. Patient taking differently: Take 300 mg by mouth at bedtime.  05/02/17 08/14/17 Yes Merrily Brittleifenbark, Neil, MD  traZODone (DESYREL) 150 MG tablet Take 75 mg by mouth daily. 07/25/17  Yes [provider]  zolpidem (AMBIEN) 10 MG tablet Take 1 tablet by mouth daily. 07/25/17  Yes [provider]  albuterol (PROVENTIL HFA;VENTOLIN HFA) 108 (90 Base) MCG/ACT inhaler Inhale 2 puffs into the lungs every 6 (six) hours as needed for wheezing or shortness of breath. Patient not taking: Reported on 08/14/2017 07/02/16   Doles-Johnson, Teah, NP  cyclobenzaprine (FLEXERIL) 10 MG tablet Take 1 tablet (10 mg total) by mouth at bedtime. Patient not taking: Reported on 08/14/2017 11/07/16   Doles-Johnson, Teah, NP  ibuprofen (ADVIL,MOTRIN) 800 MG tablet TAKE ONE TABLET BY MOUTH EVERY 8 HOURS AS NEEDED Patient not taking: Reported on 08/14/2017 04/21/17   Virl Axehaplin, Don C, MD  nicotine (NICOTROL) 10 MG inhaler Inhale 16-20 Cartridges daily (20 min of continuous puffing total) into the lungs as needed for smoking cessation. Patient not taking: Reported on 08/14/2017 06/06/16   Michiel CowboyMcGowan, Shannon A, PA-C  silver sulfADIAZINE (SSD) 1 % cream APPLY TO AFFECTED AREA DAILY 04/21/17   Virl Axehaplin, Don C, MD  traMADol (ULTRAM) 50 MG tablet Take 1 tablet (50 mg total) every 6 (six) hours as needed by mouth for moderate pain. Patient not taking: Reported on 08/14/2017 12/18/16   Joni ReiningSmith, Ronald K, PA-C  triamcinolone cream (KENALOG) 0.1 % Apply 1 application topically 2 (two) times daily. 06/06/16   McGowan, Carollee HerterShannon A, PA-C     VITAL SIGNS:  Blood pressure (!) 113/91, pulse 79, temperature 98.7 F (37.1 C), resp. rate 17, height 5' (1.524 m), weight 44.5 kg (98 lb), SpO2 96 %.  PHYSICAL EXAMINATION:  Physical Exam  GENERAL:  52 y.o.-year-old patient lying in the bed with no acute distress.  EYES: Pupils equal,  round, reactive to light and accommodation. No scleral icterus. Extraocular muscles intact.  HEENT: Head atraumatic, normocephalic. Oropharynx and nasopharynx clear. No oropharyngeal erythema, moist oral mucosa  NECK:  Supple, no jugular venous distention. No thyroid enlargement, no tenderness.  LUNGS: Normal breath sounds bilaterally, no wheezing, rales, rhonchi. No use of accessory muscles of respiration.  CARDIOVASCULAR: S1, S2 normal. No murmurs, rubs, or gallops.  ABDOMEN: Soft, nontender, nondistended. Bowel sounds present. No organomegaly or mass.  EXTREMITIES: No pedal edema, cyanosis, or clubbing. + 2 pedal & radial pulses b/l.   NEUROLOGIC: Cranial nerves II through XII are intact.  Decreased sensations on the left.  Motor strength 4/5 on the left side and 5/5 on the right PSYCHIATRIC: The patient is alert and oriented x 3. Good affect.  SKIN: No obvious rash, lesion, or ulcer.  LABORATORY PANEL:   CBC Recent Labs  Lab 08/14/17 1220  WBC 5.7  HGB 13.5  HCT 39.2  PLT 157   ------------------------------------------------------------------------------------------------------------------  Chemistries  Recent Labs  Lab 08/14/17 1220  NA 140  K 4.1  CL 105  CO2 23  GLUCOSE 88  BUN 8  CREATININE 0.47  CALCIUM 9.2   ------------------------------------------------------------------------------------------------------------------  Cardiac Enzymes Recent Labs  Lab 08/14/17 1220  TROPONINI <0.03   ------------------------------------------------------------------------------------------------------------------  RADIOLOGY:  Dg Chest 2 View  Result Date: 08/14/2017 CLINICAL DATA:  Seizure.  Chest pain. EXAM: CHEST - 2 VIEW COMPARISON:  04/02/2015 FINDINGS: The lungs are clear without focal pneumonia, edema, pneumothorax or pleural effusion. The cardiopericardial silhouette is within normal limits for size. Marked thoracolumbar scoliosis again noted. Telemetry leads  overlie the chest. IMPRESSION: No active cardiopulmonary disease. Electronically Signed   By: Kennith Center M.D.   On: 08/14/2017 12:46   Ct Head Wo Contrast  Result Date: 08/14/2017 CLINICAL DATA:  Seizure EXAM: CT HEAD WITHOUT CONTRAST TECHNIQUE: Contiguous axial images were obtained from the base of the skull through the vertex without intravenous contrast. COMPARISON:  August 22, 2015 FINDINGS: Brain: The ventricles are normal in size and configuration. There is no intracranial mass, hemorrhage, extra-axial fluid collection, or midline shift. Gray-white compartments appear normal. No acute infarct evident. Vascular: No hyperdense vessel.  No vascular calcification evident. Skull: Bony calvarium appears intact. Sinuses/Orbits: There is mild mucosal thickening in several ethmoid air cells. Other visualized paranasal sinuses are clear. Visualized orbits appear symmetric bilaterally. Other: Visualized mastoid air cells are clear. IMPRESSION: Mild mucosal thickening in several ethmoid air cells. Study otherwise unremarkable. Electronically Signed   By: Bretta Bang III M.D.   On: 08/14/2017 13:31     IMPRESSION AND PLAN:   *Left upper and lower extremity weakness with numbness. Patient has varying symptoms but will need to rule out CVA due to prolonged symptoms.  Could be Todd's paralysis after her seizure episode. Will admit with stroke protocol.  MRI and MRA of the brain.  Echocardiogram and carotid Dopplers.  Aspirin, statin.  Check hemoglobin A1c and lipid profile.  Neurochecks.  Telemetry monitoring.  *Seizures.  Patient has low phenytoin and Depakote level.  Has history of noncompliance.  Patient has been given IV loading dose of phenytoin and extra dose of Depakote.  Continue home doses at this time.  *Polysubstance abuse.  Check urine drug screen.  Patient's last dose of alcohol was earlier today.  Will need to start alcohol withdrawal protocol if patient has prolonged stay in the  hospital.  *DVT prophylaxis with Lovenox   All the records are reviewed and case discussed with ED provider. Management plans discussed with the patient, family and they are in agreement.  CODE STATUS: FULL CODE  TOTAL TIME TAKING CARE OF THIS PATIENT: 40 minutes.   Orie Fisherman M.D on 08/14/2017 at 3:29 PM  Between 7am to 6pm - Pager - [phone]  After 6pm go to [email or web address] - password [email or web address]  [facility]  Office  [phone]  CC: Primary care physician; System, Provider Not In  Note: This dictation was prepared with Dragon dictation along with smaller phrase technology. Any transcriptional errors that result from this process are unintentional.

## 2017-08-15 ENCOUNTER — Observation Stay (HOSPITAL_BASED_OUTPATIENT_CLINIC_OR_DEPARTMENT_OTHER)
Admit: 2017-08-15 | Discharge: 2017-08-15 | Disposition: A | Discharge status: Home / Self Care | Payer: Medicare HMO | Attending: Internal Medicine | Admitting: Internal Medicine

## 2017-08-15 DIAGNOSIS — I639 Cerebral infarction, unspecified: Secondary | ICD-10-CM

## 2017-08-15 DIAGNOSIS — G40909 Epilepsy, unspecified, not intractable, without status epilepticus: Secondary | ICD-10-CM

## 2017-08-15 DIAGNOSIS — I517 Cardiomegaly: Secondary | ICD-10-CM

## 2017-08-15 DIAGNOSIS — G8384 Todd's paralysis (postepileptic): Secondary | ICD-10-CM | POA: Diagnosis not present

## 2017-08-15 LAB — ECHOCARDIOGRAM COMPLETE
HEIGHTINCHES: 60 in
Weight: 1568 oz

## 2017-08-15 LAB — HEMOGLOBIN A1C
HEMOGLOBIN A1C: 5.2 % (ref 4.8–5.6)
MEAN PLASMA GLUCOSE: 102.54 mg/dL

## 2017-08-15 LAB — LIPID PANEL
Cholesterol: 166 mg/dL (ref 0–200)
HDL: 101 mg/dL (ref >40)
LDL Cholesterol: 57 mg/dL (ref 0–99)
Total CHOL/HDL Ratio: 1.6 RATIO
Triglycerides: 42 mg/dL (ref <150)
VLDL: 8 mg/dL (ref 0–40)

## 2017-08-15 MED ORDER — ZOLPIDEM TARTRATE 5 MG PO TABS: 5.0000 mg | ORAL_TABLET | Freq: Every day | ORAL | 0 refills | Status: DC

## 2017-08-15 MED ORDER — PHENYTOIN SODIUM EXTENDED 100 MG PO CAPS: 300.0000 mg | ORAL_CAPSULE | Freq: Every day | ORAL | 0 refills | Status: DC

## 2017-08-15 MED ORDER — ASPIRIN EC 81 MG PO TBEC: 81.0000 mg | DELAYED_RELEASE_TABLET | Freq: Every day | ORAL | 2 refills | Status: AC

## 2017-08-15 MED ORDER — DIVALPROEX SODIUM 500 MG PO DR TAB: 500.0000 mg | DELAYED_RELEASE_TABLET | Freq: Two times a day (BID) | ORAL | 0 refills | Status: DC

## 2017-08-15 MED ORDER — ATORVASTATIN CALCIUM 40 MG PO TABS: 40.0000 mg | ORAL_TABLET | Freq: Every day | ORAL | 2 refills | Status: DC

## 2017-08-15 MED ORDER — ENOXAPARIN SODIUM 30 MG/0.3ML ~~LOC~~ SOLN: 30.0000 mg | SUBCUTANEOUS | Status: DC

## 2017-08-15 MED ORDER — ONDANSETRON HCL 4 MG/2ML IJ SOLN
4.0000 mg | Freq: Four times a day (QID) | INTRAMUSCULAR | Status: DC | PRN
  Administered 2017-08-15: 4 mg via INTRAVENOUS
  Filled 2017-08-15: qty 2

## 2017-08-15 NOTE — Progress Notes (Signed)
SLP Cancellation Note  Patient Details Name: [name] MRN: [id number] DOB: Nov 21, 1965   Cancelled treatment:       Reason Eval/Treat Not Completed: SLP screened, no needs identified, will sign off(chart reviewed; consulted NSG(x2) then met w/ pt). Pt denied any difficulty swallowing and is currently on a regular diet; tolerates swallowing pills w/ water per NSG. Pt was eating a bag of Fritos w/ no complaints. Pt is missing most dentition but mashes foods w/ gums. (Instructed the dining services to cut tougher meats and add gravy.) Pt conversed at conversational level w/out deficits noted; pt denied any speech-language deficits. She was talking on the phone appropriately w/ a friend, and she contacted the kitchen to give her diet order.   No further skilled ST services indicated as pt appears at her baseline. Pt agreed. NSG to reconsult if any change in status.    Orinda Kenner, MS, CCC-SLP Watson,Katherine 08/15/2017, 8:01 AM

## 2017-08-15 NOTE — Plan of Care (Signed)
Pt called back after d/c and asked if dr would send scripts over to [facility] b/c they deliver her medications.  I called Dr. Nemiah CommanderKalisetti - Pt had already been removed from system.  She sd that 1 medication script was just over the counter aspirin and the other was a generic statin that [facility].  I called pt back and LM on voicemail to this effect.

## 2017-08-15 NOTE — Care Management Obs Status (Signed)
MEDICARE OBSERVATION STATUS NOTIFICATION   Patient Details  Name: Shelly DoppDoretha A Tenpenny MRN: [id number] Date of Birth: 05/19/1965   Medicare Observation Status Notification Given:  Yes    Gwenette GreetBrenda S Keighan Amezcua, RN 08/15/2017, 8:40 AM

## 2017-08-15 NOTE — Discharge Summary (Signed)
[facility] [place] [facility]   PATIENT NAME: [name]    MR#:  [id number]  DATE OF BIRTH:  07-25-1965  DATE OF ADMISSION:  08/14/2017   ADMITTING PHYSICIAN: Milagros Loll, MD  DATE OF DISCHARGE:  08/15/17  PRIMARY CARE PHYSICIAN: System, Provider Not In   ADMISSION DIAGNOSIS:   CVA (cerebral vascular accident) (HCC) [I63.9] Seizure disorder (HCC) [G40.909] Left-sided weakness [R53.1]  DISCHARGE DIAGNOSIS:   Active Problems:   Seizure (HCC)   SECONDARY DIAGNOSIS:   Past Medical History:  Diagnosis Date  . Allergy   . Anemia   . Arthritis   . Asthma    NO INHALERS  . Bipolar disorder (HCC)   . Cocaine abuse in remission (HCC)   . Depression   . Dyspnea   . Eczema   . Nervous   . Schizophrenia (HCC)   . Scoliosis   . Seizure (HCC)   . Seizures (HCC)    Last seizure, May 20, 2016-PT WAS ALONE-UNSURE LENGTH OF SEIZURE-SCRAPED NOSE AND LIP  . Stroke [facility]) 2007  . Tobacco use     HOSPITAL COURSE:   52 year old female with past medical history significant for bipolar disorder, schizophrenia, seizure disorder, polysubstance abuse, prior history of stroke without any residual neurological deficits presents to hospital secondary to seizures and left sided weakness.  1. 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 Seizure disorder-follows with outpatient neurologist. Last visit was last month. -No change in her medications recently. Takes Depakote 500 mg in the morning and 1000 milligrams at bedtime and phenytoin 300 mg at bedtime -states she has been compliant. Levels are low however -appreciate neurology consult. -Will need to cut down on alcohol and cocaine which can be lowering her seizure threshold.  3. Schizoaffective  disorder-following with behavioral health community clinic. Does have hallucinations at baseline. -Trazodone at bedtime for insomnia and continue outpatient follow up  4. Polysubstance abuse-urine talk screen positive for cocaine. Also known history of alcohol use. Strongly counseled against it    Updated boyfriend at bedside. discharge today. Home health at discharge    DISCHARGE CONDITIONS:   Guarded  CONSULTS OBTAINED:   Treatment Team:  Thana Farr, MD  DRUG ALLERGIES:   Allergies  Allergen Reactions  . Flagyl [Metronidazole] Other (See Comments)    "my eyes go back in the back of my head"   DISCHARGE MEDICATIONS:   Allergies as of 08/15/2017      Reactions   Flagyl [metronidazole] Other (See Comments)   "my eyes go back in the back of my head"      Medication List    STOP taking these medications   albuterol 108 (90 Base) MCG/ACT inhaler Commonly known as:  PROVENTIL HFA;VENTOLIN HFA   cyclobenzaprine 10 MG tablet Commonly known as:  FLEXERIL   ibuprofen 800 MG tablet Commonly known as:  ADVIL,MOTRIN   nicotine 10 MG inhaler Commonly known as:  NICOTROL   silver sulfADIAZINE 1 % cream Commonly known as:  SSD   traMADol 50 MG tablet Commonly known as:  ULTRAM   triamcinolone cream 0.1 % Commonly known as:  KENALOG     TAKE these medications   aspirin EC 81 MG tablet Take 1 tablet (81 mg total) by mouth daily.   atorvastatin 40 MG tablet Commonly known  as:  LIPITOR Take 1 tablet (40 mg total) by mouth daily at 6 PM.   divalproex 500 MG DR tablet Commonly known as:  DEPAKOTE Take 1-2 tablets (500-1,000 mg total) by mouth 2 (two) times daily.   phenytoin 100 MG ER capsule Commonly known as:  DILANTIN Take 3 capsules (300 mg total) by mouth at bedtime.   traZODone 150 MG tablet Commonly known as:  DESYREL Take 75 mg by mouth daily.   zolpidem 5 MG tablet Commonly known as:  AMBIEN Take 1 tablet (5 mg total) by mouth  daily. What changed:    medication strength  how much to take            Durable Medical Equipment  (From admission, onward)        Start     Ordered   08/15/17 0910  For home use only DME Walker  Once    Question:  Patient needs a walker to treat with the following condition  Answer:  Seizure (HCC)   08/15/17 0910       DISCHARGE INSTRUCTIONS:   1. PCP f/u in 1 week 2. Neurology f/u in 1 week  DIET:   Regular diet  ACTIVITY:   Activity as tolerated No driving for 6 months since last seizure  OXYGEN:   Home Oxygen: No.  Oxygen Delivery: room air  DISCHARGE LOCATION:   home   If you experience worsening of your admission symptoms, develop shortness of breath, life threatening emergency, suicidal or homicidal thoughts you must seek medical attention immediately by calling 911 or calling your MD immediately  if symptoms less severe.  You Must read complete instructions/literature along with all the possible adverse reactions/side effects for all the Medicines you take and that have been prescribed to you. Take any new Medicines after you have completely understood and accpet all the possible adverse reactions/side effects.   Please note  You were cared for by a hospitalist during your hospital stay. If you have any questions about your discharge medications or the care you received while you were in the hospital after you are discharged, you can call the unit and asked to speak with the hospitalist on call if the hospitalist that took care of you is not available. Once you are discharged, your primary care physician will handle any further medical issues. Please note that NO REFILLS for any discharge medications will be authorized once you are discharged, as it is imperative that you return to your primary care physician (or establish a relationship with a primary care physician if you do not have one) for your aftercare needs so that they can reassess your need for  medications and monitor your lab values.    On the day of Discharge:  VITAL SIGNS:   Blood pressure 100/68, pulse 86, temperature 98.7 F (37.1 C), temperature source Oral, resp. rate 18, height 5' (1.524 m), weight 44.5 kg (98 lb), SpO2 96 %.  PHYSICAL EXAMINATION:    GENERAL:  52 y.o.-year-old patient sitting in the bed with no acute distress.  EYES: Pupils equal, round, reactive to light and accommodation. No scleral icterus. Extraocular muscles intact.  HEENT: Head atraumatic, normocephalic. Oropharynx and nasopharynx clear.  NECK:  Supple, no jugular venous distention. No thyroid enlargement, no tenderness.  LUNGS: Normal breath sounds bilaterally, no wheezing, rales,rhonchi or crepitation. No use of accessory muscles of respiration. Decreased bibasilar breath sounds CARDIOVASCULAR: S1, S2 normal. No murmurs, rubs, or gallops.  ABDOMEN: Soft, nontender, nondistended. Bowel  sounds present. No organomegaly or mass.  EXTREMITIES: No pedal edema, cyanosis, or clubbing.  NEUROLOGIC: Cranial nerves II through XII are intact. Muscle strength 5/5 in all extremities. Sensation intact. Gait steady when using a walker.  PSYCHIATRIC: The patient is alert and oriented x 3.  SKIN: No obvious rash, lesion, or ulcer.     DATA REVIEW:   CBC Recent Labs  Lab 08/14/17 1220  WBC 5.7  HGB 13.5  HCT 39.2  PLT 157    Chemistries  Recent Labs  Lab 08/14/17 1220  NA 140  K 4.1  CL 105  CO2 23  GLUCOSE 88  BUN 8  CREATININE 0.47  CALCIUM 9.2     Microbiology Results  No results found for this or any previous visit.  RADIOLOGY:  Mr Brain Wo Contrast  Result Date: 08/15/2017 CLINICAL DATA:  Witnessed seizure last night. LEFT extremity numbness and tingling. History of cocaine abuse, seizure disorder. EXAM: MRI HEAD WITHOUT CONTRAST MRA HEAD WITHOUT CONTRAST TECHNIQUE: Multiplanar, multiecho pulse sequences of the brain and surrounding structures were obtained without intravenous  contrast. Angiographic images of the head were obtained using MRA technique without contrast. COMPARISON:  CT HEAD August 14, 2017 an MRI head February 01, 2013 FINDINGS: MRI HEAD FINDINGS-mild motion degraded examination. INTRACRANIAL CONTENTS: No reduced diffusion to suggest acute ischemia. No susceptibility artifact to suggest hemorrhage. The ventricles and sulci are normal for patient's age. Scattered subcentimeter supratentorial white matter FLAIR T2 hyperintensities are unchanged. 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 No suspicious parenchymal signal, masses, mass effect. No abnormal extra-axial fluid collections. No extra-axial masses. VASCULAR: Normal major intracranial vascular flow voids present at skull base. SKULL AND UPPER CERVICAL SPINE: No abnormal sellar expansion. No suspicious calvarial bone marrow signal. Craniocervical junction maintained. SINUSES/ORBITS: The mastoid air-cells and included paranasal sinuses are well-aerated. Hypoplastic frontal sinuses. The included ocular globes and orbital contents are non-suspicious. OTHER: None. MRA HEAD FINDINGS-moderately motion degraded examination. ANTERIOR CIRCULATION: Normal flow related enhancement of the included cervical, petrous, cavernous and supraclinoid internal carotid arteries. Patent anterior communicating artery. Patent anterior and middle cerebral arteries, signal loss at bifurcation most compatible with motion artifact. Limited assessment for aneurysm or vasculopathy due to patient motion. POSTERIOR CIRCULATION: LEFT vertebral artery is dominant. Vertebrobasilar arteries are patent, with normal flow related enhancement of the main branch vessels. Patent posterior cerebral arteries. Bilateral posterior communicating arteries present. ANATOMIC VARIANTS: None. Source images and MIP images were reviewed. IMPRESSION: MRI HEAD: 1. No acute intracranial process on this mildly motion degraded examination. 2. Stable mild white matter changes most  compatible chronic small vessel ischemic disease. 3. 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 MRA HEAD: 1. Moderately motion degraded examination. 2. No emergent large vessel occlusion or convincing flow-limiting stenosis. Electronically Signed   By: Awilda Metro M.D.   On: 08/15/2017 00:28   US Carotid Bilateral (at [facility] Only)  Result Date: 08/14/2017 CLINICAL DATA:  CVA, syncope, coronary artery disease and tobacco use. EXAM: BILATERAL CAROTID DUPLEX ULTRASOUND TECHNIQUE: Wallace Cullens scale imaging, color Doppler and duplex ultrasound were performed of bilateral carotid and vertebral arteries in the neck. COMPARISON:  None. FINDINGS: Criteria: Quantification of carotid stenosis is based on velocity parameters that correlate the residual internal carotid diameter with NASCET-based stenosis levels, using the diameter of the distal internal carotid lumen as the denominator for stenosis measurement. The following velocity measurements were obtained: RIGHT ICA:  94/27 cm/sec CCA:  144/28 cm/sec SYSTOLIC ICA/CCA RATIO:  0.7 ECA:  119 cm/sec  LEFT ICA:  111/47 cm/sec CCA:  152/31 cm/sec SYSTOLIC ICA/CCA RATIO:  0.7 ECA:  129 cm/sec RIGHT CAROTID ARTERY: Mild amount of plaque at the level of the carotid bulb. There is a mild amount of noncalcified plaque at the ICA origin. Estimated right ICA stenosis is less than 50%. RIGHT VERTEBRAL ARTERY: Antegrade flow with normal waveform and velocity. LEFT CAROTID ARTERY: Minimal plaque in the carotid bulb and proximal ICA. Estimated left ICA stenosis is less than 50%. LEFT VERTEBRAL ARTERY: Antegrade flow with normal waveform and velocity. IMPRESSION: Mild plaque at the level of both carotid bulbs and proximal internal carotid arteries. No significant stenosis identified with estimated bilateral ICA stenoses of less than 50%. Electronically Signed   By: Irish Lack M.D.   On: 08/14/2017 16:44   Mr Maxine Glenn Head/brain ZO Cm  Result Date: 08/15/2017 CLINICAL DATA:  Witnessed  seizure last night. LEFT extremity numbness and tingling. History of cocaine abuse, seizure disorder. EXAM: MRI HEAD WITHOUT CONTRAST MRA HEAD WITHOUT CONTRAST TECHNIQUE: Multiplanar, multiecho pulse sequences of the brain and surrounding structures were obtained without intravenous contrast. Angiographic images of the head were obtained using MRA technique without contrast. COMPARISON:  CT HEAD August 14, 2017 an MRI head February 01, 2013 FINDINGS: MRI HEAD FINDINGS-mild motion degraded examination. INTRACRANIAL CONTENTS: No reduced diffusion to suggest acute ischemia. No susceptibility artifact to suggest hemorrhage. The ventricles and sulci are normal for patient's age. Scattered subcentimeter supratentorial white matter FLAIR T2 hyperintensities are unchanged. 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 No suspicious parenchymal signal, masses, mass effect. No abnormal extra-axial fluid collections. No extra-axial masses. VASCULAR: Normal major intracranial vascular flow voids present at skull base. SKULL AND UPPER CERVICAL SPINE: No abnormal sellar expansion. No suspicious calvarial bone marrow signal. Craniocervical junction maintained. SINUSES/ORBITS: The mastoid air-cells and included paranasal sinuses are well-aerated. Hypoplastic frontal sinuses. The included ocular globes and orbital contents are non-suspicious. OTHER: None. MRA HEAD FINDINGS-moderately motion degraded examination. ANTERIOR CIRCULATION: Normal flow related enhancement of the included cervical, petrous, cavernous and supraclinoid internal carotid arteries. Patent anterior communicating artery. Patent anterior and middle cerebral arteries, signal loss at bifurcation most compatible with motion artifact. Limited assessment for aneurysm or vasculopathy due to patient motion. POSTERIOR CIRCULATION: LEFT vertebral artery is dominant. Vertebrobasilar arteries are patent, with normal flow related enhancement of the main branch vessels. Patent posterior  cerebral arteries. Bilateral posterior communicating arteries present. ANATOMIC VARIANTS: None. Source images and MIP images were reviewed. IMPRESSION: MRI HEAD: 1. No acute intracranial process on this mildly motion degraded examination. 2. Stable mild white matter changes most compatible chronic small vessel ischemic disease. 3. 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 MRA HEAD: 1. Moderately motion degraded examination. 2. No emergent large vessel occlusion or convincing flow-limiting stenosis. Electronically Signed   By: Awilda Metro M.D.   On: 08/15/2017 00:28     Management plans discussed with the patient, family and they are in agreement.  CODE STATUS:     Code Status Orders  (From admission, onward)        Start     Ordered   08/14/17 1455  Full code  Continuous     08/14/17 1457    Code Status History    Date Active Date Inactive Code Status Order ID Comments User Context   08/21/2014 0756 08/23/2014 1551 Full Code [id number]  Arnaldo Natal, MD Inpatient      TOTAL TIME TAKING CARE OF THIS PATIENT: 38 minutes.    Enid Baas M.D on  08/15/2017 at 2:21 PM  Between 7am to 6pm - Pager - [phone]  After 6pm go to [email or web address] - Social research officer, governmentpassword [email or web address]  [facility] [place] [facility]  Office  [phone]  CC: Primary care physician; System, Provider Not In   Note: This dictation was prepared with Dragon dictation along with smaller phrase technology. Any transcriptional errors that result from this process are unintentional.

## 2017-08-15 NOTE — Consult Note (Signed)
Referring Physician: Nemiah CommanderKalisetti    Chief Complaint: Left sided numbness, seizure  HPI: [name] is an 52 y.o. female with a known history of seizures, polysubstance abuse, alcohol abuse presented to the emergency room today with complaints of left sided numbness and weakness after seizure at 11 PM on 7/10.  Patient mentions that she has been compliant with medications but both her phenytoin and Depakote levels are low.  Her last drink of alcohol was on 7/11.  Her last cocaine use was about 3 days prior to presentation.  Seizure was witnessed by boyfriend. Initial NIHSS of 2.    Date last known well: Date: 08/13/2017 Time last known well: Time: 23:00 tPA Given: No: Outside time window  Past Medical History:  Diagnosis Date  . Allergy   . Anemia   . Arthritis   . Asthma    NO INHALERS  . Bipolar disorder (HCC)   . Cocaine abuse in remission (HCC)   . Depression   . Dyspnea   . Eczema   . Nervous   . Schizophrenia (HCC)   . Scoliosis   . Seizure (HCC)   . Seizures (HCC)    Last seizure, May 20, 2016-PT WAS ALONE-UNSURE LENGTH OF SEIZURE-SCRAPED NOSE AND LIP  . Stroke [facility](HCC) 2007  . Tobacco use     Past Surgical History:  Procedure Laterality Date  . APPENDECTOMY  2000  . BACK SURGERY  2012  . BREAST CYST EXCISION Bilateral 05/29/2016   Procedure: MASS EXCISION CHEST WALL;  Surgeon: Ricarda Frameharles Woodham, MD;  Location: [facility] ORS;  Service: General;  Laterality: Bilateral;  . DILATION AND CURETTAGE OF UTERUS      Family History  Problem Relation Age of Onset  . Epilepsy Mother   . Seizures Mother   . Heart disease Father   . Epilepsy Maternal Grandmother   . Heart attack Maternal Grandmother    Social History:  reports that she has been smoking cigarettes.  She has a 35.25 pack-year smoking history. She has never used smokeless tobacco. She reports that she drinks about 29.4 oz of alcohol per week. She reports that she does not use drugs.  Allergies:  Allergies   Allergen Reactions  . Flagyl [Metronidazole] Other (See Comments)    "my eyes go back in the back of my head"    Medications:  I have reviewed the patient's current medications. Prior to Admission:  Medications Prior to Admission  Medication Sig Dispense Refill Last Dose  . traZODone (DESYREL) 150 MG tablet Take 75 mg by mouth daily.   08/13/2017 at Unknown time  . [DISCONTINUED] divalproex (DEPAKOTE) 500 MG DR tablet Take 1 tablet (500 mg total) by mouth 3 (three) times daily. (Patient taking differently: Take 500-1,000 mg by mouth 2 (two) times daily. ) 90 tablet 0 08/14/2017 at Unknown time  . [DISCONTINUED] phenytoin (DILANTIN) 100 MG ER capsule Take 1 capsule (100 mg total) by mouth 3 (three) times daily. (Patient taking differently: Take 300 mg by mouth at bedtime. ) 90 capsule 0 08/13/2017 at Unknown time  . [DISCONTINUED] zolpidem (AMBIEN) 10 MG tablet Take 1 tablet by mouth daily.   08/13/2017 at Unknown time  . albuterol (PROVENTIL HFA;VENTOLIN HFA) 108 (90 Base) MCG/ACT inhaler Inhale 2 puffs into the lungs every 6 (six) hours as needed for wheezing or shortness of breath. (Patient not taking: Reported on 08/14/2017) 1 Inhaler 2 Not Taking at Unknown time  . cyclobenzaprine (FLEXERIL) 10 MG tablet Take 1 tablet (10 mg total)  by mouth at bedtime. (Patient not taking: Reported on 08/14/2017) 30 tablet 0 Not Taking at Unknown time  . ibuprofen (ADVIL,MOTRIN) 800 MG tablet TAKE ONE TABLET BY MOUTH EVERY 8 HOURS AS NEEDED (Patient not taking: Reported on 08/14/2017) 45 tablet 0 Not Taking at Unknown time  . nicotine (NICOTROL) 10 MG inhaler Inhale 16-20 Cartridges daily (20 min of continuous puffing total) into the lungs as needed for smoking cessation. (Patient not taking: Reported on 08/14/2017) 168 each 2 Not Taking at Unknown time  . silver sulfADIAZINE (SSD) 1 % cream APPLY TO AFFECTED AREA DAILY 25 g 0 PRN at PRN  . traMADol (ULTRAM) 50 MG tablet Take 1 tablet (50 mg total) every 6 (six)  hours as needed by mouth for moderate pain. (Patient not taking: Reported on 08/14/2017) 12 tablet 0 Not Taking at Unknown time  . triamcinolone cream (KENALOG) 0.1 % Apply 1 application topically 2 (two) times daily. 30 g 1 PRN at PRN   Scheduled: . aspirin  300 mg Rectal Daily   Or  . aspirin EC  325 mg Oral Daily  . atorvastatin  40 mg Oral q1800  . divalproex  500 mg Oral q morning - 10a   And  . divalproex  1,000 mg Oral QHS  . enoxaparin (LOVENOX) injection  30 mg Subcutaneous Q24H  . phenytoin  300 mg Oral QHS  . traZODone  75 mg Oral Daily    ROS: History obtained from the patient  General ROS: negative for - chills, fatigue, fever, night sweats, weight gain or weight loss Psychological ROS: negative for - behavioral disorder, hallucinations, memory difficulties, mood swings or suicidal ideation Ophthalmic ROS: negative for - blurry vision, double vision, eye pain or loss of vision ENT ROS: negative for - epistaxis, nasal discharge, oral lesions, sore throat, tinnitus or vertigo Allergy and Immunology ROS: negative for - hives or itchy/watery eyes Hematological and Lymphatic ROS: negative for - bleeding problems, bruising or swollen lymph nodes Endocrine ROS: negative for - galactorrhea, hair pattern changes, polydipsia/polyuria or temperature intolerance Respiratory ROS: negative for - cough, hemoptysis, shortness of breath or wheezing Cardiovascular ROS: negative for - chest pain, dyspnea on exertion, edema or irregular heartbeat Gastrointestinal ROS: negative for - abdominal pain, diarrhea, hematemesis, nausea/vomiting or stool incontinence Genito-Urinary ROS: negative for - dysuria, hematuria, incontinence or urinary frequency/urgency Musculoskeletal ROS: negative for - joint swelling or muscular weakness Neurological ROS: as noted in HPI Dermatological ROS: negative for rash and skin lesion changes  Physical Examination: Blood pressure 100/66, pulse 83, temperature 98.7  F (37.1 C), temperature source Oral, resp. rate 18, height 5' (1.524 m), weight 44.5 kg (98 lb), SpO2 97 %.  HEENT-  Normocephalic, no lesions, without obvious abnormality.  Normal external eye and conjunctiva.  Normal TM's bilaterally.  Normal auditory canals and external ears. Normal external nose, mucus membranes and septum.  Normal pharynx. Cardiovascular- S1, S2 normal, pulses palpable throughout   Lungs- chest clear, no wheezing, rales, normal symmetric air entry Abdomen- soft, non-tender; bowel sounds normal; no masses,  no organomegaly Extremities- no edema Lymph-no adenopathy palpable Musculoskeletal-no joint tenderness, deformity or swelling Skin-warm and dry, no hyperpigmentation, vitiligo, or suspicious lesions  Neurological Examination   Mental Status: Alert, oriented, thought content appropriate.  Speech fluent without evidence of aphasia.  Able to follow 3 step commands without difficulty. Cranial Nerves: II: Discs flat bilaterally; Visual fields grossly normal, pupils equal, round, reactive to light and accommodation III,IV, VI: ptosis not present, extra-ocular motions  intact bilaterally V,VII: smile symmetric, facial light touch sensation decreased on the left VIII: hearing normal bilaterally IX,X: gag reflex present XI: bilateral shoulder shrug XII: midline tongue extension Motor: Right : Upper extremity   5/5    Left:     Upper extremity   5/5  Lower extremity   5/5     Lower extremity   5/5 Tone and bulk:normal tone throughout; no atrophy noted Sensory: Pinprick and light touch decreased in the left upper and lower extremities Deep Tendon Reflexes: Increased on the left as compared to the right Plantars: Right: mute   Left: mute Cerebellar: Normal finger-to-nose and normal heel-to-shin testing bilaterally Gait: not tested due to safety concerns    Laboratory Studies:  Basic Metabolic Panel: Recent Labs  Lab 08/14/17 1220  NA 140  K 4.1  CL 105  CO2 23   GLUCOSE 88  BUN 8  CREATININE 0.47  CALCIUM 9.2    Liver Function Tests: No results for input(s): AST, ALT, ALKPHOS, BILITOT, PROT, ALBUMIN in the last 168 hours. No results for input(s): LIPASE, AMYLASE in the last 168 hours. No results for input(s): AMMONIA in the last 168 hours.  CBC: Recent Labs  Lab 08/14/17 1220  WBC 5.7  HGB 13.5  HCT 39.2  MCV 94.2  PLT 157    Cardiac Enzymes: Recent Labs  Lab 08/14/17 1220  TROPONINI <0.03    BNP: Invalid input(s): POCBNP  CBG: No results for input(s): GLUCAP in the last 168 hours.  Microbiology: No results found for this or any previous visit.  Coagulation Studies: No results for input(s): LABPROT, INR in the last 72 hours.  Urinalysis: No results for input(s): COLORURINE, LABSPEC, PHURINE, GLUCOSEU, HGBUR, BILIRUBINUR, KETONESUR, PROTEINUR, UROBILINOGEN, NITRITE, LEUKOCYTESUR in the last 168 hours.  Invalid input(s): APPERANCEUR  Lipid Panel:    Component Value Date/Time   CHOL 166 08/15/2017 0448   CHOL 167 02/02/2013 0523   TRIG 42 08/15/2017 0448   TRIG 46 02/02/2013 0523   HDL 101 08/15/2017 0448   HDL 111 (H) 02/02/2013 0523   CHOLHDL 1.6 08/15/2017 0448   VLDL 8 08/15/2017 0448   VLDL 9 02/02/2013 0523   LDLCALC 57 08/15/2017 0448   LDLCALC 47 02/02/2013 0523    HgbA1C:  Lab Results  Component Value Date   HGBA1C 5.2 08/14/2017    Urine Drug Screen:      Component Value Date/Time   LABOPIA POSITIVE (A) 08/14/2017 1504   COCAINSCRNUR POSITIVE (A) 08/14/2017 1504   LABBENZ NONE DETECTED 08/14/2017 1504   AMPHETMU NONE DETECTED 08/14/2017 1504   THCU NONE DETECTED 08/14/2017 1504   LABBARB (A) 08/14/2017 1504    Result not available. Reagent lot number recalled by manufacturer.    Alcohol Level: No results for input(s): ETH in the last 168 hours.  Other results: EKG: sinus rhythm at 95 bpm.  Imaging: Dg Chest 2 View  Result Date: 08/14/2017 CLINICAL DATA:  Seizure.  Chest pain.  EXAM: CHEST - 2 VIEW COMPARISON:  04/02/2015 FINDINGS: The lungs are clear without focal pneumonia, edema, pneumothorax or pleural effusion. The cardiopericardial silhouette is within normal limits for size. Marked thoracolumbar scoliosis again noted. Telemetry leads overlie the chest. IMPRESSION: No active cardiopulmonary disease. Electronically Signed   By: Kennith Center M.D.   On: 08/14/2017 12:46   Ct Head Wo Contrast  Result Date: 08/14/2017 CLINICAL DATA:  Seizure EXAM: CT HEAD WITHOUT CONTRAST TECHNIQUE: Contiguous axial images were obtained from the base of the skull  through the vertex without intravenous contrast. COMPARISON:  August 22, 2015 FINDINGS: Brain: The ventricles are normal in size and configuration. There is no intracranial mass, hemorrhage, extra-axial fluid collection, or midline shift. Gray-white compartments appear normal. No acute infarct evident. Vascular: No hyperdense vessel.  No vascular calcification evident. Skull: Bony calvarium appears intact. Sinuses/Orbits: There is mild mucosal thickening in several ethmoid air cells. Other visualized paranasal sinuses are clear. Visualized orbits appear symmetric bilaterally. Other: Visualized mastoid air cells are clear. IMPRESSION: Mild mucosal thickening in several ethmoid air cells. Study otherwise unremarkable. Electronically Signed   By: Bretta Bang III M.D.   On: 08/14/2017 13:31   Mr Brain Wo Contrast  Result Date: 08/15/2017 CLINICAL DATA:  Witnessed seizure last night. LEFT extremity numbness and tingling. History of cocaine abuse, seizure disorder. EXAM: MRI HEAD WITHOUT CONTRAST MRA HEAD WITHOUT CONTRAST TECHNIQUE: Multiplanar, multiecho pulse sequences of the brain and surrounding structures were obtained without intravenous contrast. Angiographic images of the head were obtained using MRA technique without contrast. COMPARISON:  CT HEAD August 14, 2017 an MRI head February 01, 2013 FINDINGS: MRI HEAD FINDINGS-mild motion  degraded examination. INTRACRANIAL CONTENTS: No reduced diffusion to suggest acute ischemia. No susceptibility artifact to suggest hemorrhage. The ventricles and sulci are normal for patient's age. Scattered subcentimeter supratentorial white matter FLAIR T2 hyperintensities are unchanged. 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 No suspicious parenchymal signal, masses, mass effect. No abnormal extra-axial fluid collections. No extra-axial masses. VASCULAR: Normal major intracranial vascular flow voids present at skull base. SKULL AND UPPER CERVICAL SPINE: No abnormal sellar expansion. No suspicious calvarial bone marrow signal. Craniocervical junction maintained. SINUSES/ORBITS: The mastoid air-cells and included paranasal sinuses are well-aerated. Hypoplastic frontal sinuses. The included ocular globes and orbital contents are non-suspicious. OTHER: None. MRA HEAD FINDINGS-moderately motion degraded examination. ANTERIOR CIRCULATION: Normal flow related enhancement of the included cervical, petrous, cavernous and supraclinoid internal carotid arteries. Patent anterior communicating artery. Patent anterior and middle cerebral arteries, signal loss at bifurcation most compatible with motion artifact. Limited assessment for aneurysm or vasculopathy due to patient motion. POSTERIOR CIRCULATION: LEFT vertebral artery is dominant. Vertebrobasilar arteries are patent, with normal flow related enhancement of the main branch vessels. Patent posterior cerebral arteries. Bilateral posterior communicating arteries present. ANATOMIC VARIANTS: None. Source images and MIP images were reviewed. IMPRESSION: MRI HEAD: 1. No acute intracranial process on this mildly motion degraded examination. 2. Stable mild white matter changes most compatible chronic small vessel ischemic disease. 3. 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 MRA HEAD: 1. Moderately motion degraded examination. 2. No emergent large vessel occlusion or convincing  flow-limiting stenosis. Electronically Signed   By: Awilda Metro M.D.   On: 08/15/2017 00:28   US Carotid Bilateral (at [facility] And [facility] Only)  Result Date: 08/14/2017 CLINICAL DATA:  CVA, syncope, coronary artery disease and tobacco use. EXAM: BILATERAL CAROTID DUPLEX ULTRASOUND TECHNIQUE: Wallace Cullens scale imaging, color Doppler and duplex ultrasound were performed of bilateral carotid and vertebral arteries in the neck. COMPARISON:  None. FINDINGS: Criteria: Quantification of carotid stenosis is based on velocity parameters that correlate the residual internal carotid diameter with NASCET-based stenosis levels, using the diameter of the distal internal carotid lumen as the denominator for stenosis measurement. The following velocity measurements were obtained: RIGHT ICA:  94/27 cm/sec CCA:  144/28 cm/sec SYSTOLIC ICA/CCA RATIO:  0.7 ECA:  119 cm/sec LEFT ICA:  111/47 cm/sec CCA:  152/31 cm/sec SYSTOLIC ICA/CCA RATIO:  0.7 ECA:  129 cm/sec RIGHT CAROTID ARTERY: Mild amount  of plaque at the level of the carotid bulb. There is a mild amount of noncalcified plaque at the ICA origin. Estimated right ICA stenosis is less than 50%. RIGHT VERTEBRAL ARTERY: Antegrade flow with normal waveform and velocity. LEFT CAROTID ARTERY: Minimal plaque in the carotid bulb and proximal ICA. Estimated left ICA stenosis is less than 50%. LEFT VERTEBRAL ARTERY: Antegrade flow with normal waveform and velocity. IMPRESSION: Mild plaque at the level of both carotid bulbs and proximal internal carotid arteries. No significant stenosis identified with estimated bilateral ICA stenoses of less than 50%. Electronically Signed   By: Irish Lack M.D.   On: 08/14/2017 16:44   Mr Maxine Glenn Head/brain NU Cm  Result Date: 08/15/2017 CLINICAL DATA:  Witnessed seizure last night. LEFT extremity numbness and tingling. History of cocaine abuse, seizure disorder. EXAM: MRI HEAD WITHOUT CONTRAST MRA HEAD WITHOUT CONTRAST TECHNIQUE: Multiplanar, multiecho  pulse sequences of the brain and surrounding structures were obtained without intravenous contrast. Angiographic images of the head were obtained using MRA technique without contrast. COMPARISON:  CT HEAD August 14, 2017 an MRI head February 01, 2013 FINDINGS: MRI HEAD FINDINGS-mild motion degraded examination. INTRACRANIAL CONTENTS: No reduced diffusion to suggest acute ischemia. No susceptibility artifact to suggest hemorrhage. The ventricles and sulci are normal for patient's age. Scattered subcentimeter supratentorial white matter FLAIR T2 hyperintensities are unchanged. 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 No suspicious parenchymal signal, masses, mass effect. No abnormal extra-axial fluid collections. No extra-axial masses. VASCULAR: Normal major intracranial vascular flow voids present at skull base. SKULL AND UPPER CERVICAL SPINE: No abnormal sellar expansion. No suspicious calvarial bone marrow signal. Craniocervical junction maintained. SINUSES/ORBITS: The mastoid air-cells and included paranasal sinuses are well-aerated. Hypoplastic frontal sinuses. The included ocular globes and orbital contents are non-suspicious. OTHER: None. MRA HEAD FINDINGS-moderately motion degraded examination. ANTERIOR CIRCULATION: Normal flow related enhancement of the included cervical, petrous, cavernous and supraclinoid internal carotid arteries. Patent anterior communicating artery. Patent anterior and middle cerebral arteries, signal loss at bifurcation most compatible with motion artifact. Limited assessment for aneurysm or vasculopathy due to patient motion. POSTERIOR CIRCULATION: LEFT vertebral artery is dominant. Vertebrobasilar arteries are patent, with normal flow related enhancement of the main branch vessels. Patent posterior cerebral arteries. Bilateral posterior communicating arteries present. ANATOMIC VARIANTS: None. Source images and MIP images were reviewed. IMPRESSION: MRI HEAD: 1. No acute intracranial process  on this mildly motion degraded examination. 2. Stable mild white matter changes most compatible chronic small vessel ischemic disease. 3. 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 MRA HEAD: 1. Moderately motion degraded examination. 2. No emergent large vessel occlusion or convincing flow-limiting stenosis. Electronically Signed   By: Awilda Metro M.D.   On: 08/15/2017 00:28    Assessment: 52 y.o. female presenting with seizure and left sided numbness.  Patient reports that her seizures are not usually associated with left sided numbness.  MRI of the brain performed and shows no acute changes.  UDS positive for cocaine and opiates.  Suspect MR negative stroke related to substance abuse.  Carotid dopplers show no evidence of hemodynamically significant stenosis.  Echocardiogram shows no cardiac source of emboli with an EF of 55-60%.  A1c 5.2, LDL 57.  Based on previous anticonvulsant levels on an outpatient basis do not believe that the patient is compliant with her anticonvulsants.  Would continue at home doses.    Stroke Risk Factors - smoking  Plan: 1. PT consult, OT consult, Speech consult 2. Prophylactic therapy-Antiplatelet med: Aspirin - dose 81mg  daily  3. NPO until RN stroke swallow screen 4. Telemetry monitoring 5. Frequent neuro checks 6. Smoking cessation as well as illicit drug use counseling recommended.   7. Patient to continue anticonvulsant dosing at pre-admission doses.   8. Patient unable to drive, operate heavy machinery, perform activities at heights and participate in water activities until release by outpatient physician.   Thana Farr, MD Neurology [phone] 08/15/2017, 12:09 PM

## 2017-08-15 NOTE — Evaluation (Signed)
Physical Therapy Evaluation Patient Details Name: [name] MRN: [id number] DOB: [birth year] Today's Date: 08/15/2017   History of Present Illness  Patient is 52 yo female that presented to ED with complaints of L sided numbness/weakness after a seizure 08/14/17. PMH of bipolar disorder, schizophrenia, seizure disorder, polysubstance abuse, prior history of stroke without any residual neurological deficits   Clinical Impression  Patient A&Ox4 at start of session, no complaints of pain though patient reports she still has "tingling" on L side. The patient reports prior to admission was intermittently independent with ADLS and ambulation. States that sometimes she needed assistance with bathing, and sometimes would walk with a walker. Patient reports that she recently threw her walker out since a leg was broken. Upon assessment patient demonstrates generalized weakness, decreased balance, decreased activity tolerance and endurance as well as impaired mobility/ambulation. Bed mobility and transfers with supervision, ambulation with hand held assist and patient reaching for support, ~29ft with patient reporting fatigue. Gait unsteady, slow, weaving. The patient would benefit from further skilled PT to address these deficits to maximize independence, mobility, and safety.     Follow Up Recommendations Home health PT    Equipment Recommendations  Rolling walker with 5" wheels    Recommendations for Other Services       Precautions / Restrictions Precautions Precautions: Fall Restrictions Weight Bearing Restrictions: No      Mobility  Bed Mobility Overal bed mobility: Needs Assistance Bed Mobility: Supine to Sit     Supine to sit: Supervision        Transfers Overall transfer level: Needs assistance   Transfers: Sit to/from Stand Sit to Stand: Supervision            Ambulation/Gait   Gait Distance (Feet): 80 Feet Assistive device: 1 person hand held assist        General Gait Details: patient needs handheld assist for ambulation, also reaching for walls/support. Unsteadiness noted.  Stairs            Wheelchair Mobility    Modified Rankin (Stroke Patients Only)       Balance Overall balance assessment: Needs assistance Sitting-balance support: Feet unsupported Sitting balance-Leahy Scale: Good     Standing balance support: Single extremity supported Standing balance-Leahy Scale: Fair                               Pertinent Vitals/Pain Pain Assessment: No/denies pain Pain Location: Patient reports she still has tingling on her L side.    Home Living Family/patient expects to be discharged to:: Private residence Living Arrangements: Spouse/significant other Available Help at Discharge: Friend(s) Type of Home: Apartment Home Access: Stairs to enter Entrance Stairs-Rails: Left Entrance Stairs-Number of Steps: 20 Home Layout: Two level (home on second floor) Home Equipment: Grab bars - tub/shower      Prior Function Level of Independence: Needs assistance   Gait / Transfers Assistance Needed: Patient reports that she was using a walker "every now and then". States the walker leg was broken recently so she threw it out.  ADL's / Homemaking Assistance Needed: patient able to complete activities, just reports she just needs more time.  Comments: Pt.'s boyfriend works during the day.     Hand Dominance   Dominant Hand: Right    Extremity/Trunk Assessment   Upper Extremity Assessment Upper Extremity Assessment: Generalized weakness;Defer to OT evaluation;LUE deficits/detail;RUE deficits/detail LUE Deficits / Details: LUE strength 4/5 overall, LUE Sensation: (Intact  propriception. inconsistencies with light touch sensation.) LUE Coordination: (intact)    Lower Extremity Assessment Lower Extremity Assessment: Generalized weakness;RLE deficits/detail;LLE deficits/detail RLE Deficits / Details: 3+/5  overall LLE Deficits / Details: 3+/5 overall       Communication   Communication: No difficulties  Cognition Arousal/Alertness: Awake/alert Behavior During Therapy: WFL for tasks assessed/performed Overall Cognitive Status: Within Functional Limits for tasks assessed                                        General Comments      Exercises     Assessment/Plan    PT Assessment Patient needs continued PT services  PT Problem List Decreased strength;Decreased range of motion;Decreased activity tolerance;Decreased knowledge of use of DME;Decreased balance;Decreased mobility       PT Treatment Interventions DME instruction;Therapeutic exercise;Gait training;Balance training;Stair training;Functional mobility training;Neuromuscular re-education;Therapeutic activities;Patient/family education    PT Goals (Current goals can be found in the Care Plan section)  Acute Rehab PT Goals Patient Stated Goal: to return home, get strength back PT Goal Formulation: With patient Time For Goal Achievement: 08/29/17 Potential to Achieve Goals: Good    Frequency Min 2X/week   Barriers to discharge        Co-evaluation               AM-PAC PT "6 Clicks" Daily Activity  Outcome Measure Difficulty turning over in bed (including adjusting bedclothes, sheets and blankets)?: None Difficulty moving from lying on back to sitting on the side of the bed? : A Little Difficulty sitting down on and standing up from a chair with arms (e.g., wheelchair, bedside commode, etc,.)?: A Little Help needed moving to and from a bed to chair (including a wheelchair)?: A Little Help needed walking in hospital room?: A Little Help needed climbing 3-5 steps with a railing? : A Lot 6 Click Score: 18    End of Session Equipment Utilized During Treatment: Gait belt Activity Tolerance: Patient limited by fatigue Patient left: in bed;with call bell/phone within reach;with bed alarm set Nurse  Communication: Mobility status PT Visit Diagnosis: Unsteadiness on feet (R26.81);Other abnormalities of gait and mobility (R26.89);Muscle weakness (generalized) (M62.81)    Time: 9528-4132 PT Time Calculation (min) (ACUTE ONLY): 14 min   Charges:   PT Evaluation $PT Eval Low Complexity: 1 Low     PT G Codes:        Olga Coaster PT, DPT 2:10 PM,08/15/17 [phone]

## 2017-08-15 NOTE — Plan of Care (Signed)
  Problem: Education: Goal: Knowledge of General Education information will improve Outcome: Progressing   Problem: Health Behavior/Discharge Planning: Goal: Ability to manage health-related needs will improve Outcome: Progressing   Problem: Clinical Measurements: Goal: Ability to maintain clinical measurements within normal limits will improve Outcome: Progressing Goal: Will remain free from infection Outcome: Progressing Goal: Diagnostic test results will improve Outcome: Progressing Goal: Respiratory complications will improve Outcome: Progressing Goal: Cardiovascular complication will be avoided Outcome: Progressing   Problem: Activity: Goal: Risk for activity intolerance will decrease Outcome: Progressing   Problem: Nutrition: Goal: Adequate nutrition will be maintained Outcome: Progressing   Problem: Coping: Goal: Level of anxiety will decrease Outcome: Progressing   Problem: Elimination: Goal: Will not experience complications related to bowel motility Outcome: Progressing Goal: Will not experience complications related to urinary retention Outcome: Progressing   Problem: Safety: Goal: Ability to remain free from injury will improve Outcome: Progressing   Problem: Skin Integrity: Goal: Risk for impaired skin integrity will decrease Outcome: Progressing   

## 2017-08-15 NOTE — Care Management Note (Addendum)
Case Management Note  Patient Details  Name: [name] MRN: [id number] Date of Birth: 04/13/1965  Subjective/Objective: Admitted to [facility] under observation status with the diagnosis of seizures, Lives with friend, Billey GoslingCharlie [phone]). [facility] in the past. [facility] and some medication needs. [facility] for other medication needs, Medication Management in the past. Takes care of all basic activities of daily living herself. Uses [employer] for transport.  Unable to find transportation home.                   Action/Plan: Taxi voucher provided Physical therapy evaluation completed.  Recommending Home Health, physical therapy, and rolling walker   Expected Discharge Date:                  Expected Discharge Plan:     In-House Referral:    yes Discharge planning Services   yes  Post Acute Care Choice:   yes Choice offered to:   patient  DME Arranged:   yes DME Agency:    [facility] Arranged:   yes HH Agency:   [facility]  Status of Service:     If discussed at Long Length of Stay Meetings, dates discussed:    Additional Comments:  Gwenette GreetBrenda S Earleen Aoun, RN MSN CCM Care Management [phone] 08/15/2017, 8:48 AM

## 2017-08-15 NOTE — Progress Notes (Signed)
*  PRELIMINARY RESULTS* Echocardiogram 2D Echocardiogram has been performed.  Cristela BlueHege, Antoinett Dorman 08/15/2017, 8:29 AM

## 2017-08-15 NOTE — Plan of Care (Signed)
Pt is d/ced home.  Numbness/weakness r/t seizure disorder.  Pt still experiencing.  Pt sees neurologist outpt.  No changes made to medications.  Pt will receive home health and we got her rolling walker.  Dr and I educated that drug use and smoking increases her risk of stroke and seizure dramatically.  She sd she's cut down on smoking.  Used to be a 2-pack a day smoker.  Pt will go home via taxi voucher.

## 2017-08-15 NOTE — Evaluation (Addendum)
Occupational Therapy Evaluation Patient Details Name: [name] MRN: [id number] DOB: 1965-07-31 Today's Date: 08/15/2017    History of Present Illness Pt. is a 52 y.o. female who was admitted to [facility] with seizures, LUE, LLE weakness. Pt. PMHx includes: Polysubstance abuse, and Alcohol Abuse.   Clinical Impression   Pt. Presents with LUE weakness which affects performance with ADL and IADL functioning. Pt. Resides at home with her boyfriend. Pt. Was independent with ADLs, and IADL functioning: including meal preparation. Pt. Reports having had difficulty keeping track of when to take her medication, and remembering whether or not she took her medication.  Pt. Would benefit from a pillbox to assist with daily medication management. Pt. Is requesting an in home aide, as she reports being worried about having a seizure when her boyriend is at work. Pt. Could benefit from OT services for ADL training, A/E training, neuromuscular re-ed, UE there. Ex, and pt. education about home modification, and DME.      Follow Up Recommendations  No OT follow up(Pt. is requesting an in home aide.)    Equipment Recommendations       Recommendations for Other Services       Precautions / Restrictions        Mobility Bed Mobility    Independent              Transfers    Supervision with RW                  Balance                                           ADL either performed or assessed with clinical judgement   ADL Overall ADL's : Needs assistance/impaired Eating/Feeding: Set up;Independent   Grooming: Set up;Independent   Upper Body Bathing: Set up;Independent   Lower Body Bathing: Set up;Minimal assistance   Upper Body Dressing : Set up;Independent   Lower Body Dressing: Set up;Supervision/safety   Toilet Transfer: Supervision/safety           Functional mobility during ADLs: Supervision/safety       Vision Baseline Vision/History:  (Pt. reports intermittent blurriness.) Patient Visual Report: No change from baseline       Perception     Praxis      Pertinent Vitals/Pain Pain Assessment: 0-10 Pain Score: 6  Pain Descriptors / Indicators: Aching     Hand Dominance Right   Extremity/Trunk Assessment Upper Extremity Assessment Upper Extremity Assessment: Overall WFL for tasks assessed;LUE deficits/detail LUE Deficits / Details: LUE strength 4/5 overall, LUE Sensation: (Intact propriception. inconsistencies with light touch sensation.) LUE Coordination: (intact)           Communication Communication Communication: No difficulties   Cognition Arousal/Alertness: Awake/alert Behavior During Therapy: WFL for tasks assessed/performed Overall Cognitive Status: Within Functional Limits for tasks assessed                                     General Comments       Exercises     Shoulder Instructions      Home Living Family/patient expects to be discharged to:: Private residence Living Arrangements: Spouse/significant other Available Help at Discharge: Friend(s) Type of Home: Apartment Home Access: Stairs to enter [employer] of Steps: 20 Entrance Stairs-Rails: Left Home  Layout: (2nd floor apartment)               Home Equipment: None          Prior Functioning/Environment Level of Independence: Independent with assistive device(s)        Comments: Pt.'s boyfriend works during the day.        OT Problem List: Decreased strength;Decreased range of motion;Impaired UE functional use;Decreased coordination;Decreased activity tolerance;Decreased knowledge of use of DME or AE;Pain      OT Treatment/Interventions: Self-care/ADL training;Therapeutic exercise;Patient/family education;Therapeutic activities;Neuromuscular education    OT Goals(Current goals can be found in the care plan section) Acute Rehab OT Goals Patient Stated Goal: To return home OT Goal  Formulation: With patient Potential to Achieve Goals: Good  OT Frequency: Min 2X/week   Barriers to D/C:            Co-evaluation              AM-PAC PT "6 Clicks" Daily Activity     Outcome Measure Help from another person eating meals?: None Help from another person taking care of personal grooming?: None Help from another person toileting, which includes using toliet, bedpan, or urinal?: A Little Help from another person bathing (including washing, rinsing, drying)?: A Little Help from another person to put on and taking off regular upper body clothing?: None Help from another person to put on and taking off regular lower body clothing?: A Little 6 Click Score: 21   End of Session    Activity Tolerance: Patient tolerated treatment well Patient left: in bed  OT Visit Diagnosis: Muscle weakness (generalized) (M62.81)                Time: 2956-2130 OT Time Calculation (min): 23 min Charges:  OT General Charges $OT Visit: 1 Visit OT Evaluation $OT Eval Moderate Complexity: 1 Mod G-Codes:     Olegario Messier, MS, OTR/L  Olegario Messier 08/15/2017, 11:50 AM

## 2017-08-15 NOTE — Progress Notes (Signed)
Lovenox changed to 30 mg daily for TBW <45kg. 

## 2017-08-15 NOTE — Progress Notes (Signed)
[facility] [place] [facility]   PATIENT NAME: [name]    MR#:  [id number]  DATE OF BIRTH:  1965/05/17  SUBJECTIVE:  CHIEF COMPLAINT:   Chief Complaint  Patient presents with  . Chest Pain  . Seizures   - no further seizures. Patient states that she has been compliant with her medications and follows with outpatient neurologist - MRI brain negative for acute infarct  REVIEW OF SYSTEMS:  Review of Systems  Constitutional: Positive for malaise/fatigue. Negative for chills and fever.  HENT: Negative for congestion, ear discharge, hearing loss and nosebleeds.   Eyes: Negative for blurred vision and double vision.  Respiratory: Negative for cough, shortness of breath and wheezing.   Cardiovascular: Negative for chest pain and palpitations.  Gastrointestinal: Negative for abdominal pain, constipation, diarrhea, nausea and vomiting.  Genitourinary: Negative for dysuria.  Musculoskeletal: Positive for myalgias.  Neurological: Positive for seizures. Negative for dizziness, focal weakness, weakness and headaches.    DRUG ALLERGIES:   Allergies  Allergen Reactions  . Flagyl [Metronidazole] Other (See Comments)    "my eyes go back in the back of my head"    VITALS:  Blood pressure 101/82, pulse 82, temperature 98.4 F (36.9 C), temperature source Oral, resp. rate 18, height 5' (1.524 m), weight 44.5 kg (98 lb), SpO2 97 %.  PHYSICAL EXAMINATION:  Physical Exam  GENERAL:  52 y.o.-year-old patient sitting in the bed with no acute distress.  EYES: Pupils equal, round, reactive to light and accommodation. No scleral icterus. Extraocular muscles intact.  HEENT: Head atraumatic, normocephalic. Oropharynx and nasopharynx clear.  NECK:  Supple, no jugular venous distention. No thyroid enlargement, no tenderness.  LUNGS: Normal breath sounds bilaterally, no wheezing, rales,rhonchi or crepitation. No use of accessory muscles of respiration. Decreased bibasilar  breath sounds CARDIOVASCULAR: S1, S2 normal. No murmurs, rubs, or gallops.  ABDOMEN: Soft, nontender, nondistended. Bowel sounds present. No organomegaly or mass.  EXTREMITIES: No pedal edema, cyanosis, or clubbing.  NEUROLOGIC: Cranial nerves II through XII are intact. Muscle strength 5/5 in all extremities. Sensation intact. Gait steady when using a walker.  PSYCHIATRIC: The patient is alert and oriented x 3.  SKIN: No obvious rash, lesion, or ulcer.    LABORATORY PANEL:   CBC Recent Labs  Lab 08/14/17 1220  WBC 5.7  HGB 13.5  HCT 39.2  PLT 157   ------------------------------------------------------------------------------------------------------------------  Chemistries  Recent Labs  Lab 08/14/17 1220  NA 140  K 4.1  CL 105  CO2 23  GLUCOSE 88  BUN 8  CREATININE 0.47  CALCIUM 9.2   ------------------------------------------------------------------------------------------------------------------  Cardiac Enzymes Recent Labs  Lab 08/14/17 1220  TROPONINI <0.03   ------------------------------------------------------------------------------------------------------------------  RADIOLOGY:  Dg Chest 2 View  Result Date: 08/14/2017 CLINICAL DATA:  Seizure.  Chest pain. EXAM: CHEST - 2 VIEW COMPARISON:  04/02/2015 FINDINGS: The lungs are clear without focal pneumonia, edema, pneumothorax or pleural effusion. The cardiopericardial silhouette is within normal limits for size. Marked thoracolumbar scoliosis again noted. Telemetry leads overlie the chest. IMPRESSION: No active cardiopulmonary disease. Electronically Signed   By: Kennith Center M.D.   On: 08/14/2017 12:46   Ct Head Wo Contrast  Result Date: 08/14/2017 CLINICAL DATA:  Seizure EXAM: CT HEAD WITHOUT CONTRAST TECHNIQUE: Contiguous axial images were obtained from the base of the skull through the vertex without intravenous contrast. COMPARISON:  August 22, 2015 FINDINGS: Brain: The ventricles are normal in size  and configuration. There is no intracranial mass, hemorrhage, extra-axial fluid  collection, or midline shift. Gray-white compartments appear normal. No acute infarct evident. Vascular: No hyperdense vessel.  No vascular calcification evident. Skull: Bony calvarium appears intact. Sinuses/Orbits: There is mild mucosal thickening in several ethmoid air cells. Other visualized paranasal sinuses are clear. Visualized orbits appear symmetric bilaterally. Other: Visualized mastoid air cells are clear. IMPRESSION: Mild mucosal thickening in several ethmoid air cells. Study otherwise unremarkable. Electronically Signed   By: Bretta Bang III M.D.   On: 08/14/2017 13:31   Mr Brain Wo Contrast  Result Date: 08/15/2017 CLINICAL DATA:  Witnessed seizure last night. LEFT extremity numbness and tingling. History of cocaine abuse, seizure disorder. EXAM: MRI HEAD WITHOUT CONTRAST MRA HEAD WITHOUT CONTRAST TECHNIQUE: Multiplanar, multiecho pulse sequences of the brain and surrounding structures were obtained without intravenous contrast. Angiographic images of the head were obtained using MRA technique without contrast. COMPARISON:  CT HEAD August 14, 2017 an MRI head February 01, 2013 FINDINGS: MRI HEAD FINDINGS-mild motion degraded examination. INTRACRANIAL CONTENTS: No reduced diffusion to suggest acute ischemia. No susceptibility artifact to suggest hemorrhage. The ventricles and sulci are normal for patient's age. Scattered subcentimeter supratentorial white matter FLAIR T2 hyperintensities are unchanged. 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 No suspicious parenchymal signal, masses, mass effect. No abnormal extra-axial fluid collections. No extra-axial masses. VASCULAR: Normal major intracranial vascular flow voids present at skull base. SKULL AND UPPER CERVICAL SPINE: No abnormal sellar expansion. No suspicious calvarial bone marrow signal. Craniocervical junction maintained. SINUSES/ORBITS: The mastoid air-cells and  included paranasal sinuses are well-aerated. Hypoplastic frontal sinuses. The included ocular globes and orbital contents are non-suspicious. OTHER: None. MRA HEAD FINDINGS-moderately motion degraded examination. ANTERIOR CIRCULATION: Normal flow related enhancement of the included cervical, petrous, cavernous and supraclinoid internal carotid arteries. Patent anterior communicating artery. Patent anterior and middle cerebral arteries, signal loss at bifurcation most compatible with motion artifact. Limited assessment for aneurysm or vasculopathy due to patient motion. POSTERIOR CIRCULATION: LEFT vertebral artery is dominant. Vertebrobasilar arteries are patent, with normal flow related enhancement of the main branch vessels. Patent posterior cerebral arteries. Bilateral posterior communicating arteries present. ANATOMIC VARIANTS: None. Source images and MIP images were reviewed. IMPRESSION: MRI HEAD: 1. No acute intracranial process on this mildly motion degraded examination. 2. Stable mild white matter changes most compatible chronic small vessel ischemic disease. 3. 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 MRA HEAD: 1. Moderately motion degraded examination. 2. No emergent large vessel occlusion or convincing flow-limiting stenosis. Electronically Signed   By: Awilda Metro M.D.   On: 08/15/2017 00:28   US Carotid Bilateral (at [facility] Only)  Result Date: 08/14/2017 CLINICAL DATA:  CVA, syncope, coronary artery disease and tobacco use. EXAM: BILATERAL CAROTID DUPLEX ULTRASOUND TECHNIQUE: Wallace Cullens scale imaging, color Doppler and duplex ultrasound were performed of bilateral carotid and vertebral arteries in the neck. COMPARISON:  None. FINDINGS: Criteria: Quantification of carotid stenosis is based on velocity parameters that correlate the residual internal carotid diameter with NASCET-based stenosis levels, using the diameter of the distal internal carotid lumen as the denominator for stenosis measurement.  The following velocity measurements were obtained: RIGHT ICA:  94/27 cm/sec CCA:  144/28 cm/sec SYSTOLIC ICA/CCA RATIO:  0.7 ECA:  119 cm/sec LEFT ICA:  111/47 cm/sec CCA:  152/31 cm/sec SYSTOLIC ICA/CCA RATIO:  0.7 ECA:  129 cm/sec RIGHT CAROTID ARTERY: Mild amount of plaque at the level of the carotid bulb. There is a mild amount of noncalcified plaque at the ICA origin. Estimated right ICA stenosis is less than 50%.  RIGHT VERTEBRAL ARTERY: Antegrade flow with normal waveform and velocity. LEFT CAROTID ARTERY: Minimal plaque in the carotid bulb and proximal ICA. Estimated left ICA stenosis is less than 50%. LEFT VERTEBRAL ARTERY: Antegrade flow with normal waveform and velocity. IMPRESSION: Mild plaque at the level of both carotid bulbs and proximal internal carotid arteries. No significant stenosis identified with estimated bilateral ICA stenoses of less than 50%. Electronically Signed   By: Irish LackGlenn  Yamagata M.D.   On: 08/14/2017 16:44   Mr Maxine GlennMra Head/brain JXWo Cm  Result Date: 08/15/2017 CLINICAL DATA:  Witnessed seizure last night. LEFT extremity numbness and tingling. History of cocaine abuse, seizure disorder. EXAM: MRI HEAD WITHOUT CONTRAST MRA HEAD WITHOUT CONTRAST TECHNIQUE: Multiplanar, multiecho pulse sequences of the brain and surrounding structures were obtained without intravenous contrast. Angiographic images of the head were obtained using MRA technique without contrast. COMPARISON:  CT HEAD August 14, 2017 an MRI head February 01, 2013 FINDINGS: MRI HEAD FINDINGS-mild motion degraded examination. INTRACRANIAL CONTENTS: No reduced diffusion to suggest acute ischemia. No susceptibility artifact to suggest hemorrhage. The ventricles and sulci are normal for patient's age. Scattered subcentimeter supratentorial white matter FLAIR T2 hyperintensities are unchanged. 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 No suspicious parenchymal signal, masses, mass effect. No abnormal extra-axial fluid collections. No  extra-axial masses. VASCULAR: Normal major intracranial vascular flow voids present at skull base. SKULL AND UPPER CERVICAL SPINE: No abnormal sellar expansion. No suspicious calvarial bone marrow signal. Craniocervical junction maintained. SINUSES/ORBITS: The mastoid air-cells and included paranasal sinuses are well-aerated. Hypoplastic frontal sinuses. The included ocular globes and orbital contents are non-suspicious. OTHER: None. MRA HEAD FINDINGS-moderately motion degraded examination. ANTERIOR CIRCULATION: Normal flow related enhancement of the included cervical, petrous, cavernous and supraclinoid internal carotid arteries. Patent anterior communicating artery. Patent anterior and middle cerebral arteries, signal loss at bifurcation most compatible with motion artifact. Limited assessment for aneurysm or vasculopathy due to patient motion. POSTERIOR CIRCULATION: LEFT vertebral artery is dominant. Vertebrobasilar arteries are patent, with normal flow related enhancement of the main branch vessels. Patent posterior cerebral arteries. Bilateral posterior communicating arteries present. ANATOMIC VARIANTS: None. Source images and MIP images were reviewed. IMPRESSION: MRI HEAD: 1. No acute intracranial process on this mildly motion degraded examination. 2. Stable mild white matter changes most compatible chronic small vessel ischemic disease. 3. 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 MRA HEAD: 1. Moderately motion degraded examination. 2. No emergent large vessel occlusion or convincing flow-limiting stenosis. Electronically Signed   By: Awilda Metroourtnay  Bloomer M.D.   On: 08/15/2017 00:28    EKG:   Orders placed or performed during the hospital encounter of 08/14/17  . EKG 12-Lead  . EKG 12-Lead  . ED EKG within 10 minutes  . ED EKG within 10 minutes    ASSESSMENT AND PLAN:   52 year old female with past medical history significant for bipolar disorder, schizophrenia, seizure disorder, polysubstance abuse,  prior history of stroke without any residual neurological deficits presents to hospital secondary to seizures and left sided weakness.  1. 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 Seizure disorder-follows with outpatient neurologist. Last visit was last month. -No change in her medications recently. Takes Depakote 500 mg in the morning and thousand milligrams at bedtime and phenytoin 300 mg at bedtime -states she has been compliant. Levels are low however -pending neurology  consult. -Will need to cut down on alcohol and cocaine which can be lowering her seizure threshold.  3. Schizoaffective disorder-following with behavioral [facility] clinic. Does have hallucinations at baseline. -Trazodone at bedtime for insomnia and continue outpatient follow up  4. Polysubstance abuse-urine talk screen positive for cocaine. Also known history of alcohol use. Strongly counseled against it  5. DVT prophylaxis-Lovenox   Updated boyfriend at bedside. Likely discharge later today   All the records are reviewed and case discussed with Care Management/Social Workerr. Management plans discussed with the patient, family and they are in agreement.  CODE STATUS: Full Code  TOTAL TIME TAKING CARE OF THIS PATIENT: 38 minutes.   POSSIBLE D/C TODAY or TOMORROW, DEPENDING ON CLINICAL CONDITION.   Enid Baas M.D on 08/15/2017 at 9:10 AM  Between 7am to 6pm - Pager - [phone]  After 6pm go to [email or web address] - password [email or web address]  [facility]  Office  [phone]  CC: Primary care physician; System, Provider Not In

## 2017-08-16 LAB — HIV ANTIBODY (ROUTINE TESTING W REFLEX): HIV SCREEN 4TH GENERATION: NONREACTIVE

## 2017-08-20 ENCOUNTER — Telehealth: Payer: Self-pay

## 2017-08-20 NOTE — Telephone Encounter (Signed)
Spoke with Amy about [facility] going into Ms. Pokorny's home for the next 6 weeks to go over medications therapy as well as self care. Verbal approval was given and a signed consent is being faxed.

## 2017-08-26 DIAGNOSIS — R222 Localized swelling, mass and lump, trunk: Secondary | ICD-10-CM

## 2017-09-25 ENCOUNTER — Ambulatory Visit: Payer: Self-pay

## 2017-10-07 ENCOUNTER — Ambulatory Visit: Payer: Self-pay

## 2017-10-30 ENCOUNTER — Telehealth: Payer: Self-pay | Admitting: Pharmacy Technician

## 2017-10-30 NOTE — Telephone Encounter (Signed)
Patient has Medicaid.  No longer meets [facility]'s eligibility criteria.  Patient acknowledged that she understood that [facility] would no longer be able to provide medication assistance.  Sherilyn Dacosta Care Manager [facility]

## 2017-12-22 ENCOUNTER — Other Ambulatory Visit: Payer: Self-pay | Admitting: Primary Care

## 2017-12-22 DIAGNOSIS — Z0000 Encounter for general adult medical examination without abnormal findings: Secondary | ICD-10-CM

## 2017-12-23 ENCOUNTER — Other Ambulatory Visit: Payer: Self-pay | Admitting: Family Medicine

## 2017-12-23 DIAGNOSIS — Z1231 Encounter for screening mammogram for malignant neoplasm of breast: Secondary | ICD-10-CM

## 2017-12-24 ENCOUNTER — Telehealth: Payer: Self-pay | Admitting: *Deleted

## 2017-12-24 NOTE — Telephone Encounter (Signed)
Received referral for low dose lung cancer screening CT scan. Message left at phone number listed in EMR that she will be eligible for lung screening age criteria in May 2020 and we will contact her at that time to schedule.

## 2018-01-07 ENCOUNTER — Observation Stay: Payer: Medicare HMO

## 2018-01-07 ENCOUNTER — Other Ambulatory Visit: Payer: Self-pay

## 2018-01-07 ENCOUNTER — Emergency Department: Payer: Medicare HMO

## 2018-01-07 ENCOUNTER — Encounter: Payer: Self-pay | Admitting: *Deleted

## 2018-01-07 ENCOUNTER — Observation Stay
Admission: EM | Admit: 2018-01-07 | Discharge: 2018-01-09 | Disposition: A | Discharge status: Home / Self Care | Payer: Medicare HMO | Attending: Internal Medicine | Admitting: Internal Medicine

## 2018-01-07 DIAGNOSIS — M419 Scoliosis, unspecified: Secondary | ICD-10-CM | POA: Insufficient documentation

## 2018-01-07 DIAGNOSIS — M62838 Other muscle spasm: Secondary | ICD-10-CM | POA: Insufficient documentation

## 2018-01-07 DIAGNOSIS — G40909 Epilepsy, unspecified, not intractable, without status epilepticus: Secondary | ICD-10-CM

## 2018-01-07 DIAGNOSIS — E876 Hypokalemia: Secondary | ICD-10-CM | POA: Diagnosis not present

## 2018-01-07 DIAGNOSIS — Z881 Allergy status to other antibiotic agents status: Secondary | ICD-10-CM | POA: Diagnosis not present

## 2018-01-07 DIAGNOSIS — M545 Low back pain, unspecified: Secondary | ICD-10-CM

## 2018-01-07 DIAGNOSIS — N179 Acute kidney failure, unspecified: Secondary | ICD-10-CM | POA: Diagnosis not present

## 2018-01-07 DIAGNOSIS — Z79899 Other long term (current) drug therapy: Secondary | ICD-10-CM | POA: Diagnosis not present

## 2018-01-07 DIAGNOSIS — Z7982 Long term (current) use of aspirin: Secondary | ICD-10-CM | POA: Diagnosis not present

## 2018-01-07 DIAGNOSIS — I708 Atherosclerosis of other arteries: Secondary | ICD-10-CM | POA: Diagnosis not present

## 2018-01-07 DIAGNOSIS — M5136 Other intervertebral disc degeneration, lumbar region: Secondary | ICD-10-CM | POA: Insufficient documentation

## 2018-01-07 DIAGNOSIS — F209 Schizophrenia, unspecified: Secondary | ICD-10-CM | POA: Diagnosis not present

## 2018-01-07 DIAGNOSIS — J45909 Unspecified asthma, uncomplicated: Secondary | ICD-10-CM | POA: Insufficient documentation

## 2018-01-07 DIAGNOSIS — F1411 Cocaine abuse, in remission: Secondary | ICD-10-CM | POA: Diagnosis not present

## 2018-01-07 DIAGNOSIS — N3000 Acute cystitis without hematuria: Secondary | ICD-10-CM | POA: Insufficient documentation

## 2018-01-07 DIAGNOSIS — E860 Dehydration: Secondary | ICD-10-CM | POA: Insufficient documentation

## 2018-01-07 DIAGNOSIS — M549 Dorsalgia, unspecified: Secondary | ICD-10-CM

## 2018-01-07 DIAGNOSIS — Z8673 Personal history of transient ischemic attack (TIA), and cerebral infarction without residual deficits: Secondary | ICD-10-CM | POA: Insufficient documentation

## 2018-01-07 DIAGNOSIS — F17210 Nicotine dependence, cigarettes, uncomplicated: Secondary | ICD-10-CM | POA: Diagnosis not present

## 2018-01-07 DIAGNOSIS — Z791 Long term (current) use of non-steroidal anti-inflammatories (NSAID): Secondary | ICD-10-CM | POA: Insufficient documentation

## 2018-01-07 DIAGNOSIS — R2681 Unsteadiness on feet: Secondary | ICD-10-CM | POA: Diagnosis not present

## 2018-01-07 DIAGNOSIS — F319 Bipolar disorder, unspecified: Secondary | ICD-10-CM | POA: Diagnosis not present

## 2018-01-07 DIAGNOSIS — D649 Anemia, unspecified: Secondary | ICD-10-CM | POA: Diagnosis not present

## 2018-01-07 HISTORY — DX: Acute kidney failure, unspecified: N17.9

## 2018-01-07 LAB — URINALYSIS, COMPLETE (UACMP) WITH MICROSCOPIC
BILIRUBIN URINE: NEGATIVE
Bacteria, UA: NONE SEEN
Glucose, UA: 50 mg/dL — AB
KETONES UR: NEGATIVE mg/dL
LEUKOCYTES UA: NEGATIVE
Nitrite: NEGATIVE
Protein, ur: 100 mg/dL — AB
SPECIFIC GRAVITY, URINE: 1.012 (ref 1.005–1.030)
pH: 5 (ref 5.0–8.0)

## 2018-01-07 LAB — COMPREHENSIVE METABOLIC PANEL
ALBUMIN: 4 g/dL (ref 3.5–5.0)
ALT: 29 U/L (ref 0–44)
ANION GAP: 16 — AB (ref 5–15)
AST: 39 U/L (ref 15–41)
Alkaline Phosphatase: 62 U/L (ref 38–126)
BUN: 20 mg/dL (ref 6–20)
CALCIUM: 8.7 mg/dL — AB (ref 8.9–10.3)
CO2: 20 mmol/L — AB (ref 22–32)
Chloride: 99 mmol/L (ref 98–111)
Creatinine, Ser: 1.66 mg/dL — ABNORMAL HIGH (ref 0.44–1.00)
GFR calc Af Amer: 40 mL/min — ABNORMAL LOW (ref >60)
GFR calc non Af Amer: 35 mL/min — ABNORMAL LOW (ref >60)
GLUCOSE: 109 mg/dL — AB (ref 70–99)
Potassium: 3.8 mmol/L (ref 3.5–5.1)
Sodium: 135 mmol/L (ref 135–145)
Total Bilirubin: 0.3 mg/dL (ref 0.3–1.2)
Total Protein: 8 g/dL (ref 6.5–8.1)

## 2018-01-07 LAB — CBC
HCT: 41.6 % (ref 36.0–46.0)
Hemoglobin: 14.5 g/dL (ref 12.0–15.0)
MCH: 32.1 pg (ref 26.0–34.0)
MCHC: 34.9 g/dL (ref 30.0–36.0)
MCV: 92 fL (ref 80.0–100.0)
NRBC: 0 % (ref 0.0–0.2)
Platelets: 101 10*3/uL — ABNORMAL LOW (ref 150–400)
RBC: 4.52 MIL/uL (ref 3.87–5.11)
RDW: 13.1 % (ref 11.5–15.5)
WBC: 16.6 10*3/uL — ABNORMAL HIGH (ref 4.0–10.5)

## 2018-01-07 LAB — URINE DRUG SCREEN, QUALITATIVE (ARMC ONLY)
AMPHETAMINES, UR SCREEN: NOT DETECTED
BENZODIAZEPINE, UR SCRN: NOT DETECTED
Barbiturates, Ur Screen: NOT DETECTED
Cannabinoid 50 Ng, Ur ~~LOC~~: NOT DETECTED
Cocaine Metabolite,Ur ~~LOC~~: NOT DETECTED
MDMA (Ecstasy)Ur Screen: NOT DETECTED
METHADONE SCREEN, URINE: NOT DETECTED
Opiate, Ur Screen: NOT DETECTED
Phencyclidine (PCP) Ur S: NOT DETECTED
Tricyclic, Ur Screen: NOT DETECTED

## 2018-01-07 LAB — ETHANOL

## 2018-01-07 MED ORDER — ACETAMINOPHEN 500 MG PO TABS
1000.0000 mg | ORAL_TABLET | Freq: Once | ORAL | Status: AC
  Administered 2018-01-07: 1000 mg via ORAL
  Filled 2018-01-07: qty 2

## 2018-01-07 MED ORDER — FENTANYL CITRATE (PF) 100 MCG/2ML IJ SOLN
50.0000 ug | Freq: Once | INTRAMUSCULAR | Status: AC
  Administered 2018-01-07: 50 ug via INTRAVENOUS
  Filled 2018-01-07: qty 2

## 2018-01-07 MED ORDER — ALBUTEROL SULFATE (2.5 MG/3ML) 0.083% IN NEBU: 2.5000 mg | INHALATION_SOLUTION | RESPIRATORY_TRACT | Status: DC | PRN

## 2018-01-07 MED ORDER — PHENYTOIN SODIUM EXTENDED 100 MG PO CAPS
300.0000 mg | ORAL_CAPSULE | Freq: Every day | ORAL | Status: DC
  Administered 2018-01-08: 300 mg via ORAL
  Filled 2018-01-07 (×3): qty 3

## 2018-01-07 MED ORDER — DIVALPROEX SODIUM 500 MG PO DR TAB
1000.0000 mg | DELAYED_RELEASE_TABLET | Freq: Every evening | ORAL | Status: DC
  Administered 2018-01-08: 1000 mg via ORAL
  Filled 2018-01-07 (×3): qty 2

## 2018-01-07 MED ORDER — PHENYTOIN SODIUM EXTENDED 100 MG PO CAPS
300.0000 mg | ORAL_CAPSULE | Freq: Once | ORAL | Status: AC
  Administered 2018-01-07: 300 mg via ORAL
  Filled 2018-01-07: qty 3

## 2018-01-07 MED ORDER — TRAZODONE HCL 50 MG PO TABS: 75.0000 mg | ORAL_TABLET | Freq: Every day | ORAL | Status: DC

## 2018-01-07 MED ORDER — CEFTRIAXONE SODIUM 1 G IJ SOLR
1.0000 g | Freq: Once | INTRAMUSCULAR | Status: AC
  Administered 2018-01-07: 1 g via INTRAVENOUS
  Filled 2018-01-07: qty 10

## 2018-01-07 MED ORDER — SODIUM CHLORIDE 0.9 % IV SOLN
INTRAVENOUS | Status: DC
  Administered 2018-01-08 (×3): via INTRAVENOUS

## 2018-01-07 MED ORDER — ACETAMINOPHEN 325 MG PO TABS
650.0000 mg | ORAL_TABLET | Freq: Four times a day (QID) | ORAL | Status: DC | PRN
  Administered 2018-01-08: 650 mg via ORAL
  Filled 2018-01-07 (×2): qty 2

## 2018-01-07 MED ORDER — ONDANSETRON HCL 4 MG/2ML IJ SOLN: 4.0000 mg | Freq: Four times a day (QID) | INTRAMUSCULAR | Status: DC | PRN

## 2018-01-07 MED ORDER — ASPIRIN EC 81 MG PO TBEC
81.0000 mg | DELAYED_RELEASE_TABLET | Freq: Every day | ORAL | Status: DC
  Administered 2018-01-08 – 2018-01-09 (×2): 81 mg via ORAL
  Filled 2018-01-07 (×2): qty 1

## 2018-01-07 MED ORDER — DIVALPROEX SODIUM 500 MG PO DR TAB: 500.0000 mg | DELAYED_RELEASE_TABLET | Freq: Two times a day (BID) | ORAL | Status: DC

## 2018-01-07 MED ORDER — ACETAMINOPHEN 650 MG RE SUPP: 650.0000 mg | Freq: Four times a day (QID) | RECTAL | Status: DC | PRN

## 2018-01-07 MED ORDER — TRAMADOL HCL 50 MG PO TABS
50.0000 mg | ORAL_TABLET | Freq: Two times a day (BID) | ORAL | Status: DC | PRN
  Administered 2018-01-07 – 2018-01-08 (×3): 50 mg via ORAL
  Filled 2018-01-07 (×5): qty 1

## 2018-01-07 MED ORDER — SODIUM CHLORIDE 0.9 % IV SOLN
1.0000 g | INTRAVENOUS | Status: DC
  Administered 2018-01-08: 1 g via INTRAVENOUS
  Filled 2018-01-07: qty 1
  Filled 2018-01-07: qty 10

## 2018-01-07 MED ORDER — ONDANSETRON HCL 4 MG PO TABS: 4.0000 mg | ORAL_TABLET | Freq: Four times a day (QID) | ORAL | Status: DC | PRN

## 2018-01-07 MED ORDER — HEPARIN SODIUM (PORCINE) 5000 UNIT/ML IJ SOLN
5000.0000 [IU] | Freq: Three times a day (TID) | INTRAMUSCULAR | Status: DC
  Administered 2018-01-07 – 2018-01-09 (×5): 5000 [IU] via SUBCUTANEOUS
  Filled 2018-01-07 (×5): qty 1

## 2018-01-07 MED ORDER — SODIUM CHLORIDE 0.9 % IV BOLUS
1000.0000 mL | Freq: Once | INTRAVENOUS | Status: AC
  Administered 2018-01-07: 1000 mL via INTRAVENOUS

## 2018-01-07 MED ORDER — DIVALPROEX SODIUM 500 MG PO DR TAB
500.0000 mg | DELAYED_RELEASE_TABLET | Freq: Every morning | ORAL | Status: DC
  Administered 2018-01-08 – 2018-01-09 (×2): 500 mg via ORAL
  Filled 2018-01-07 (×2): qty 1

## 2018-01-07 MED ORDER — QUETIAPINE FUMARATE 25 MG PO TABS
100.0000 mg | ORAL_TABLET | Freq: Every day | ORAL | Status: DC
  Administered 2018-01-07 – 2018-01-08 (×2): 100 mg via ORAL
  Filled 2018-01-07 (×2): qty 4

## 2018-01-07 MED ORDER — ONDANSETRON HCL 4 MG/2ML IJ SOLN
4.0000 mg | Freq: Once | INTRAMUSCULAR | Status: AC
  Administered 2018-01-07: 4 mg via INTRAVENOUS
  Filled 2018-01-07: qty 2

## 2018-01-07 NOTE — ED Notes (Signed)
Pt notified urine sample needed. 

## 2018-01-07 NOTE — ED Triage Notes (Signed)
Pt brought in via ems from home.  Pt reports low back pain for 3 days.  Today neck is hurting per pt.  No known injury.  Denies urinary sx.  Pt alert  Speech clear.

## 2018-01-07 NOTE — ED Notes (Signed)
Discussed with Dr. Don PerkingVeronese pt's c/o pain and frequently asking to lay down and moaning in lobby.  New order received for 1gram of Tylenol.

## 2018-01-07 NOTE — ED Provider Notes (Addendum)
[facility] Emergency Department Provider Note  ____________________________________________   I have reviewed the triage vital signs and the nursing notes. Where available I have reviewed prior notes and, if possible and indicated, outside hospital notes.    HISTORY  Chief Complaint Back Pain    HPI [name] is a 52 y.o. female with a history of anemia, asthma bipolar cocaine use, no IV drug use she states, depression, schizoaffective disorder, chronic somatic back pain "for years" seizure disorder on Dilantin states that she has been having low back pain now for years but is been worse over the last several days.  Is on the left side mostly left flank pain.  No fever, no dysuria but positive vomiting today.  Pain is been there for 3 days.  No numbness no weakness no incontinence of bowel or bladder.  No fall.  It hurts "all the time".  She states she has not been able to take any meds today cleaning her Dilantin because she was vomiting earlier.  She is vomited 3 times.  Nonbloody nonbilious. Pain is a sharp nonradiating discomfort  Past Medical History:  Diagnosis Date  . Allergy   . Anemia   . Arthritis   . Asthma    NO INHALERS  . Bipolar disorder (HCC)   . Cocaine abuse in remission (HCC)   . Depression   . Dyspnea   . Eczema   . Nervous   . Schizophrenia (HCC)   . Scoliosis   . Seizure (HCC)   . Seizures (HCC)    Last seizure, May 20, 2016-PT WAS ALONE-UNSURE LENGTH OF SEIZURE-SCRAPED NOSE AND LIP  . Stroke [facility]) 2007  . Tobacco use     Patient Active Problem List   Diagnosis Date Noted  . Seizure (HCC) 08/14/2017  . Somatic dysfunction of spine, lumbar 12/11/2016  . Somatic dysfunction of spine, thoracic 12/11/2016  . Low back pain 11/27/2016  . Spasm of muscle of lower back 11/27/2016  . Mass involving both sides of chest wall   . Cyst of skin of breast 03/18/2016  . Right wrist pain 03/15/2016  . Eczema 05/16/2015  .  Cocaine abuse in remission (HCC) 08/22/2014  . Seizure disorder (HCC) 08/21/2014    Past Surgical History:  Procedure Laterality Date  . APPENDECTOMY  2000  . BACK SURGERY  2012  . BREAST CYST EXCISION Bilateral 05/29/2016   Procedure: MASS EXCISION CHEST WALL;  Surgeon: Ricarda Frame, MD;  Location: [facility] ORS;  Service: General;  Laterality: Bilateral;  . DILATION AND CURETTAGE OF UTERUS      Prior to Admission medications   Medication Sig Start Date End Date Taking? Authorizing Provider  atorvastatin (LIPITOR) 40 MG tablet Take 1 tablet (40 mg total) by mouth daily at 6 PM. 08/15/17   Enid Baas, MD  divalproex (DEPAKOTE) 500 MG DR tablet Take 1-2 tablets (500-1,000 mg total) by mouth 2 (two) times daily. 08/15/17 09/14/17  Enid Baas, MD  phenytoin (DILANTIN) 100 MG ER capsule Take 3 capsules (300 mg total) by mouth at bedtime. 08/15/17 09/14/17  Enid Baas, MD  traZODone (DESYREL) 150 MG tablet Take 75 mg by mouth daily. 07/25/17   [provider]  zolpidem (AMBIEN) 5 MG tablet Take 1 tablet (5 mg total) by mouth daily. 08/15/17   Enid Baas, MD    Allergies Flagyl [metronidazole]  Family History  Problem Relation Age of Onset  . Epilepsy Mother   . Seizures Mother   . Heart disease  Father   . Epilepsy Maternal Grandmother   . Heart attack Maternal Grandmother     Social History Social History   Tobacco Use  . Smoking status: Current Every Day Smoker    Packs/day: 0.75    Years: 47.00    Pack years: 35.25    Types: Cigarettes  . Smokeless tobacco: Never Used  . Tobacco comment: Interested in resources   Substance Use Topics  . Alcohol use: Not Currently    Alcohol/week: 49.0 standard drinks    Types: 49 Cans of beer per week  . Drug use: No    Types: Cocaine    Review of Systems Constitutional: No fever/chills Eyes: No visual changes. ENT: No sore throat. No stiff neck no neck pain Cardiovascular: Denies chest  pain. Respiratory: Denies shortness of breath. Gastrointestinal:   + vomiting.  No diarrhea.  No constipation. Genitourinary: Negative for dysuria. Musculoskeletal: Negative lower extremity swelling Skin: Negative for rash. Neurological: Negative for severe headaches, focal weakness or numbness.   ____________________________________________   PHYSICAL EXAM:  VITAL SIGNS: ED Triage Vitals  Enc Vitals Group     BP 01/07/18 1613 110/67     Pulse Rate 01/07/18 1613 (!) 110     Resp 01/07/18 1613 20     Temp 01/07/18 1613 100.1 F (37.8 C)     Temp Source 01/07/18 1613 Oral     SpO2 01/07/18 1613 98 %     Weight 01/07/18 1615 106 lb (48.1 kg)     Height 01/07/18 1615 5' (1.524 m)     Head Circumference --      Peak Flow --      Pain Score 01/07/18 1615 8     Pain Loc --      Pain Edu? --      Excl. in GC? --     Constitutional: Alert and oriented.  Anxious and upset but nontoxic does not appear to be significantly discomfort Eyes: Conjunctivae are normal Head: Atraumatic HEENT: No congestion/rhinnorhea. Mucous membranes are moist.  Oropharynx non-erythematous Neck:   Nontender with no meningismus, no masses, no stridor Cardiovascular: Normal rate, regular rhythm. Grossly normal heart sounds.  Good peripheral circulation. Respiratory: Normal respiratory effort.  No retractions. Lungs CTAB. Abdominal: Soft and nontender. No distention. No guarding no rebound Back: Tenderness palpation in the left paraspinal region going towards left flank.  there is no midline tenderness there are no lesions noted. there is no CVA tenderness Musculoskeletal: No lower extremity tenderness, no upper extremity tenderness. No joint effusions, no DVT signs strong distal pulses no edema Neurologic:  Normal speech and language. No gross focal neurologic deficits are appreciated.  Skin:  Skin is warm, dry and intact. No rash noted. Psychiatric: Mood and affect are normal. Speech and behavior are  normal.  ____________________________________________   LABS (all labs ordered are listed, but only abnormal results are displayed)  Labs Reviewed  COMPREHENSIVE METABOLIC PANEL - Abnormal; Notable for the following components:      Result Value   CO2 20 (*)    Glucose, Bld 109 (*)    Creatinine, Ser 1.66 (*)    Calcium 8.7 (*)    GFR calc non Af Amer 35 (*)    GFR calc Af Amer 40 (*)    Anion gap 16 (*)    All other components within normal limits  URINALYSIS, COMPLETE (UACMP) WITH MICROSCOPIC - Abnormal; Notable for the following components:   Color, Urine YELLOW (*)    APPearance HAZY (*)  Glucose, UA 50 (*)    Hgb urine dipstick SMALL (*)    Protein, ur 100 (*)    All other components within normal limits  CBC - Abnormal; Notable for the following components:   WBC 16.6 (*)    Platelets 101 (*)    All other components within normal limits  URINE DRUG SCREEN, QUALITATIVE ([facility] ONLY)    Pertinent labs  results that were available during my care of the patient were reviewed by me and considered in my medical decision making (see chart for details). ____________________________________________  EKG  I personally interpreted any EKGs ordered by me or triage  ____________________________________________  RADIOLOGY  Pertinent labs & imaging results that were available during my care of the patient were reviewed by me and considered in my medical decision making (see chart for details). If possible, patient and/or family made aware of any abnormal findings.  No results found. ____________________________________________    PROCEDURES  Procedure(s) performed: None  Procedures  Critical Care performed: None  ____________________________________________   INITIAL IMPRESSION / ASSESSMENT AND PLAN / ED COURSE  Pertinent labs & imaging results that were available during my care of the patient were reviewed by me and considered in my medical decision making (see  chart for details).  Patient here with a history of chronic recurrent back pain according the family is in all the time issue, but seems worse department last couple days.  She is not had much to eat or drink.  Last time she was cocaine positive.  She denies recently using we will check a urine drug screen, urinalysis does show some blood in her urine, she does not have a known history of kidney stones, no evidence of urinary tract infection definitively on her urine but we will send urine culture.  She does have some abnormalities in her labs she does appear to be dehydrated with an elevated white count, is not clear if this is because she has not had much p.o. recently, patient is a poor stream and states she just has not felt like eating recently.  Nothing at this time to suggest cauda equina syndrome, she does not appear to be acutely compromised neurologically, low suspicion for paraspinal abscess or hematoma.  We will however obtain CT scan for further evaluation of her back pain and reassess  ----------------------------------------- 8:12 PM on 01/07/2018 -----------------------------------------  Patient is receiving pain meds at this time, CT scan is reassuring neurologically she is in no acute distress her abdomen is benign there is no evidence of a referred PE, or intrathoracic pathology, but she has a white count of nearly 17, her kidney function has declined significantly, and she had a low-grade fever with tachycardia upon arrival.  Most likely, with leg pain especially flank pain sparing the midline, this is pyelonephritis although her urine is unconvincing in that regard except for clumps of white cells and 21-50 whites.  I also noticed that her platelet count is slightly low.  Her kidney function likely is because she is not been eating or drinking much, urine drug screen and urine culture are pending I am giving her IV antibiotics and pain medication for presumed pyelonephritis although  again reassuring CT.  Patient has chronic pain and is a very poor historian which makes it difficult for [place] to tease out the exact elements of the history but at this time the no does not appear to be any evidence of spinal abscess, her abdomen on serial exams is benign and there  is nothing to suggest PID from this flank pain she has no vaginal discharge and she and family again deny IVDA.     ____________________________________________   FINAL CLINICAL IMPRESSION(S) / ED DIAGNOSES  Final diagnoses:  None      This chart was dictated using voice recognition software.  Despite best efforts to proofread,  errors can occur which can change meaning.      Jeanmarie Plant, MD 01/07/18 1930    Jeanmarie Plant, MD 01/07/18 2018

## 2018-01-07 NOTE — ED Notes (Signed)
Georgie RN, aware of bed assigned  

## 2018-01-07 NOTE — H&P (Signed)
[employer] - [place] at [facility]    PATIENT NAME: [name]    MR#:  [id number]  DATE OF BIRTH:  September 03, 1965  DATE OF ADMISSION:  01/07/2018  PRIMARY CARE PHYSICIAN: System, Provider Not In   REQUESTING/REFERRING PHYSICIAN: Dr. Ileana Roup  CHIEF COMPLAINT:   Chief Complaint  Patient presents with  . Back Pain    HISTORY OF PRESENT ILLNESS:  [name]  is a 52 y.o. female with a known history of bipolar disorder, schizophrenia, history of substance abuse, seizures, asthma, previous history of CVA, ongoing tobacco abuse who presents to the hospital due to back pain.  Patient is a very poor historian but says she developed back pain/left lower quadrant abdominal pain about 1 to 2 days ago.  She also has developed some nausea and vomiting associated with this and has not been able to keep any of her pills down.  She denies having any seizures even though she has a seizure history.  She presents to the ER his symptoms are not improving and was noted to be in acute kidney injury and noted to have an abnormal urinalysis possibly consistent with a UTI.  Hospitalist services were contacted for admission.  Patient says that due to her back/abdominal pain she is been taking increasing amounts of ibuprofen over the past couple days at least 7-8 times daily.  She denies any fevers chills, chest pains, shortness of breath, hematuria, dysuria, melena, hematochezia or any other associated symptoms presently.  PAST MEDICAL HISTORY:   Past Medical History:  Diagnosis Date  . Acute renal failure (ARF) (HCC) 01/07/2018  . Allergy   . Anemia   . Arthritis   . Asthma    NO INHALERS  . Bipolar disorder (HCC)   . Cocaine abuse in remission (HCC)   . Depression   . Dyspnea   . Eczema   . Nervous   . Schizophrenia (HCC)   . Scoliosis   . Seizure (HCC)   . Seizures (HCC)    Last seizure, May 20, 2016-PT WAS ALONE-UNSURE LENGTH OF SEIZURE-SCRAPED NOSE AND LIP  . Stroke  [facility]) 2007  . Tobacco use     PAST SURGICAL HISTORY:   Past Surgical History:  Procedure Laterality Date  . APPENDECTOMY  2000  . BACK SURGERY  2012  . BREAST CYST EXCISION Bilateral 05/29/2016   Procedure: MASS EXCISION CHEST WALL;  Surgeon: Ricarda Frame, MD;  Location: [facility] ORS;  Service: General;  Laterality: Bilateral;  . DILATION AND CURETTAGE OF UTERUS      SOCIAL HISTORY:   Social History   Tobacco Use  . Smoking status: Current Every Day Smoker    Packs/day: 0.75    Years: 47.00    Pack years: 35.25    Types: Cigarettes  . Smokeless tobacco: Never Used  . Tobacco comment: Interested in resources   Substance Use Topics  . Alcohol use: Not Currently    Alcohol/week: 49.0 standard drinks    Types: 49 Cans of beer per week    FAMILY HISTORY:   Family History  Problem Relation Age of Onset  . Epilepsy Mother   . Seizures Mother   . Heart disease Father   . Epilepsy Maternal Grandmother   . Heart attack Maternal Grandmother     DRUG ALLERGIES:   Allergies  Allergen Reactions  . Flagyl [Metronidazole] Other (See Comments)    "my eyes go back in the back of my head"    REVIEW OF SYSTEMS:  Review of Systems  Constitutional: Negative for fever and weight loss.  HENT: Negative for congestion, nosebleeds and tinnitus.   Eyes: Negative for blurred vision, double vision and redness.  Respiratory: Negative for cough, hemoptysis and shortness of breath.   Cardiovascular: Negative for chest pain, orthopnea, leg swelling and PND.  Gastrointestinal: Positive for abdominal pain. Negative for diarrhea, melena, nausea and vomiting.  Genitourinary: Negative for dysuria, hematuria and urgency.  Musculoskeletal: Positive for back pain. Negative for falls and joint pain.  Neurological: Negative for dizziness, tingling, sensory change, focal weakness, seizures, weakness and headaches.  Endo/Heme/Allergies: Negative for polydipsia. Does not bruise/bleed easily.    Psychiatric/Behavioral: Negative for depression and memory loss. The patient is not nervous/anxious.     MEDICATIONS AT HOME:   Prior to Admission medications   Medication Sig Start Date End Date Taking? Authorizing Provider  aspirin EC 81 MG tablet Take 1 tablet by mouth daily. 10/15/17 10/15/18 Yes [provider]  divalproex (DEPAKOTE) 500 MG DR tablet Take 1-2 tablets (500-1,000 mg total) by mouth 2 (two) times daily. Patient taking differently: Take 500-1,000 mg by mouth 2 (two) times daily.  08/15/17 01/07/18 Yes Enid Baas, MD  ibuprofen (ADVIL,MOTRIN) 600 MG tablet Take 1 tablet by mouth daily as needed. 01/06/18  Yes [provider]  meloxicam (MOBIC) 7.5 MG tablet Take 1 tablet by mouth 2 (two) times daily. 12/12/17  Yes [provider]  phenytoin (DILANTIN) 100 MG ER capsule Take 3 capsules (300 mg total) by mouth at bedtime. 08/15/17 01/07/18 Yes Enid Baas, MD  QUEtiapine (SEROQUEL) 100 MG tablet Take 100 mg by mouth at bedtime.   Yes [provider]  triamcinolone (KENALOG) 0.025 % cream Apply 1 application topically 4 (four) times daily as needed. 12/31/17  Yes [provider]  VENTOLIN HFA 108 (90 Base) MCG/ACT inhaler Inhale 2 puffs into the lungs as needed. 12/29/17  Yes [provider]  atorvastatin (LIPITOR) 40 MG tablet Take 1 tablet (40 mg total) by mouth daily at 6 PM. Patient not taking: Reported on 01/07/2018 08/15/17   Enid Baas, MD  traZODone (DESYREL) 150 MG tablet Take 75 mg by mouth daily. 07/25/17   [provider]  zolpidem (AMBIEN) 5 MG tablet Take 1 tablet (5 mg total) by mouth daily. Patient not taking: Reported on 01/07/2018 08/15/17   Enid Baas, MD      VITAL SIGNS:  Blood pressure 114/90, pulse (!) 110, temperature 98.6 F (37 C), temperature source Oral, resp. rate 20, height 5' (1.524 m), weight 48.1 kg, SpO2 97 %.  PHYSICAL EXAMINATION:  Physical Exam  GENERAL:   52 y.o.-year-old patient lying in the bed in no acute distress.  EYES: Pupils equal, round, reactive to light and accommodation. No scleral icterus. Extraocular muscles intact.  HEENT: Head atraumatic, normocephalic. Oropharynx and nasopharynx clear. No oropharyngeal erythema, moist oral mucosa  NECK:  Supple, no jugular venous distention. No thyroid enlargement, no tenderness.  LUNGS: Normal breath sounds bilaterally, no wheezing, rales, rhonchi. No use of accessory muscles of respiration.  CARDIOVASCULAR: S1, S2 RRR. No murmurs, rubs, gallops, clicks.  ABDOMEN: Soft, nontender, nondistended. Bowel sounds present. No organomegaly or mass.  EXTREMITIES: No pedal edema, cyanosis, or clubbing. + 2 pedal & radial pulses b/l.   NEUROLOGIC: Cranial nerves II through XII are intact. No focal Motor or sensory deficits appreciated b/l PSYCHIATRIC: The patient is alert and oriented x 3.  SKIN: No obvious rash, lesion, or ulcer.   LABORATORY PANEL:  CBC Recent Labs  Lab 01/07/18 1707  WBC 16.6*  HGB 14.5  HCT 41.6  PLT 101*   ------------------------------------------------------------------------------------------------------------------  Chemistries  Recent Labs  Lab 01/07/18 1619  NA 135  K 3.8  CL 99  CO2 20*  GLUCOSE 109*  BUN 20  CREATININE 1.66*  CALCIUM 8.7*  AST 39  ALT 29  ALKPHOS 62  BILITOT 0.3   ------------------------------------------------------------------------------------------------------------------  Cardiac Enzymes No results for input(s): TROPONINI in the last 168 hours. ------------------------------------------------------------------------------------------------------------------  RADIOLOGY:  Ct Renal Stone Study  Result Date: 01/07/2018 CLINICAL DATA:  Low back pain for 3 days. EXAM: CT ABDOMEN AND PELVIS WITHOUT CONTRAST TECHNIQUE: Multidetector CT imaging of the abdomen and pelvis was performed following the standard protocol without IV  contrast. COMPARISON:  11/27/2016 lumbar spine radiographs FINDINGS: Lower chest: Top normal heart size without pericardial effusion. Atelectasis and/or scarring at the lung bases. No effusion or pneumothorax. Hepatobiliary: No focal liver abnormality is seen. No gallstones, gallbladder wall thickening, or biliary dilatation. Probable biliary sludge along the dependent aspect of the gallbladder counting for differential attenuation seen within. Pancreas: Normal Spleen: Normal Adrenals/Urinary Tract: Normal bilateral adrenal glands. Low attenuating lesion in the lower pole the right kidney likely to represent a small cyst. This measures 8 mm. No nephrolithiasis nor hydroureteronephrosis. Stomach/Bowel: Stomach is within normal limits. Status post appendectomy. No evidence of bowel wall thickening, distention, or inflammatory changes. Vascular/Lymphatic: Scattered aortoiliac atherosclerosis without aneurysm. Phleboliths are noted within pelvis. No lymphadenopathy. Reproductive: Uterus and bilateral adnexa are unremarkable. Other: No free air or free fluid. Musculoskeletal: Levorotatory scoliosis of the lumbar spine with facet arthrosis. Degenerative disc disease L3-4 with discogenic sclerosis and disc flattening. Mild multilevel facet arthropathy lumbar spine greatest from L3 through S1. IMPRESSION: 1. Levorotatory scoliosis of the lumbar spine with degenerative disc disease, greatest at L3-4. Multilevel degenerative facet arthropathy. No acute osseous appearing abnormality. 2. Probable biliary sludge in the gallbladder without secondary signs of cholecystitis. 3. 8 mm hypodensity in the right kidney statistically consistent with a cyst. No nephrolithiasis nor obstructive uropathy. Electronically Signed   By: Tollie Ethavid  Kwon M.D.   On: 01/07/2018 19:55     IMPRESSION AND PLAN:   52 year old female with past medical history of schizophrenia, seizures, bipolar disorder, asthma, scoliosis, tobacco abuse who presents to  the hospital due to back pain.  1.  Acute kidney injury-secondary to nausea vomiting and increasing use of NSAIDs over the past few days due to her back pain. - We will hydrate the patient with IV fluids, follow BUN and creatinine urine output. -Hold any further NSAIDs for pain.  2.  UTI-patient's urinalysis is not completely positive for UTI but it could be atypical presentation given her acute kidney injury and worsening symptoms.  Her CT abdomen pelvis is not suggestive of any cystitis or pyelonephritis. - We will empirically treat her with IV ceftriaxone, follow urine cultures.  3.  Leukocytosis- possibly secondary to the UTI and also an underlying stress mediated. - We will follow with IV antibiotic therapy.  4.  Back pain- very atypical, patient does have a history of scoliosis.  I will get a x-ray of her lumbar spine to start with.  She does have significant degenerative disc disease based on a CT abdomen pelvis as mentioned. -Avoid NSAIDs, will order some tramadol as needed for pain.  5.  History of seizures- no acute seizure type activity. -Continue Depakote, Dilantin.  6.  History of asthma-continue albuterol inhaler as needed.  7. Hx of  Schizophrenia/Bipolar Disorder - cont. Depakote, Seroquel.      All the records are reviewed and case discussed with ED provider. Management plans discussed with the patient, family and they are in agreement.  CODE STATUS: full code  TOTAL TIME TAKING CARE OF THIS PATIENT: 40 minutes.    Houston Siren M.D on 01/07/2018 at 8:58 PM  Between 7am to 6pm - Pager - [phone]  After 6pm go to [email or web address] - password [email or web address]  [facility]  Office  [phone]  CC: Primary care physician; System, Provider Not In

## 2018-01-07 NOTE — ED Notes (Signed)
Pt on bedpan.

## 2018-01-07 NOTE — ED Notes (Signed)
Blood redrawn d/t first set of blood drawn not being labeled.

## 2018-01-08 ENCOUNTER — Other Ambulatory Visit: Payer: Self-pay

## 2018-01-08 DIAGNOSIS — N179 Acute kidney failure, unspecified: Secondary | ICD-10-CM | POA: Diagnosis not present

## 2018-01-08 LAB — BASIC METABOLIC PANEL
Anion gap: 9 (ref 5–15)
BUN: 20 mg/dL (ref 6–20)
CO2: 21 mmol/L — ABNORMAL LOW (ref 22–32)
Calcium: 7.3 mg/dL — ABNORMAL LOW (ref 8.9–10.3)
Chloride: 107 mmol/L (ref 98–111)
Creatinine, Ser: 1.41 mg/dL — ABNORMAL HIGH (ref 0.44–1.00)
GFR calc Af Amer: 49 mL/min — ABNORMAL LOW (ref >60)
GFR calc non Af Amer: 42 mL/min — ABNORMAL LOW (ref >60)
Glucose, Bld: 114 mg/dL — ABNORMAL HIGH (ref 70–99)
Potassium: 3.2 mmol/L — ABNORMAL LOW (ref 3.5–5.1)
Sodium: 137 mmol/L (ref 135–145)

## 2018-01-08 LAB — CBC
HCT: 36.3 % (ref 36.0–46.0)
Hemoglobin: 12.6 g/dL (ref 12.0–15.0)
MCH: 32 pg (ref 26.0–34.0)
MCHC: 34.7 g/dL (ref 30.0–36.0)
MCV: 92.1 fL (ref 80.0–100.0)
Platelets: 96 10*3/uL — ABNORMAL LOW (ref 150–400)
RBC: 3.94 MIL/uL (ref 3.87–5.11)
RDW: 13.1 % (ref 11.5–15.5)
WBC: 10.8 10*3/uL — ABNORMAL HIGH (ref 4.0–10.5)
nRBC: 0 % (ref 0.0–0.2)

## 2018-01-08 MED ORDER — CYCLOBENZAPRINE HCL 5 MG PO TABS
7.5000 mg | ORAL_TABLET | Freq: Three times a day (TID) | ORAL | Status: DC | PRN
  Administered 2018-01-08 (×2): 7.5 mg via ORAL
  Filled 2018-01-08 (×4): qty 1.5

## 2018-01-08 MED ORDER — NICOTINE 21 MG/24HR TD PT24
21.0000 mg | MEDICATED_PATCH | Freq: Every day | TRANSDERMAL | Status: DC
  Administered 2018-01-08: 21 mg via TRANSDERMAL
  Filled 2018-01-08: qty 1

## 2018-01-08 MED ORDER — CALCIUM GLUCONATE-NACL 1-0.675 GM/50ML-% IV SOLN
1.0000 g | Freq: Once | INTRAVENOUS | Status: AC
  Administered 2018-01-08: 1000 mg via INTRAVENOUS
  Filled 2018-01-08: qty 50

## 2018-01-08 MED ORDER — HYDROMORPHONE HCL 1 MG/ML IJ SOLN
0.5000 mg | INTRAMUSCULAR | Status: DC | PRN
  Administered 2018-01-08 – 2018-01-09 (×5): 0.5 mg via INTRAVENOUS
  Filled 2018-01-08 (×5): qty 1

## 2018-01-08 MED ORDER — POTASSIUM CHLORIDE CRYS ER 20 MEQ PO TBCR
20.0000 meq | EXTENDED_RELEASE_TABLET | ORAL | Status: AC
  Administered 2018-01-08 (×2): 20 meq via ORAL
  Filled 2018-01-08 (×2): qty 1

## 2018-01-08 NOTE — Care Management Obs Status (Signed)
MEDICARE OBSERVATION STATUS NOTIFICATION   Patient Details  Name: [name] MRN: [id number] Date of Birth: 10/18/1965   Medicare Observation Status Notification Given:  Yes    Collie Siadngela Kortlynn Poust, RN 01/08/2018, 12:15 PM

## 2018-01-08 NOTE — Care Management Note (Signed)
Case Management Note  Patient Details  Name: [name] MRN: [id number] Date of Birth: 1965-05-27  Subjective/Objective:                  RNCM met with patient to discuss discharge planning. She plans to return to home with her boyfriend charlie however he works.  They do not have transportation but she does access [facility] for MD appointments.  She has health insurance now to include Medicaid.  Her PCP is with [facility].  She was in significant pain in her upper and lower back- she states she has notified her RN. She has used [facility] in the past, taxi voucher, [facility] and Medication management.  Action/Plan:   CMS.gov list of home health care agencies 3-5 star rating provided to patient for review. RNCM will follow.   Expected Discharge Date:                  Expected Discharge Plan:     In-House Referral:     Discharge planning Services  CM Consult  Post Acute Care Choice:  Home Health Choice offered to:  Patient  DME Arranged:    DME Agency:     HH Arranged:    [place] Agency:     Status of Service:  In process, will continue to follow  If discussed at Long Length of Stay Meetings, dates discussed:    Additional Comments:  Marshell Garfinkel, RN 01/08/2018, 12:15 PM

## 2018-01-08 NOTE — Evaluation (Signed)
Physical Therapy Evaluation Patient Details Name: [name] MRN: [id number] DOB: 07/15/1965 Today's Date: 01/08/2018   History of Present Illness  Pt is a 52 y/o F with a h/o chronic back pain and scoliosis who presented with increased intensity of LBP for 3 days with no known injury.  Pt negative for reg flags at PT evaluation.  Pt admitted with acute kidney injury, possible UTI.  X-ray of lumbar spine showed no acute/traumatic lumbar spine pathology with h/o levoscoliosis and degenerative changes. Pt's PMH includes seizures, schizophrenia, stroke, cocaine abuse, bipolar disorder, back surgery.      Clinical Impression  Pt admitted with above diagnosis. Pt currently with functional limitations due to the deficits listed below (see PT Problem List). Ms. Tiburcio PeaHarris is independent with all aspects of mobility at baseline, not using an AD.  She presents with severe low back all the way up to her neck pain, significantly limiting her mobility.  She currently requires min assist for bed mobility, sit<>stand, and to ambulate 5 ft.  Pt was unable to ambulate a farther distance due to severe back pain.  Given pt's current mobility status, recommending SNF at d/c.   Pt will benefit from skilled PT to increase their independence and safety with mobility to allow discharge to the venue listed below.      Follow Up Recommendations SNF    Equipment Recommendations  Other (comment)(youth RW)    Recommendations for Other Services       Precautions / Restrictions Precautions Precautions: Fall;Other (comment) Precaution Comments: Taught pt log roll for decreased pain with bed mobility.  Instructed pt to avoid twisting activities Restrictions Weight Bearing Restrictions: No      Mobility  Bed Mobility Overal bed mobility: Needs Assistance Bed Mobility: Rolling;Sidelying to Sit Rolling: Min assist Sidelying to sit: Min assist;HOB elevated       General bed mobility comments: Pt does not fully  roll onto her side before bringing legs off side of bed despite repeated cues.  She requires assist to roll and to transition from sidelying to sitting  Transfers Overall transfer level: Needs assistance Equipment used: Rolling walker (2 wheeled) Transfers: Sit to/from Stand Sit to Stand: Min assist         General transfer comment: Cues for hand placement but pt keeps Bil hands on RW due to pain.  Min assist to remain steady throughout sit<>stand as pt unsteady due to pain.   Ambulation/Gait Ambulation/Gait assistance: Min assist Gait Distance (Feet): 5 Feet Assistive device: Rolling walker (2 wheeled) Gait Pattern/deviations: Decreased step length - right;Decreased step length - left;Trunk flexed Gait velocity: decreased   General Gait Details: Unsteady due to severe back pain with short choppy steps and requiring intermittent min assist to remain steady.  Pt unable to ambulate farther due to pain.   Stairs            Wheelchair Mobility    Modified Rankin (Stroke Patients Only)       Balance Overall balance assessment: Needs assistance Sitting-balance support: Single extremity supported;Feet supported Sitting balance-Leahy Scale: Poor Sitting balance - Comments: Pt relies on at least 1UE support sitting EOB due to severe back pain   Standing balance support: Bilateral upper extremity supported;During functional activity Standing balance-Leahy Scale: Poor Standing balance comment: Pt relies on BUE support on RW for static and dynamic activity  Pertinent Vitals/Pain Pain Assessment: Faces Faces Pain Scale: Hurts worst Pain Location: Low back all the way up to neck Pain Descriptors / Indicators: Grimacing;Guarding;Moaning;Restless(tearful) Pain Intervention(s): Limited activity within patient's tolerance;Monitored during session;Repositioned;Utilized relaxation techniques    Home Living Family/patient expects to be  discharged to:: Private residence Living Arrangements: Spouse/significant other;Non-relatives/Friends(lives in boarding home) Available Help at Discharge: Friend(s);Available PRN/intermittently Type of Home: House Home Access: Stairs to enter Entrance Stairs-Rails: Right Entrance Stairs-Number of Steps: 3 Home Layout: Two level Home Equipment: Grab bars - tub/shower(says her walker is broken)      Prior Function Level of Independence: Independent   Gait / Transfers Assistance Needed: Pt ambulates without AD. Denies any falls in the past 6 months.   ADL's / Homemaking Assistance Needed: Ind with ADLs        Hand Dominance        Extremity/Trunk Assessment   Upper Extremity Assessment Upper Extremity Assessment: Overall WFL for tasks assessed(Unable to formally assess due to high level of pain)    Lower Extremity Assessment Lower Extremity Assessment: Overall WFL for tasks assessed(Unable to formally assess due to high level of pain)    Cervical / Trunk Assessment Cervical / Trunk Assessment: Other exceptions Cervical / Trunk Exceptions: severe LBP all the way up to neck  Communication   Communication: No difficulties  Cognition Arousal/Alertness: Awake/alert Behavior During Therapy: WFL for tasks assessed/performed Overall Cognitive Status: Within Functional Limits for tasks assessed                                        General Comments General comments (skin integrity, edema, etc.): BP in supine 104/67, in sitting EOB 120/76.  HR up to 110 with activity.     Exercises     Assessment/Plan    PT Assessment Patient needs continued PT services  PT Problem List Pain;Decreased activity tolerance;Decreased balance;Decreased mobility;Decreased knowledge of use of DME;Decreased safety awareness;Decreased knowledge of precautions       PT Treatment Interventions DME instruction;Gait training;Stair training;Therapeutic activities;Therapeutic  exercise;Functional mobility training;Balance training;Neuromuscular re-education;Modalities;Patient/family education    PT Goals (Current goals can be found in the Care Plan section)  Acute Rehab PT Goals Patient Stated Goal: decreased pain and return to PLOF PT Goal Formulation: With patient Time For Goal Achievement: 01/22/18 Potential to Achieve Goals: Good(as long as pain is controlled)    Frequency Min 2X/week   Barriers to discharge Decreased caregiver support;Inaccessible home environment Full flight of steps to get to bedroom and steps to enter home.  Pt alone during the day at times.     Co-evaluation               AM-PAC PT "6 Clicks" Mobility  Outcome Measure Help needed turning from your back to your side while in a flat bed without using bedrails?: A Little Help needed moving from lying on your back to sitting on the side of a flat bed without using bedrails?: A Little Help needed moving to and from a bed to a chair (including a wheelchair)?: A Little Help needed standing up from a chair using your arms (e.g., wheelchair or bedside chair)?: A Little Help needed to walk in hospital room?: A Little Help needed climbing 3-5 steps with a railing? : A Lot 6 Click Score: 17    End of Session Equipment Utilized During Treatment: Gait belt Activity Tolerance: Patient limited by pain  Patient left: in chair;with call bell/phone within reach;with chair alarm set;Other (comment)(with pillow under knees for comfort) Nurse Communication: Mobility status;Other (comment)(pt's high level of pain) PT Visit Diagnosis: Pain;Other abnormalities of gait and mobility (R26.89);Unsteadiness on feet (R26.81);Difficulty in walking, not elsewhere classified (R26.2) Pain - Right/Left: (lower, mid, and upper) Pain - part of body: (back and neck)    Time: 1610-9604 PT Time Calculation (min) (ACUTE ONLY): 30 min   Charges:   PT Evaluation $PT Eval Moderate Complexity: 1 Mod PT  Treatments $Therapeutic Activity: 8-22 mins        Encarnacion Chu PT, DPT 01/08/2018, 11:51 AM

## 2018-01-08 NOTE — Progress Notes (Signed)
[facility] [place] [facility]   PATIENT NAME: [name]    MR#:  [id number]  DATE OF BIRTH:  [birth year]  SUBJECTIVE:  CHIEF COMPLAINT:   Chief Complaint  Patient presents with  . Back Pain  Patient seen and evaluated today Has low back pain No bowel bladder incontinence No complaints of any chest pain   REVIEW OF SYSTEMS:    ROS  CONSTITUTIONAL: No documented fever. No fatigue, weakness. No weight gain, no weight loss.  EYES: No blurry or double vision.  ENT: No tinnitus. No postnasal drip. No redness of the oropharynx.  RESPIRATORY: No cough, no wheeze, no hemoptysis. No dyspnea.  CARDIOVASCULAR: No chest pain. No orthopnea. No palpitations. No syncope.  GASTROINTESTINAL: No nausea, no vomiting or diarrhea. No abdominal pain. No melena or hematochezia.  GENITOURINARY: Has dysuria , no hematuria.  ENDOCRINE: No polyuria or nocturia. No heat or cold intolerance.  HEMATOLOGY: No anemia. No bruising. No bleeding.  INTEGUMENTARY: No rashes. No lesions.  MUSCULOSKELETAL: No arthritis. No swelling. No gout.  Has back pain. NEUROLOGIC: No numbness, tingling, or ataxia. No seizure-type activity.  PSYCHIATRIC: No anxiety. No insomnia. No ADD.   DRUG ALLERGIES:   Allergies  Allergen Reactions  . Flagyl [Metronidazole] Other (See Comments)    "my eyes go back in the back of my head"    VITALS:  Blood pressure 95/61, pulse 96, temperature 99.5 F (37.5 C), temperature source Axillary, resp. rate 19, height 5' (1.524 m), weight 44.5 kg, SpO2 96 %.  PHYSICAL EXAMINATION:   Physical Exam  GENERAL:  52 y.o.-year-old patient lying in the bed with no acute distress.  EYES: Pupils equal, round, reactive to light and accommodation. No scleral icterus. Extraocular muscles intact.  HEENT: Head atraumatic, normocephalic. Oropharynx and nasopharynx clear.  NECK:  Supple, no jugular venous distention. No thyroid enlargement, no tenderness.  LUNGS: Normal  breath sounds bilaterally, no wheezing, rales, rhonchi. No use of accessory muscles of respiration.  CARDIOVASCULAR: S1, S2 normal. No murmurs, rubs, or gallops.  ABDOMEN: Soft, nontender, nondistended. Bowel sounds present. No organomegaly or mass.  EXTREMITIES: No cyanosis, clubbing or edema b/l.   Low back pain  NEUROLOGIC: Cranial nerves II through XII are intact. No focal Motor or sensory deficits b/l.   PSYCHIATRIC: The patient is alert and oriented x 3.  SKIN: No obvious rash, lesion, or ulcer.   LABORATORY PANEL:   CBC Recent Labs  Lab 01/08/18 0425  WBC 10.8*  HGB 12.6  HCT 36.3  PLT 96*   ------------------------------------------------------------------------------------------------------------------ Chemistries  Recent Labs  Lab 01/07/18 1619 01/08/18 0425  NA 135 137  K 3.8 3.2*  CL 99 107  CO2 20* 21*  GLUCOSE 109* 114*  BUN 20 20  CREATININE 1.66* 1.41*  CALCIUM 8.7* 7.3*  AST 39  --   ALT 29  --   ALKPHOS 62  --   BILITOT 0.3  --    ------------------------------------------------------------------------------------------------------------------  Cardiac Enzymes No results for input(s): TROPONINI in the last 168 hours. ------------------------------------------------------------------------------------------------------------------  RADIOLOGY:  Dg Lumbar Spine 2-3 Views  Result Date: 01/07/2018 CLINICAL DATA:  52 year old female with back pain x3 days. EXAM: LUMBAR SPINE - 2-3 VIEW COMPARISON:  CT of the abdomen pelvis dated 01/07/2018 FINDINGS: There is no acute fracture or subluxation of the lumbar spine. Severe levoscoliosis similar to prior CT with multilevel degenerative changes most prominent at L3-L4. The visualized posterior elements appear intact. The soft tissues are unremarkable. IMPRESSION: 1. No acute/traumatic lumbar  spine pathology. 2. Levoscoliosis and degenerative changes. Electronically Signed   By: Elgie Collard M.D.   On:  01/07/2018 22:12   Ct Renal Stone Study  Result Date: 01/07/2018 CLINICAL DATA:  Low back pain for 3 days. EXAM: CT ABDOMEN AND PELVIS WITHOUT CONTRAST TECHNIQUE: Multidetector CT imaging of the abdomen and pelvis was performed following the standard protocol without IV contrast. COMPARISON:  11/27/2016 lumbar spine radiographs FINDINGS: Lower chest: Top normal heart size without pericardial effusion. Atelectasis and/or scarring at the lung bases. No effusion or pneumothorax. Hepatobiliary: No focal liver abnormality is seen. No gallstones, gallbladder wall thickening, or biliary dilatation. Probable biliary sludge along the dependent aspect of the gallbladder counting for differential attenuation seen within. Pancreas: Normal Spleen: Normal Adrenals/Urinary Tract: Normal bilateral adrenal glands. Low attenuating lesion in the lower pole the right kidney likely to represent a small cyst. This measures 8 mm. No nephrolithiasis nor hydroureteronephrosis. Stomach/Bowel: Stomach is within normal limits. Status post appendectomy. No evidence of bowel wall thickening, distention, or inflammatory changes. Vascular/Lymphatic: Scattered aortoiliac atherosclerosis without aneurysm. Phleboliths are noted within pelvis. No lymphadenopathy. Reproductive: Uterus and bilateral adnexa are unremarkable. Other: No free air or free fluid. Musculoskeletal: Levorotatory scoliosis of the lumbar spine with facet arthrosis. Degenerative disc disease L3-4 with discogenic sclerosis and disc flattening. Mild multilevel facet arthropathy lumbar spine greatest from L3 through S1. IMPRESSION: 1. Levorotatory scoliosis of the lumbar spine with degenerative disc disease, greatest at L3-4. Multilevel degenerative facet arthropathy. No acute osseous appearing abnormality. 2. Probable biliary sludge in the gallbladder without secondary signs of cholecystitis. 3. 8 mm hypodensity in the right kidney statistically consistent with a cyst. No  nephrolithiasis nor obstructive uropathy. Electronically Signed   By: Tollie Eth M.D.   On: 01/07/2018 19:55     ASSESSMENT AND PLAN:  52 year old female patient with history of bipolar disorder, schizophrenia, substance abuse, seizures, CVA, tobacco abuse currently under hospitalist service for back pain  -Acute hypokalemia Replace potassium aggressively  -Acute urinary tract infection High on IV Rocephin antibiotic For follow-up WBC count and cultures  -Leukocytosis improved with antibiotics  -Back pain No bowel bladder incontinence Lumbar spinal x-ray did not reveal any acute fractures and chest showed degenerative changes Pain management and muscle relaxants Physical therapy evaluation PRN IV Dilaudid for pain Tramadol for control of back pain  -Tobacco abuse Tobacco cessation counseled to the patient for 6 minutes Nicotine patch offered  -Seizure disorder Continue Depakote and Dilantin orally  -Dehydration IV fluids   All the records are reviewed and case discussed with Care Management/Social Worker. Management plans discussed with the patient, family and they are in agreement.  CODE STATUS: Full code  DVT Prophylaxis: SCDs  TOTAL TIME TAKING CARE OF THIS PATIENT: 35 minutes.   POSSIBLE D/C IN 1 to 2 DAYS, DEPENDING ON CLINICAL CONDITION.  Ihor Austin M.D on 01/08/2018 at 12:12 PM  Between 7am to 6pm - Pager - [phone]  After 6pm go to [email or web address] - password [email or web address]  [facility]  Office  [phone]  CC: Primary care physician; System, Provider Not In  Note: This dictation was prepared with Dragon dictation along with smaller phrase technology. Any transcriptional errors that result from this process are unintentional.

## 2018-01-08 NOTE — Clinical Social Work Note (Signed)
Clinical Social Work Assessment  Patient Details  Name: [name] MRN: [id number] Date of Birth: December 10, 1965  Date of referral:  01/08/18               Reason for consult:  Facility Placement                Permission sought to share information with:    Permission granted to share information::     Name::        Agency::     Relationship::     Contact Information:     Housing/Transportation Living arrangements for the past 2 months:  International Paper of Information:  Patient Patient Interpreter Needed:  None Criminal Activity/Legal Involvement Pertinent to Current Situation/Hospitalization:  No - Comment as needed Significant Relationships:  Friend Lives with:  Self Do you feel safe going back to the place where you live?  Yes Need for family participation in patient care:  Yes (Comment)  Care giving concerns:  Patient has lived in a boarding house in [place] for 7 years.    Social Worker assessment / plan:  Holiday representative (CSW) received verbal consult from PT that recommendation is SNF however if patient's pain gets under control she will likely do better with PT. CSW met with patient alone at bedside to discuss D/C plan. Patient was alert and oriented X4 and was sitting up in the chair at bedside. CSW introduced self and explained role of CSW department. Patient reported that she is in pain and wants to get back in bed. CSW made RN aware of above. Patient reported that she is independent at baseline and lives at a boarding house. CSW explained SNF process. Per patient she does not want to go to SNF and stated that she can't sign over her disability check to the facility because she has bills to pay. CSW explained that [employer] would have to approve for short term rehab. CSW explained short term rehab option. Patient stated that she does not want to go to SNF for short term or long term and will discharge home. Per patient she has 19 stairs to get up and she usually  navigates them well. Patient reported that once her pain is better she will be back to her old self. RN case Freight forwarder, RN and MD aware of above. CSW will continue to follow and assist as needed.   Employment status:  Disabled (Comment on whether or not currently receiving Disability) Insurance information:  [place], [employer] PT Recommendations:  [place] / Referral to community resources:  Other (Comment Required)(Patient refused SNF. )  Patient/Family's Response to care:  Patient refused SNF.   Patient/Family's Understanding of and Emotional Response to Diagnosis, Current Treatment, and Prognosis:  Patient was pleasant and thanked CSW for visit.   Emotional Assessment Appearance:  Appears stated age Attitude/Demeanor/Rapport:    Affect (typically observed):  Pleasant Orientation:  Oriented to Self, Oriented to Place, Oriented to  Time, Oriented to Situation Alcohol / Substance use:  Not Applicable Psych involvement (Current and /or in the community):  No (Comment)  Discharge Needs  Concerns to be addressed:  Discharge Planning Concerns Readmission within the last 30 days:  No Current discharge risk:  Dependent with Mobility Barriers to Discharge:  Continued Medical Work up   [employer], Veronia Beets, LCSW 01/08/2018, 12:00 PM

## 2018-01-08 NOTE — Progress Notes (Signed)
Advanced care plan. Purpose of the Encounter: CODE STATUS Parties in Attendance: Patient Patient's Decision Capacity: Good Subjective/Patient's story: Presented to emergency room for back pain Objective/Medical story Has elevated WBC count Probably has atypical UTI needs IV antibiotics Pain management for back pain and physical therapy Needs tobacco cessation therapy Goals of care determination:  Advance care directives goals of care and treatment plan discussed Patient wants everything done which includes cpr, intuabtion and ventilator if need arises CODE STATUS: Full code Time spent discussing advanced care planning: 16 minutes

## 2018-01-08 NOTE — [place] FL2 (Signed)
[place] MEDICAID FL2 LEVEL OF CARE SCREENING TOOL     IDENTIFICATION  Patient Name: [name] Birthdate: 07-02-1965 Sex: female Admission Date (Current Location): 01/07/2018  [facility] [place] and [place] Number:  [address] ([phone] [id number]) Facility and Address:  [facility], [address], [place], [place] [address]      Provider Number: [id number]  Attending Physician Name and Address:  Ihor Austin, MD  Relative Name and Phone Number:       Current Level of Care: Hospital Recommended Level of Care: Skilled Nursing Facility Prior Approval Number:    Date Approved/Denied:   PASRR Number:    Discharge Plan: SNF    Current Diagnoses: Patient Active Problem List   Diagnosis Date Noted  . Acute renal failure (ARF) (HCC) 01/07/2018  . Seizure (HCC) 08/14/2017  . Somatic dysfunction of spine, lumbar 12/11/2016  . Somatic dysfunction of spine, thoracic 12/11/2016  . Low back pain 11/27/2016  . Spasm of muscle of lower back 11/27/2016  . Mass involving both sides of chest wall   . Cyst of skin of breast 03/18/2016  . Right wrist pain 03/15/2016  . Eczema 05/16/2015  . Cocaine abuse in remission (HCC) 08/22/2014  . Seizure disorder (HCC) 08/21/2014    Orientation RESPIRATION BLADDER Height & Weight     Self, Time, Situation, Place  Normal Continent Weight: 98 lb 3.2 oz (44.5 kg) Height:  5' (152.4 cm)  BEHAVIORAL SYMPTOMS/MOOD NEUROLOGICAL BOWEL NUTRITION STATUS      Continent Diet(Diet: Regular )  AMBULATORY STATUS COMMUNICATION OF NEEDS Skin   Extensive Assist Verbally Normal                       Personal Care Assistance Level of Assistance  Bathing, Feeding, Dressing Bathing Assistance: Limited assistance Feeding assistance: Independent Dressing Assistance: Limited assistance     Functional Limitations Info  Sight, Hearing, Speech Sight Info: Adequate Hearing Info: Adequate Speech Info: Adequate    SPECIAL CARE FACTORS  FREQUENCY  PT (By licensed PT), OT (By licensed OT)     PT Frequency: (5) OT Frequency: (5)            Contractures      Additional Factors Info  Code Status, Allergies Code Status Info: (Full Code. ) Allergies Info: (Flagyl Metronidazole)           Current Medications (01/08/2018):  This is the current hospital active medication list Current Facility-Administered Medications  Medication Dose Route Frequency Provider Last Rate Last Dose  . 0.9 %  sodium chloride infusion   Intravenous Continuous Houston Siren, MD 100 mL/hr at 01/08/18 0328    . acetaminophen (TYLENOL) tablet 650 mg  650 mg Oral Q6H PRN Houston Siren, MD   650 mg at 01/08/18 0018   Or  . acetaminophen (TYLENOL) suppository 650 mg  650 mg Rectal Q6H PRN Sainani, Rolly Pancake, MD      . albuterol (PROVENTIL) (2.5 MG/3ML) 0.083% nebulizer solution 2.5 mg  2.5 mg Inhalation Q4H PRN Houston Siren, MD      . aspirin EC tablet 81 mg  81 mg Oral Daily Houston Siren, MD   81 mg at 01/08/18 0802  . cefTRIAXone (ROCEPHIN) 1 g in sodium chloride 0.9 % 100 mL IVPB  1 g Intravenous Q24H Houston Siren, MD      . cyclobenzaprine (FLEXERIL) tablet 7.5 mg  7.5 mg Oral TID PRN Ihor Austin, MD   7.5 mg at 01/08/18  0935  . divalproex (DEPAKOTE) DR tablet 500 mg  500 mg Oral q morning - 10a Sainani, Rolly Pancake, MD       And  . divalproex (DEPAKOTE) DR tablet 1,000 mg  1,000 mg Oral QPM Houston Siren, MD   1,000 mg at 01/08/18 0803  . heparin injection 5,000 Units  5,000 Units Subcutaneous Q8H Houston Siren, MD   5,000 Units at 01/08/18 0546  . HYDROmorphone (DILAUDID) injection 0.5 mg  0.5 mg Intravenous Q4H PRN Cammy Copa, MD   0.5 mg at 01/08/18 1123  . ondansetron (ZOFRAN) tablet 4 mg  4 mg Oral Q6H PRN Houston Siren, MD       Or  . ondansetron (ZOFRAN) injection 4 mg  4 mg Intravenous Q6H PRN Houston Siren, MD      . phenytoin (DILANTIN) ER capsule 300 mg  300 mg Oral QHS Sainani, Rolly Pancake, MD      .  QUEtiapine (SEROQUEL) tablet 100 mg  100 mg Oral QHS Houston Siren, MD   100 mg at 01/07/18 2302  . traMADol (ULTRAM) tablet 50 mg  50 mg Oral Q12H PRN Houston Siren, MD   50 mg at 01/08/18 0802     Discharge Medications: Please see discharge summary for a list of discharge medications.  Relevant Imaging Results:  Relevant Lab Results:   Additional Information (SSN: [id number])  Valerye Kobus, Darleen Crocker, LCSW

## 2018-01-09 DIAGNOSIS — N179 Acute kidney failure, unspecified: Secondary | ICD-10-CM | POA: Diagnosis not present

## 2018-01-09 LAB — BASIC METABOLIC PANEL
Anion gap: 8 (ref 5–15)
BUN: 11 mg/dL (ref 6–20)
CO2: 20 mmol/L — ABNORMAL LOW (ref 22–32)
Calcium: 8 mg/dL — ABNORMAL LOW (ref 8.9–10.3)
Chloride: 110 mmol/L (ref 98–111)
Creatinine, Ser: 0.94 mg/dL (ref 0.44–1.00)
GFR calc non Af Amer: >60 mL/min (ref >60)
Glucose, Bld: 110 mg/dL — ABNORMAL HIGH (ref 70–99)
Potassium: 3.2 mmol/L — ABNORMAL LOW (ref 3.5–5.1)
Sodium: 138 mmol/L (ref 135–145)

## 2018-01-09 LAB — URINE CULTURE: Culture: <10000 — AB

## 2018-01-09 MED ORDER — DIVALPROEX SODIUM 500 MG PO DR TAB: 1000.0000 mg | DELAYED_RELEASE_TABLET | Freq: Every evening | ORAL | 0 refills | Status: DC

## 2018-01-09 MED ORDER — POTASSIUM CHLORIDE CRYS ER 20 MEQ PO TBCR
40.0000 meq | EXTENDED_RELEASE_TABLET | Freq: Once | ORAL | Status: AC
  Administered 2018-01-09: 40 meq via ORAL
  Filled 2018-01-09: qty 2

## 2018-01-09 MED ORDER — CIPROFLOXACIN HCL 250 MG PO TABS: 250.0000 mg | ORAL_TABLET | Freq: Two times a day (BID) | ORAL | 0 refills | Status: AC

## 2018-01-09 MED ORDER — DIVALPROEX SODIUM 500 MG PO DR TAB: 500.0000 mg | DELAYED_RELEASE_TABLET | Freq: Every morning | ORAL | 0 refills | Status: DC

## 2018-01-09 NOTE — Care Management (Signed)
PCP is [facility]

## 2018-01-09 NOTE — Care Management (Signed)
Address is correct but this is her contact [phone].  Patient would like home health [facility].  Barbara CowerJason with [facility] notified.  Youth walker requested from [facility]. Her ride will be available if she can leave before 1030. MD updated.

## 2018-01-09 NOTE — Plan of Care (Signed)
  Problem: Activity: Goal: Risk for activity intolerance will decrease Outcome: Progressing   Problem: Nutrition: Goal: Adequate nutrition will be maintained Outcome: Progressing   

## 2018-01-09 NOTE — Discharge Summary (Signed)
[facility] [place] [facility]   PATIENT NAME: [name]    MR#:  [id number]  DATE OF BIRTH:  12/15/1965  DATE OF ADMISSION:  01/07/2018 ADMITTING PHYSICIAN: Houston SirenVivek J Sainani, MD  DATE OF DISCHARGE: 01/09/2018 10:38 AM  PRIMARY CARE PHYSICIAN: System, Provider Not In   ADMISSION DIAGNOSIS:  Back pain [M54.9] AKI (acute kidney injury) (HCC) [N17.9] Low back pain, unspecified back pain laterality, unspecified chronicity, unspecified whether sciatica present [M54.5]  DISCHARGE DIAGNOSIS:  Acute kidney injury Dehydration Low back pain secondary to muscle spasm Acute cystitis Leukocytosis Acute hypokalemia  SECONDARY DIAGNOSIS:   Past Medical History:  Diagnosis Date  . Acute renal failure (ARF) (HCC) 01/07/2018  . Allergy   . Anemia   . Arthritis   . Asthma    NO INHALERS  . Bipolar disorder (HCC)   . Cocaine abuse in remission (HCC)   . Depression   . Dyspnea   . Eczema   . Nervous   . Schizophrenia (HCC)   . Scoliosis   . Seizure (HCC)   . Seizures (HCC)    Last seizure, May 20, 2016-PT WAS ALONE-UNSURE LENGTH OF SEIZURE-SCRAPED NOSE AND LIP  . Stroke [facility](HCC) 2007  . Tobacco use      ADMITTING HISTORY [name]  is a 52 y.o. female with a known history of bipolar disorder, schizophrenia, history of substance abuse, seizures, asthma, previous history of CVA, ongoing tobacco abuse who presents to the hospital due to back pain.  Patient is a very poor historian but says she developed back pain/left lower quadrant abdominal pain about 1 to 2 days ago.  She also has developed some nausea and vomiting associated with this and has not been able to keep any of her pills down.  She denies having any seizures even though she has a seizure history.  She presents to the ER his symptoms are not improving and was noted to be in acute kidney injury and noted to have an abnormal urinalysis possibly consistent with a UTI.  Hospitalist services were  contacted for admission.  Patient says that due to her back/abdominal pain she is been taking increasing amounts of ibuprofen over the past couple days at least 7-8 times daily.  She denies any fevers chills, chest pains, shortness of breath, hematuria, dysuria, melena, hematochezia or any other associated symptoms presently.  HOSPITAL COURSE:  Patient was admitted to medical floor.  Patient was worked up with CT kidney stone protocol which did not show any nephrolithiasis.  Gallbladder sludge noted but no cholecystitis was evident.  Patient had low back pain.  She was worked up with lumbar spine x-rays which did not reveal any acute fracture.  Degenerative disc disease at L3-L4 level was noted.  Anti-inflammatory medications help for the pain.  Patient received physical therapy evaluation.  She did not want to go to skilled nursing facility and rehab.  Patient opted to go to boarding home where she resides with home health services home physical therapy and occupational therapy services.  Her back pain improved.  Abdominal discomfort also improved.  Kidney functions improved after IV fluid hydration.  Patient received IV Rocephin antibiotic during the stay in the hospital.  Urine culture showed less than 10,000 colonies growth..  Patient also had low potassium which was aggressively replaced during hospitalization. leukocytosis resolved.  CONSULTS OBTAINED:    DRUG ALLERGIES:   Allergies  Allergen Reactions  . Flagyl [Metronidazole] Other (See Comments)    "my eyes go  back in the back of my head"    DISCHARGE MEDICATIONS:   Allergies as of 01/09/2018      Reactions   Flagyl [metronidazole] Other (See Comments)   "my eyes go back in the back of my head"      Medication List    STOP taking these medications   atorvastatin 40 MG tablet Commonly known as:  LIPITOR   zolpidem 5 MG tablet Commonly known as:  AMBIEN     TAKE these medications   aspirin EC 81 MG tablet Take 1 tablet by  mouth daily.   ciprofloxacin 250 MG tablet Commonly known as:  CIPRO Take 1 tablet (250 mg total) by mouth 2 (two) times daily for 3 days.   divalproex 500 MG DR tablet Commonly known as:  DEPAKOTE Take 2 tablets (1,000 mg total) by mouth every evening. What changed:  You were already taking a medication with the same name, and this prescription was added. Make sure you understand how and when to take each.   divalproex 500 MG DR tablet Commonly known as:  DEPAKOTE Take 1 tablet (500 mg total) by mouth every morning. Start taking on:  01/10/2018 What changed:    how much to take  when to take this   ibuprofen 600 MG tablet Commonly known as:  ADVIL,MOTRIN Take 1 tablet by mouth daily as needed.   meloxicam 7.5 MG tablet Commonly known as:  MOBIC Take 1 tablet by mouth 2 (two) times daily.   phenytoin 100 MG ER capsule Commonly known as:  DILANTIN Take 3 capsules (300 mg total) by mouth at bedtime.   QUEtiapine 100 MG tablet Commonly known as:  SEROQUEL Take 100 mg by mouth at bedtime.   traZODone 150 MG tablet Commonly known as:  DESYREL Take 75 mg by mouth daily.   triamcinolone 0.025 % cream Commonly known as:  KENALOG Apply 1 application topically 4 (four) times daily as needed.   VENTOLIN HFA 108 (90 Base) MCG/ACT inhaler Generic drug:  albuterol Inhale 2 puffs into the lungs as needed.            Durable Medical Equipment  (From admission, onward)         Start     Ordered   01/09/18 0930  For home use only DME Walker rolling  Once    Comments:  Youth walker  Question:  Patient needs a walker to treat with the following condition  Answer:  Walking troubles   01/09/18 0929   01/09/18 0929  For home use only DME Walker youth  Once    Question:  Patient needs a walker to treat with the following condition  Answer:  Gait instability   01/09/18 0928          Today  Patient seen and evaluated today Back pain improved Tolerating diet well No  fever No nausea or vomiting VITAL SIGNS:  Blood pressure 110/68, pulse 94, temperature (!) 97.5 F (36.4 C), resp. rate 18, height 5' (1.524 m), weight 44.5 kg, SpO2 98 %.  I/O:    Intake/Output Summary (Last 24 hours) at 01/09/2018 1226 Last data filed at 01/09/2018 0543 Gross per 24 hour  Intake 2109.04 ml  Output 600 ml  Net 1509.04 ml    PHYSICAL EXAMINATION:  Physical Exam  GENERAL:  52 y.o.-year-old patient lying in the bed with no acute distress.  LUNGS: Normal breath sounds bilaterally, no wheezing, rales,rhonchi or crepitation. No use of accessory muscles of respiration.  CARDIOVASCULAR: S1, S2 normal. No murmurs, rubs, or gallops.  ABDOMEN: Soft, non-tender, non-distended. Bowel sounds present. No organomegaly or mass.  NEUROLOGIC: Moves all 4 extremities. PSYCHIATRIC: The patient is alert and oriented x 3.  SKIN: No obvious rash, lesion, or ulcer.   DATA REVIEW:   CBC Recent Labs  Lab 01/08/18 0425  WBC 10.8*  HGB 12.6  HCT 36.3  PLT 96*    Chemistries  Recent Labs  Lab 01/07/18 1619  01/09/18 0419  NA 135   < > 138  K 3.8   < > 3.2*  CL 99   < > 110  CO2 20*   < > 20*  GLUCOSE 109*   < > 110*  BUN 20   < > 11  CREATININE 1.66*   < > 0.94  CALCIUM 8.7*   < > 8.0*  AST 39  --   --   ALT 29  --   --   ALKPHOS 62  --   --   BILITOT 0.3  --   --    < > = values in this interval not displayed.    Cardiac Enzymes No results for input(s): TROPONINI in the last 168 hours.  Microbiology Results  Results for orders placed or performed during the hospital encounter of 01/07/18  Urine culture     Status: Abnormal   Collection Time: 01/07/18  4:19 PM  Result Value Ref Range Status   Specimen Description   Final    URINE, RANDOM Performed at [facility], [address], [place], [place] [address]    Special Requests   Final    NONE Performed at [facility], [address] [place], [place] [address]    Culture (A)  Final     <10,000 COLONIES/mL INSIGNIFICANT GROWTH Performed at [facility], [address] [place], [place] [address]    Report Status 01/09/2018 FINAL  Final    RADIOLOGY:  Dg Lumbar Spine 2-3 Views  Result Date: 01/07/2018 CLINICAL DATA:  52 year old female with back pain x3 days. EXAM: LUMBAR SPINE - 2-3 VIEW COMPARISON:  CT of the abdomen pelvis dated 01/07/2018 FINDINGS: There is no acute fracture or subluxation of the lumbar spine. Severe levoscoliosis similar to prior CT with multilevel degenerative changes most prominent at L3-L4. The visualized posterior elements appear intact. The soft tissues are unremarkable. IMPRESSION: 1. No acute/traumatic lumbar spine pathology. 2. Levoscoliosis and degenerative changes. Electronically Signed   By: Elgie Collard M.D.   On: 01/07/2018 22:12   Ct Renal Stone Study  Result Date: 01/07/2018 CLINICAL DATA:  Low back pain for 3 days. EXAM: CT ABDOMEN AND PELVIS WITHOUT CONTRAST TECHNIQUE: Multidetector CT imaging of the abdomen and pelvis was performed following the standard protocol without IV contrast. COMPARISON:  11/27/2016 lumbar spine radiographs FINDINGS: Lower chest: Top normal heart size without pericardial effusion. Atelectasis and/or scarring at the lung bases. No effusion or pneumothorax. Hepatobiliary: No focal liver abnormality is seen. No gallstones, gallbladder wall thickening, or biliary dilatation. Probable biliary sludge along the dependent aspect of the gallbladder counting for differential attenuation seen within. Pancreas: Normal Spleen: Normal Adrenals/Urinary Tract: Normal bilateral adrenal glands. Low attenuating lesion in the lower pole the right kidney likely to represent a small cyst. This measures 8 mm. No nephrolithiasis nor hydroureteronephrosis. Stomach/Bowel: Stomach is within normal limits. Status post appendectomy. No evidence of bowel wall thickening, distention, or inflammatory changes. Vascular/Lymphatic: Scattered  aortoiliac atherosclerosis without aneurysm. Phleboliths are noted  within pelvis. No lymphadenopathy. Reproductive: Uterus and bilateral adnexa are unremarkable. Other: No free air or free fluid. Musculoskeletal: Levorotatory scoliosis of the lumbar spine with facet arthrosis. Degenerative disc disease L3-4 with discogenic sclerosis and disc flattening. Mild multilevel facet arthropathy lumbar spine greatest from L3 through S1. IMPRESSION: 1. Levorotatory scoliosis of the lumbar spine with degenerative disc disease, greatest at L3-4. Multilevel degenerative facet arthropathy. No acute osseous appearing abnormality. 2. Probable biliary sludge in the gallbladder without secondary signs of cholecystitis. 3. 8 mm hypodensity in the right kidney statistically consistent with a cyst. No nephrolithiasis nor obstructive uropathy. Electronically Signed   By: Tollie Eth M.D.   On: 01/07/2018 19:55    Follow up with PCP in 1 week.  Management plans discussed with the patient, family and they are in agreement.  CODE STATUS: Full code    Code Status Orders  (From admission, onward)         Start     Ordered   01/07/18 2147  Full code  Continuous     01/07/18 2146        Code Status History    Date Active Date Inactive Code Status Order ID Comments User Context   08/14/2017 1457 08/15/2017 1826 Full Code [id number]  Milagros Loll, MD ED   08/21/2014 0756 08/23/2014 1551 Full Code [id number]  Arnaldo Natal, MD Inpatient      TOTAL TIME TAKING CARE OF THIS PATIENT ON DAY OF DISCHARGE: more than 35 minutes.   Ihor Austin M.D on 01/09/2018 at 12:26 PM  Between 7am to 6pm - Pager - [phone]  After 6pm go to [email or web address] - password [email or web address]  [facility] [place] [facility]  Office  [phone]  CC: Primary care physician; System, Provider Not In  Note: This dictation was prepared with Dragon dictation along with smaller phrase technology. Any transcriptional errors that result from this  process are unintentional.

## 2018-01-09 NOTE — Progress Notes (Signed)
Patient discharged home today. Walker delivered. DC & Rx instructions given. IV removed. Friend here to pick up patient.

## 2018-01-15 ENCOUNTER — Emergency Department
Admission: EM | Admit: 2018-01-15 | Discharge: 2018-01-16 | Disposition: A | Discharge status: Home / Self Care | Payer: Medicare HMO | Attending: Emergency Medicine | Admitting: Emergency Medicine

## 2018-01-15 ENCOUNTER — Other Ambulatory Visit: Payer: Self-pay

## 2018-01-15 DIAGNOSIS — N390 Urinary tract infection, site not specified: Secondary | ICD-10-CM | POA: Diagnosis not present

## 2018-01-15 DIAGNOSIS — Z79899 Other long term (current) drug therapy: Secondary | ICD-10-CM | POA: Diagnosis not present

## 2018-01-15 DIAGNOSIS — M5442 Lumbago with sciatica, left side: Secondary | ICD-10-CM | POA: Insufficient documentation

## 2018-01-15 DIAGNOSIS — J45909 Unspecified asthma, uncomplicated: Secondary | ICD-10-CM | POA: Diagnosis not present

## 2018-01-15 DIAGNOSIS — Z8673 Personal history of transient ischemic attack (TIA), and cerebral infarction without residual deficits: Secondary | ICD-10-CM | POA: Insufficient documentation

## 2018-01-15 DIAGNOSIS — F17210 Nicotine dependence, cigarettes, uncomplicated: Secondary | ICD-10-CM | POA: Diagnosis not present

## 2018-01-15 DIAGNOSIS — G8929 Other chronic pain: Secondary | ICD-10-CM | POA: Diagnosis not present

## 2018-01-15 DIAGNOSIS — M545 Low back pain: Secondary | ICD-10-CM | POA: Diagnosis present

## 2018-01-15 MED ORDER — LORAZEPAM 2 MG/ML IJ SOLN
1.0000 mg | Freq: Once | INTRAMUSCULAR | Status: AC
  Administered 2018-01-15: 1 mg via INTRAVENOUS
  Filled 2018-01-15: qty 1

## 2018-01-15 MED ORDER — KETOROLAC TROMETHAMINE 30 MG/ML IJ SOLN
15.0000 mg | Freq: Once | INTRAMUSCULAR | Status: AC
  Administered 2018-01-15: 15 mg via INTRAVENOUS
  Filled 2018-01-15: qty 1

## 2018-01-15 NOTE — ED Notes (Signed)
Pt triaged, call bell within reach, warm blanket provided, awaits provider for evaluation at at this time.

## 2018-01-15 NOTE — ED Triage Notes (Signed)
Pt BIB EMS for midline back pain from cervical spine all the way down her back and radiates to left leg, pt denies any loss of bowel or bladder control. Recent admission for bladder infection, Denies any urinary s/s. Afebrile.Taking meloxicam at home without relief.

## 2018-01-16 ENCOUNTER — Emergency Department: Payer: Medicare HMO

## 2018-01-16 DIAGNOSIS — M5442 Lumbago with sciatica, left side: Secondary | ICD-10-CM | POA: Diagnosis not present

## 2018-01-16 LAB — URINALYSIS, ROUTINE W REFLEX MICROSCOPIC
Bilirubin Urine: NEGATIVE
Glucose, UA: NEGATIVE mg/dL
Hgb urine dipstick: NEGATIVE
Ketones, ur: NEGATIVE mg/dL
Nitrite: NEGATIVE
Protein, ur: 30 mg/dL — AB
SPECIFIC GRAVITY, URINE: 1.02 (ref 1.005–1.030)
pH: 5 (ref 5.0–8.0)

## 2018-01-16 LAB — COMPREHENSIVE METABOLIC PANEL
ALT: 15 U/L (ref 0–44)
AST: 23 U/L (ref 15–41)
Albumin: 2.9 g/dL — ABNORMAL LOW (ref 3.5–5.0)
Alkaline Phosphatase: 51 U/L (ref 38–126)
Anion gap: 9 (ref 5–15)
BUN: 9 mg/dL (ref 6–20)
CO2: 27 mmol/L (ref 22–32)
Calcium: 10 mg/dL (ref 8.9–10.3)
Chloride: 97 mmol/L — ABNORMAL LOW (ref 98–111)
Creatinine, Ser: 0.56 mg/dL (ref 0.44–1.00)
GFR calc Af Amer: >60 mL/min (ref >60)
Glucose, Bld: 100 mg/dL — ABNORMAL HIGH (ref 70–99)
Potassium: 3.4 mmol/L — ABNORMAL LOW (ref 3.5–5.1)
Sodium: 133 mmol/L — ABNORMAL LOW (ref 135–145)
Total Bilirubin: 1.3 mg/dL — ABNORMAL HIGH (ref 0.3–1.2)
Total Protein: 7.2 g/dL (ref 6.5–8.1)

## 2018-01-16 LAB — TROPONIN I: Troponin I: <0.03 ng/mL (ref <0.03)

## 2018-01-16 LAB — CBC WITH DIFFERENTIAL/PLATELET
Abs Immature Granulocytes: 0.13 10*3/uL — ABNORMAL HIGH (ref 0.00–0.07)
BASOS PCT: 0 %
Basophils Absolute: 0 10*3/uL (ref 0.0–0.1)
Eosinophils Absolute: 0 10*3/uL (ref 0.0–0.5)
Eosinophils Relative: 0 %
HCT: 33.8 % — ABNORMAL LOW (ref 36.0–46.0)
Hemoglobin: 11.8 g/dL — ABNORMAL LOW (ref 12.0–15.0)
Immature Granulocytes: 1 %
Lymphocytes Relative: 12 %
Lymphs Abs: 1.7 10*3/uL (ref 0.7–4.0)
MCH: 31 pg (ref 26.0–34.0)
MCHC: 34.9 g/dL (ref 30.0–36.0)
MCV: 88.7 fL (ref 80.0–100.0)
Monocytes Absolute: 1.3 10*3/uL — ABNORMAL HIGH (ref 0.1–1.0)
Monocytes Relative: 9 %
NEUTROS ABS: 11.4 10*3/uL — AB (ref 1.7–7.7)
Neutrophils Relative %: 78 %
PLATELETS: 350 10*3/uL (ref 150–400)
RBC: 3.81 MIL/uL — ABNORMAL LOW (ref 3.87–5.11)
RDW: 12.6 % (ref 11.5–15.5)
WBC: 14.6 10*3/uL — ABNORMAL HIGH (ref 4.0–10.5)
nRBC: 0 % (ref 0.0–0.2)

## 2018-01-16 LAB — LACTIC ACID, PLASMA: Lactic Acid, Venous: 0.9 mmol/L (ref 0.5–1.9)

## 2018-01-16 MED ORDER — CEPHALEXIN 500 MG PO CAPS: 500.0000 mg | ORAL_CAPSULE | Freq: Two times a day (BID) | ORAL | 0 refills | Status: DC

## 2018-01-16 MED ORDER — CEPHALEXIN 500 MG PO CAPS
500.0000 mg | ORAL_CAPSULE | Freq: Once | ORAL | Status: AC
  Administered 2018-01-16: 500 mg via ORAL
  Filled 2018-01-16: qty 1

## 2018-01-16 MED ORDER — MORPHINE SULFATE (PF) 4 MG/ML IV SOLN
4.0000 mg | Freq: Once | INTRAVENOUS | Status: AC
  Administered 2018-01-16: 4 mg via INTRAVENOUS
  Filled 2018-01-16: qty 1

## 2018-01-16 MED ORDER — CYCLOBENZAPRINE HCL 5 MG PO TABS: 5.0000 mg | ORAL_TABLET | Freq: Three times a day (TID) | ORAL | 0 refills | Status: DC | PRN

## 2018-01-16 NOTE — Discharge Instructions (Signed)
You have been seen in the Emergency Department (ED)  today for back pain.  Your workup and exam have not shown any acute abnormalities and you are likely suffering from muscle strain or possible problems with your discs, but there is no treatment that will fix your symptoms at this time.  Please continue taking the medications you have at home and the new medications I prescribed.  You may have another urinary tract infection and we have given you a prescription for antibiotics that you should take until the medication is gone.  Please follow up with your doctor as soon as possible regarding today's ED visit and your back pain.  Return to the ED for worsening back pain, fever, weakness or numbness of either leg, or if you develop either (1) an inability to urinate or have bowel movements, or (2) loss of your ability to control your bathroom functions (if you start having "accidents"), or if you develop other new symptoms that concern you.

## 2018-01-16 NOTE — ED Notes (Signed)
Pt. returned from XR. 

## 2018-01-16 NOTE — ED Notes (Signed)
Pt medicated for pain, updated on POC,mof the floor to XR at this time.

## 2018-01-16 NOTE — ED Notes (Signed)
Pt asleep at this time

## 2018-01-16 NOTE — ED Notes (Signed)
Pt verbalized understanding of d/c instructions, rx, and f/u care. No further questions at this time. Pt assisted to exit via wheelchair. 

## 2018-01-16 NOTE — ED Provider Notes (Signed)
[facility] Emergency Department Provider Note  ____________________________________________   First MD Initiated Contact with Patient 01/15/18 2332     (approximate)  I have reviewed the triage vital signs and the nursing notes.   HISTORY  Chief Complaint Back Pain    HPI [name] is a 52 y.o. female with a complicated medical and psychiatric history as listed below.  No history of IV drug use.  She reports that she has had chronic back pain for years and she presents tonight for that back pain.  She says that it is severe and any amount of movement makes it worse.  Nothing in particular makes it better.  She goes to [employer] and has an Investment banker, operational who she said is Dr. Allena Katz (but apparently not Signa Kell).  She does not go to pain management.  She has had no recent traumas, falls, accidents, etc.  She has had no urinary incontinence nor retention.  She denies numbness, tingling, and weakness.  She reports that the pain starts in the left side of her lower back and radiates down her buttocks and into her left thigh.  This is consistent with her pain in the past.  She denies fever/chills, chest pain, shortness of breath, nausea, vomiting, abdominal pain, and dysuria.  She reports that she was recently in the hospital for the back pain and a possible urinary tract infection.   Past Medical History:  Diagnosis Date  . Acute renal failure (ARF) (HCC) 01/07/2018  . Allergy   . Anemia   . Arthritis   . Asthma    NO INHALERS  . Bipolar disorder (HCC)   . Cocaine abuse in remission (HCC)   . Depression   . Dyspnea   . Eczema   . Nervous   . Schizophrenia (HCC)   . Scoliosis   . Seizure (HCC)   . Seizures (HCC)    Last seizure, May 20, 2016-PT WAS ALONE-UNSURE LENGTH OF SEIZURE-SCRAPED NOSE AND LIP  . Stroke [facility]) 2007  . Tobacco use     Patient Active Problem List   Diagnosis Date Noted  . Acute renal failure (ARF) (HCC)  01/07/2018  . Seizure (HCC) 08/14/2017  . Somatic dysfunction of spine, lumbar 12/11/2016  . Somatic dysfunction of spine, thoracic 12/11/2016  . Low back pain 11/27/2016  . Spasm of muscle of lower back 11/27/2016  . Mass involving both sides of chest wall   . Cyst of skin of breast 03/18/2016  . Right wrist pain 03/15/2016  . Eczema 05/16/2015  . Cocaine abuse in remission (HCC) 08/22/2014  . Seizure disorder (HCC) 08/21/2014    Past Surgical History:  Procedure Laterality Date  . APPENDECTOMY  2000  . BACK SURGERY  2012  . BREAST CYST EXCISION Bilateral 05/29/2016   Procedure: MASS EXCISION CHEST WALL;  Surgeon: Ricarda Frame, MD;  Location: [facility] ORS;  Service: General;  Laterality: Bilateral;  . DILATION AND CURETTAGE OF UTERUS      Prior to Admission medications   Medication Sig Start Date End Date Taking? Authorizing Provider  aspirin EC 81 MG tablet Take 1 tablet by mouth daily. 10/15/17 10/15/18  [provider]  cephALEXin (KEFLEX) 500 MG capsule Take 1 capsule (500 mg total) by mouth 2 (two) times daily. 01/16/18   Loleta Rose, MD  cyclobenzaprine (FLEXERIL) 5 MG tablet Take 1 tablet (5 mg total) by mouth 3 (three) times daily as needed for muscle spasms. 01/16/18   Loleta Rose, MD  divalproex (DEPAKOTE) 500 MG DR tablet Take 1 tablet (500 mg total) by mouth every morning. 01/10/18 02/09/18  Ihor AustinPyreddy, Pavan, MD  divalproex (DEPAKOTE) 500 MG DR tablet Take 2 tablets (1,000 mg total) by mouth every evening. 01/09/18 02/08/18  Ihor AustinPyreddy, Pavan, MD  ibuprofen (ADVIL,MOTRIN) 600 MG tablet Take 1 tablet by mouth daily as needed. 01/06/18   [provider]  meloxicam (MOBIC) 7.5 MG tablet Take 1 tablet by mouth 2 (two) times daily. 12/12/17   [provider]  phenytoin (DILANTIN) 100 MG ER capsule Take 3 capsules (300 mg total) by mouth at bedtime. 08/15/17 01/07/18  Enid BaasKalisetti, Radhika, MD  QUEtiapine (SEROQUEL) 100 MG tablet Take 100 mg by mouth at bedtime.     [provider]  traZODone (DESYREL) 150 MG tablet Take 75 mg by mouth daily. 07/25/17   [provider]  triamcinolone (KENALOG) 0.025 % cream Apply 1 application topically 4 (four) times daily as needed. 12/31/17   [provider]  VENTOLIN HFA 108 (90 Base) MCG/ACT inhaler Inhale 2 puffs into the lungs as needed. 12/29/17   [provider]    Allergies Flagyl [metronidazole]  Family History  Problem Relation Age of Onset  . Epilepsy Mother   . Seizures Mother   . Heart disease Father   . Epilepsy Maternal Grandmother   . Heart attack Maternal Grandmother     Social History Social History   Tobacco Use  . Smoking status: Current Every Day Smoker    Packs/day: 0.25    Years: 47.00    Pack years: 11.75    Types: Cigarettes  . Smokeless tobacco: Never Used  . Tobacco comment: Interested in resources   Substance Use Topics  . Alcohol use: Not Currently    Alcohol/week: 49.0 standard drinks    Types: 49 Cans of beer per week  . Drug use: No    Types: Cocaine    Review of Systems Constitutional: No fever/chills Eyes: No visual changes. ENT: No sore throat. Cardiovascular: Denies chest pain. Respiratory: Denies shortness of breath. Gastrointestinal: No abdominal pain.  No nausea, no vomiting.  No diarrhea.  No constipation. Genitourinary: Negative for dysuria. Musculoskeletal: Acute on chronic low back pain radiating down the back of the left leg. Integumentary: Negative for rash. Neurological: Negative for headaches, focal weakness or numbness.   ____________________________________________   PHYSICAL EXAM:  VITAL SIGNS: ED Triage Vitals  Enc Vitals Group     BP 01/15/18 2246 (!) 144/81     Pulse Rate 01/15/18 2246 89     Resp 01/15/18 2246 16     Temp 01/15/18 2246 99.4 F (37.4 C)     Temp Source 01/15/18 2246 Oral     SpO2 01/15/18 2244 100 %     Weight 01/15/18 2247 34.5 kg (76 lb)     Height 01/15/18 2247 1.524 m  (5')     Head Circumference --      Peak Flow --      Pain Score 01/15/18 2247 10     Pain Loc --      Pain Edu? --      Excl. in GC? --     Constitutional: Alert and oriented.  Appears older than stated age.  Does not appear to be in acute distress although sometimes she yells out about her pain, wanting ice, being cold and needing blankets, etc. appears older than chronological age. Eyes: Conjunctivae are normal.  Head: Atraumatic. Nose: No congestion/rhinnorhea. Mouth/Throat: Mucous membranes are moist.  Neck: No stridor.  No meningeal signs.   Cardiovascular: Normal rate, regular rhythm. Good peripheral circulation. Grossly normal heart sounds. Respiratory: Normal respiratory effort.  No retractions. Lungs CTAB. Gastrointestinal: Soft and nontender. No distention.  Musculoskeletal: History of scoliosis.  No tenderness to palpation of the cervical or thoracic spine.  As soon as I touched the skin of her left hip she yelled and jumped.  I explained to her that I was not pushing hard and she calm down and was able to talk to me throughout the rest of the examination.  She has some tenderness to palpation of the paraspinal muscles on the left side of the lumbar spine and she reports tenderness to palpation down the left buttock and into the left thigh.  Pain with straight leg extension of the leg.  No subjective sensory deficits.  No evidence of fluctuance nor induration in her lumbar spine or cellulitis to suggest an infection. Neurologic:  Normal speech and language. No gross focal neurologic deficits are appreciated.  Skin:  Skin is warm, dry and intact. No rash noted.  ____________________________________________   LABS (all labs ordered are listed, but only abnormal results are displayed)  Labs Reviewed  CBC WITH DIFFERENTIAL/PLATELET - Abnormal; Notable for the following components:      Result Value   WBC 14.6 (*)    RBC 3.81 (*)    Hemoglobin 11.8 (*)    HCT 33.8 (*)     Neutro Abs 11.4 (*)    Monocytes Absolute 1.3 (*)    Abs Immature Granulocytes 0.13 (*)    All other components within normal limits  COMPREHENSIVE METABOLIC PANEL - Abnormal; Notable for the following components:   Sodium 133 (*)    Potassium 3.4 (*)    Chloride 97 (*)    Glucose, Bld 100 (*)    Albumin 2.9 (*)    Total Bilirubin 1.3 (*)    All other components within normal limits  URINALYSIS, ROUTINE W REFLEX MICROSCOPIC - Abnormal; Notable for the following components:   Color, Urine YELLOW (*)    APPearance HAZY (*)    Protein, ur 30 (*)    Leukocytes, UA TRACE (*)    Bacteria, UA RARE (*)    All other components within normal limits  URINE CULTURE  LACTIC ACID, PLASMA  TROPONIN I   ____________________________________________  EKG  None - EKG not ordered by ED physician ____________________________________________  RADIOLOGY   ED MD interpretation: No acute abnormalities identified on radiographs  Official radiology report(s): Dg Lumbar Spine 2-3 Views  Result Date: 01/16/2018 CLINICAL DATA:  Back and left leg pain. EXAM: LUMBAR SPINE - 2-3 VIEW COMPARISON:  01/07/2018. FINDINGS: Five non-rib-bearing lumbar vertebrae. Stable marked levoconvex thoracolumbar rotary scoliosis. Multilevel degenerative changes. No fractures or pars defects. Bilateral pelvic phleboliths. IMPRESSION: Stable scoliosis and multilevel degenerative changes. No acute abnormality. Electronically Signed   By: Beckie Salts M.D.   On: 01/16/2018 00:53   Dg Pelvis 1-2 Views  Result Date: 01/16/2018 CLINICAL DATA:  Back and left leg pain. EXAM: PELVIS - 1-2 VIEW COMPARISON:  Lumbar spine radiographs obtained at the same time. Abdomen and pelvis CT dated 01/07/2018. FINDINGS: Stable normal appearing pelvic bones. Stable lower lumbar spine degenerative changes and scoliosis. Atheromatous arterial calcifications. IMPRESSION: No acute abnormality. Electronically Signed   By: Beckie Salts M.D.   On:  01/16/2018 00:55    ____________________________________________   PROCEDURES  Critical Care performed: No   Procedure(s) performed:   Procedures  ____________________________________________   INITIAL IMPRESSION / ASSESSMENT AND PLAN / ED COURSE  As part of my medical decision making, I reviewed the following data within the electronic MEDICAL RECORD NUMBER Nursing notes reviewed and incorporated, Labs reviewed , Old chart reviewed, Radiograph reviewed , Notes from prior ED visits and [place] Controlled Substance Database    Differential diagnosis includes, but is not limited to, acute on chronic back pain, sciatica, cauda equina syndrome, osteomyelitis/discitis, epidural abscess.  The patient has no neurological deficits and her exam is similar to her last emergency department visit.  She has normal vital signs and is in no acute distress in general although occasionally she will yell out particular when she tries to move.  Her physical exam is most consistent with sciatica and she tells me that the pain is the same as it has been for years, just gets worse sometimes.  There is no indication for an emergent MRI at this time.  She has no sign of acute infection and is hemodynamically stable.  Radiographs are similar to prior.  CBC demonstrates an elevated white blood cell count but this is similar to prior as well.  The last time she had a greater leukocytosis and was admitted and no acute abnormalities were identified including, based on my chart review, a urine culture that was nonspecific.  Medications were administered as listed below.    Clinical Course as of Jan 17 548  Fri Jan 16, 2018  0028 Lactic acid is normal, CBC is 14.6 which is lower than it has been previously but still elevated.  Urinalysis does have white blood cells as did prior urinalyses but it is nitrite negative.  I have sent a urine culture.   [CF]  0029 Patient has received Ativan 1 mg IV and Toradol 15 mg IV.  I am  also giving morphine 4 mg IV as described above.   [CF]  0056 Lactic Acid, Venous: 0.9 [CF]  0056 Troponin I: <0.03 [CF]  0102 No acute abnormality identified on pelvis radiographs nor lumbar spine radiographs.  Stable from prior.  I will update the patient, provide an empiric prescription of Keflex for possible urinary tract infection, prescribed Flexeril to go along with the meloxicam she told me she already is taking, and give her follow-up information with Dr. Lourdes Sledge with the pain clinic and Dr. Yves Dill with physiatry.  I gave my usual and customary back pain return precautions.   [CF]  0107 I reviewed the patient's prescription history over the last 24 months in the multi-state controlled substances database(s) that includes [place], [place], [place], [place], [place], [place], [place], [place], [place], [place], [place], [place], [place], [employer], [place], [place], [place], [place], [place], [place], [place], [place], [place], [place], [place], [place], [place], [place], [place], [place], [place], [place] PMP, and [place].  Results were notable for regular prescriptions for Ambien, otherwise unremarkable.   [CF]    Clinical Course User Index [CF] Loleta Rose, MD    ____________________________________________  FINAL CLINICAL IMPRESSION(S) / ED DIAGNOSES  Final diagnoses:  Chronic left-sided low back pain with left-sided sciatica  Urinary tract infection without hematuria, site unspecified     MEDICATIONS GIVEN DURING THIS VISIT:  Medications  LORazepam (ATIVAN) injection 1 mg (1 mg Intravenous Given 01/15/18 2347)  ketorolac (TORADOL) 30 MG/ML injection 15 mg (15 mg Intravenous Given 01/15/18 2348)  morphine 4 MG/ML injection 4 mg (4 mg Intravenous Given 01/16/18 0028)  cephALEXin (KEFLEX)  capsule 500 mg (500 mg Oral Given 01/16/18 0027)     ED Discharge Orders         Ordered      cephALEXin (KEFLEX) 500 MG capsule  2 times daily     01/16/18 0108    cyclobenzaprine (FLEXERIL) 5 MG tablet  3 times daily PRN     01/16/18 0108           Note:  This document was prepared using Dragon voice recognition software and may include unintentional dictation errors.    Loleta Rose, MD 01/16/18 [phone]

## 2018-01-17 LAB — URINE CULTURE
Culture: NO GROWTH
Special Requests: NORMAL

## 2018-02-04 ENCOUNTER — Other Ambulatory Visit: Payer: Self-pay

## 2018-02-04 ENCOUNTER — Emergency Department: Payer: Medicare Other

## 2018-02-04 ENCOUNTER — Emergency Department
Admission: EM | Admit: 2018-02-04 | Discharge: 2018-02-04 | Disposition: A | Discharge status: Home / Self Care | Payer: Medicare Other | Attending: Emergency Medicine | Admitting: Emergency Medicine

## 2018-02-04 ENCOUNTER — Encounter: Payer: Self-pay | Admitting: Emergency Medicine

## 2018-02-04 DIAGNOSIS — R0789 Other chest pain: Secondary | ICD-10-CM | POA: Insufficient documentation

## 2018-02-04 DIAGNOSIS — Z7982 Long term (current) use of aspirin: Secondary | ICD-10-CM | POA: Diagnosis not present

## 2018-02-04 DIAGNOSIS — K219 Gastro-esophageal reflux disease without esophagitis: Secondary | ICD-10-CM | POA: Diagnosis not present

## 2018-02-04 DIAGNOSIS — F17210 Nicotine dependence, cigarettes, uncomplicated: Secondary | ICD-10-CM | POA: Diagnosis not present

## 2018-02-04 DIAGNOSIS — J45909 Unspecified asthma, uncomplicated: Secondary | ICD-10-CM | POA: Diagnosis not present

## 2018-02-04 DIAGNOSIS — R52 Pain, unspecified: Secondary | ICD-10-CM

## 2018-02-04 DIAGNOSIS — Z79899 Other long term (current) drug therapy: Secondary | ICD-10-CM | POA: Insufficient documentation

## 2018-02-04 DIAGNOSIS — R198 Other specified symptoms and signs involving the digestive system and abdomen: Secondary | ICD-10-CM | POA: Diagnosis present

## 2018-02-04 LAB — HEPATIC FUNCTION PANEL
ALBUMIN: 3.2 g/dL — AB (ref 3.5–5.0)
ALT: 11 U/L (ref 0–44)
AST: 23 U/L (ref 15–41)
Alkaline Phosphatase: 74 U/L (ref 38–126)
Bilirubin, Direct: 0.2 mg/dL (ref 0.0–0.2)
Indirect Bilirubin: 0.6 mg/dL (ref 0.3–0.9)
Total Bilirubin: 0.8 mg/dL (ref 0.3–1.2)
Total Protein: 8.4 g/dL — ABNORMAL HIGH (ref 6.5–8.1)

## 2018-02-04 LAB — CBC
HCT: 33.2 % — ABNORMAL LOW (ref 36.0–46.0)
Hemoglobin: 11.2 g/dL — ABNORMAL LOW (ref 12.0–15.0)
MCH: 29.9 pg (ref 26.0–34.0)
MCHC: 33.7 g/dL (ref 30.0–36.0)
MCV: 88.8 fL (ref 80.0–100.0)
NRBC: 0 % (ref 0.0–0.2)
PLATELETS: 440 10*3/uL — AB (ref 150–400)
RBC: 3.74 MIL/uL — ABNORMAL LOW (ref 3.87–5.11)
RDW: 12.7 % (ref 11.5–15.5)
WBC: 12.9 10*3/uL — ABNORMAL HIGH (ref 4.0–10.5)

## 2018-02-04 LAB — BASIC METABOLIC PANEL
Anion gap: 11 (ref 5–15)
BUN: 6 mg/dL (ref 6–20)
CO2: 23 mmol/L (ref 22–32)
Calcium: 9.5 mg/dL (ref 8.9–10.3)
Chloride: 99 mmol/L (ref 98–111)
Creatinine, Ser: 0.51 mg/dL (ref 0.44–1.00)
GFR calc non Af Amer: >60 mL/min (ref >60)
Glucose, Bld: 125 mg/dL — ABNORMAL HIGH (ref 70–99)
Potassium: 3.9 mmol/L (ref 3.5–5.1)
Sodium: 133 mmol/L — ABNORMAL LOW (ref 135–145)

## 2018-02-04 LAB — TROPONIN I: Troponin I: <0.03 ng/mL (ref <0.03)

## 2018-02-04 MED ORDER — SODIUM CHLORIDE 0.9 % IV BOLUS
500.0000 mL | Freq: Once | INTRAVENOUS | Status: AC
  Administered 2018-02-04: 500 mL via INTRAVENOUS

## 2018-02-04 MED ORDER — LIDOCAINE VISCOUS HCL 2 % MT SOLN
15.0000 mL | Freq: Once | OROMUCOSAL | Status: AC
  Administered 2018-02-04: 15 mL via ORAL
  Filled 2018-02-04: qty 15

## 2018-02-04 MED ORDER — DICYCLOMINE HCL 10 MG/5ML PO SOLN
10.0000 mg | Freq: Once | ORAL | Status: AC
  Administered 2018-02-04: 10 mg via ORAL
  Filled 2018-02-04 (×2): qty 5

## 2018-02-04 MED ORDER — ALUM & MAG HYDROXIDE-SIMETH 200-200-20 MG/5ML PO SUSP
30.0000 mL | Freq: Once | ORAL | Status: AC
  Administered 2018-02-04: 30 mL via ORAL
  Filled 2018-02-04 (×2): qty 30

## 2018-02-04 MED ORDER — ESOMEPRAZOLE MAGNESIUM 40 MG PO CPDR: 40.0000 mg | DELAYED_RELEASE_CAPSULE | Freq: Every day | ORAL | 1 refills | Status: DC

## 2018-02-04 MED ORDER — SUCRALFATE 1 GM/10ML PO SUSP: 1.0000 g | Freq: Four times a day (QID) | ORAL | 1 refills | Status: DC

## 2018-02-04 NOTE — ED Notes (Signed)
Called pharmacy for bentyl.

## 2018-02-04 NOTE — Discharge Instructions (Addendum)
Please take the Carafate as directed.  Make sure to take it 2 hours before or after any other medication as it can interfere with other medications working.  You can also take the Nexium.  Both of these should help with the pain.  Please follow-up with Dr. Maximino Greenland the gastroenterologist.  Give her office a call to set up the appointment.  Return here for any further problems.

## 2018-02-04 NOTE — ED Triage Notes (Signed)
Pt in via POV, complaints of burning and reflux x a few days.  Pt states, "I can take my pills but they feel like they get stuck and then come back up.  Water and food does the same."  Pt denies any hx of reflux disease.  Pt tachycardic upon arrival, all other vitals WDL.

## 2018-02-04 NOTE — ED Provider Notes (Addendum)
[facility] Emergency Department Provider Note   ____________________________________________   First MD Initiated Contact with Patient 02/04/18 1301     (approximate)  I have reviewed the triage vital signs and the nursing notes.   HISTORY  Chief Complaint Gastroesophageal Reflux    HPI [name] is a 53 y.o. female patient reports for the last few weeks she has been having burning on swallowing the burning starts in the back of her throat runs all the way down to her stomach.  And lately everything she swallows comes back up again she is water pills this morning had to vomit and then vomited back up again water comes up again food comes up again everything comes up.  This is been going on for days.  Past Medical History:  Diagnosis Date  . Acute renal failure (ARF) ([place]) 01/07/2018  . Allergy   . Anemia   . Arthritis   . Asthma    NO INHALERS  . Bipolar disorder ([place])   . Cocaine abuse in remission ([place])   . Depression   . Dyspnea   . Eczema   . Nervous   . Schizophrenia ([place])   . Scoliosis   . Seizure ([place])   . Seizures ([place])    Last seizure, May 20, 2016-PT WAS ALONE-UNSURE LENGTH OF SEIZURE-SCRAPED NOSE AND LIP  . Stroke [facility]) 2007  . Tobacco use     Patient Active Problem List   Diagnosis Date Noted  . Acute renal failure (ARF) ([place]) 01/07/2018  . Seizure ([place]) 08/14/2017  . Somatic dysfunction of spine, lumbar 12/11/2016  . Somatic dysfunction of spine, thoracic 12/11/2016  . Low back pain 11/27/2016  . Spasm of muscle of lower back 11/27/2016  . Mass involving both sides of chest wall   . Cyst of skin of breast 03/18/2016  . Right wrist pain 03/15/2016  . Eczema 05/16/2015  . Cocaine abuse in remission ([place]) 08/22/2014  . Seizure disorder ([place]) 08/21/2014    Past Surgical History:  Procedure Laterality Date  . APPENDECTOMY  2000  . BACK SURGERY  2012  . BREAST CYST EXCISION Bilateral 05/29/2016   Procedure: MASS  EXCISION CHEST WALL;  Surgeon: Clayburn Pert, MD;  Location: [facility] ORS;  Service: General;  Laterality: Bilateral;  . DILATION AND CURETTAGE OF UTERUS      Prior to Admission medications   Medication Sig Start Date End Date Taking? Authorizing Provider  aspirin EC 81 MG tablet Take 1 tablet by mouth daily. 10/15/17 10/15/18  [provider]  cephALEXin (KEFLEX) 500 MG capsule Take 1 capsule (500 mg total) by mouth 2 (two) times daily. 01/16/18   Hinda Kehr, MD  cyclobenzaprine (FLEXERIL) 5 MG tablet Take 1 tablet (5 mg total) by mouth 3 (three) times daily as needed for muscle spasms. 01/16/18   Hinda Kehr, MD  divalproex (DEPAKOTE) 500 MG DR tablet Take 1 tablet (500 mg total) by mouth every morning. 01/10/18 02/09/18  Saundra Shelling, MD  divalproex (DEPAKOTE) 500 MG DR tablet Take 2 tablets (1,000 mg total) by mouth every evening. 01/09/18 02/08/18  Saundra Shelling, MD  esomeprazole (NEXIUM) 40 MG capsule Take 1 capsule (40 mg total) by mouth daily. 02/04/18 02/04/19  Nena Polio, MD  ibuprofen (ADVIL,MOTRIN) 600 MG tablet Take 1 tablet by mouth daily as needed. 01/06/18   [provider]  meloxicam (MOBIC) 7.5 MG tablet Take 1 tablet by mouth 2 (two) times daily. 12/12/17   [provider]  phenytoin (DILANTIN) 100 MG ER capsule Take 3 capsules (300 mg total) by mouth at bedtime. 08/15/17 01/07/18  Gladstone Lighter, MD  QUEtiapine (SEROQUEL) 100 MG tablet Take 100 mg by mouth at bedtime.    [provider]  sucralfate (CARAFATE) 1 GM/10ML suspension Take 10 mLs (1 g total) by mouth 4 (four) times daily. Take 2 hours before or after any other medication.  This medication can act like a sponge and absorb other medications and keep him from working unless it is taken by itself. 02/04/18 02/04/19  Nena Polio, MD  traZODone (DESYREL) 150 MG tablet Take 75 mg by mouth daily. 07/25/17   [provider]  triamcinolone (KENALOG) 0.025 % cream Apply 1  application topically 4 (four) times daily as needed. 12/31/17   [provider]  VENTOLIN HFA 108 (90 Base) MCG/ACT inhaler Inhale 2 puffs into the lungs as needed. 12/29/17   [provider]    Allergies Flagyl [metronidazole]  Family History  Problem Relation Age of Onset  . Epilepsy Mother   . Seizures Mother   . Heart disease Father   . Epilepsy Maternal Grandmother   . Heart attack Maternal Grandmother     Social History Social History   Tobacco Use  . Smoking status: Current Some Day Smoker    Packs/day: 0.25    Years: 47.00    Pack years: 11.75    Types: Cigarettes  . Smokeless tobacco: Never Used  . Tobacco comment: Interested in resources   Substance Use Topics  . Alcohol use: Not Currently    Alcohol/week: 49.0 standard drinks    Types: 49 Cans of beer per week  . Drug use: No    Types: Cocaine    Review of Systems  Constitutional: No fever/chills Eyes: No visual changes. ENT: No sore throat. Cardiovascular: Denies chest pain. Respiratory: Denies shortness of breath. Gastrointestinal see HPI. Genitourinary: Negative for dysuria. Musculoskeletal: Tonic back pain. Skin: Negative for rash. Neurological: Negative for headaches, focal weakness   ____________________________________________   PHYSICAL EXAM:  VITAL SIGNS: ED Triage Vitals  Enc Vitals Group     BP 02/04/18 1153 122/80     Pulse Rate 02/04/18 1153 (!) 128     Resp --      Temp 02/04/18 1153 99.4 F (37.4 C)     Temp Source 02/04/18 1153 Oral     SpO2 02/04/18 1153 99 %     Weight 02/04/18 1155 78 lb (35.4 kg)     Height 02/04/18 1155 5' (1.524 m)     Head Circumference --      Peak Flow --      Pain Score 02/04/18 1154 5     Pain Loc --      Pain Edu? --      Excl. in [place]? --     Constitutional: Alert and oriented. Well appearing and in no acute distress. Eyes: Conjunctivae are normal.  Head: Atraumatic. Nose: No congestion/rhinnorhea. Mouth/Throat:  Mucous membranes are moist.  Oropharynx non-erythematous. Neck: No stridor.  No palpable cervical nodes Cardiovascular: Normal rate, regular rhythm. Grossly normal heart sounds.  Good peripheral circulation. Respiratory: Normal respiratory effort.  No retractions. Lungs CTAB. Gastrointestinal: Soft and nontender except for in the epigastric area where it is tender to palpation. No distention. No abdominal bruits. No CVA tenderness. Musculoskeletal: No lower extremity tenderness nor edema.  Neurologic:  Normal speech and language. No gross focal neurologic deficits are appreciated. No gait instability. Skin:  Skin  is warm, dry and intact. No rash noted. Psychiatric: Mood and affect are normal. Speech and behavior are normal.  ____________________________________________   LABS (all labs ordered are listed, but only abnormal results are displayed)  Labs Reviewed  BASIC METABOLIC PANEL - Abnormal; Notable for the following components:      Result Value   Sodium 133 (*)    Glucose, Bld 125 (*)    All other components within normal limits  CBC - Abnormal; Notable for the following components:   WBC 12.9 (*)    RBC 3.74 (*)    Hemoglobin 11.2 (*)    HCT 33.2 (*)    Platelets 440 (*)    All other components within normal limits  HEPATIC FUNCTION PANEL - Abnormal; Notable for the following components:   Total Protein 8.4 (*)    Albumin 3.2 (*)    All other components within normal limits  TROPONIN I   ____________________________________________  EKG  EKG read interpreted by me shows sinus tach rate 111 normal axis T wave flattening in lateral and inferior chest leads which is new from prior ____________________________________________  RADIOLOGY  ED MD interpretation: Chest x-ray read by radiology reviewed by me is negative except for scoliosis and left basilar atelectasis  Official radiology report(s): Dg Chest 2 View  Result Date: 02/04/2018 CLINICAL DATA:  Patient with  chest pain. EXAM: CHEST - 2 VIEW COMPARISON:  Chest radiograph 08/14/2017 FINDINGS: Normal cardiac and mediastinal contours. No consolidative pulmonary opacities. No pleural effusion or pneumothorax. Scoliotic curvature of the thoracolumbar spine. IMPRESSION: No acute cardiopulmonary process.  Left basilar atelectasis. Electronically Signed   By: Lovey Newcomer M.D.   On: 02/04/2018 12:43    ____________________________________________   PROCEDURES  Procedure(s) performed:   Procedures  Critical Care performed:   ____________________________________________   INITIAL IMPRESSION / ASSESSMENT AND PLAN / ED COURSE  Patient's met B does not look bad.  Her white count is slightly elevated I will call GI and see if they want me to do a swallowing study with barium or what they like to look in their first to see why this patient is unable to swallow her food. ----------------------------------------- 4:15 PM on 02/04/2018 -----------------------------------------  Discussed with Dr. Bonna Gains.  If patient cannot keep down water we will keep her in the hospital.  She can come in today to see the patient.  If she does keep down water Dr. Bonna Gains would like to keep her in the hospital anyway.  Patient keeps down water, pain goes away with GI cocktail patient wants to go home in fact she is insisting on going home.  I will have her follow-up outpatient with Dr. Bonna Gains.  Please note I discussed the admission with the patient several times and she is aware that she should stay in the hospital but she does not want to stay in the hospital.        ____________________________________________   FINAL CLINICAL IMPRESSION(S) / ED DIAGNOSES  Final diagnoses:  Pain  Gastroesophageal reflux disease, esophagitis presence not specified     ED Discharge Orders         Ordered    sucralfate (CARAFATE) 1 GM/10ML suspension  4 times daily     02/04/18 1612    esomeprazole (NEXIUM) 40 MG capsule   Daily     02/04/18 1612           Note:  This document was prepared using Dragon voice recognition software and may include unintentional dictation errors.  Nena Polio, MD 02/04/18 1616    Nena Polio, MD 02/04/18 2240

## 2018-02-08 ENCOUNTER — Emergency Department
Admission: EM | Admit: 2018-02-08 | Discharge: 2018-02-08 | Disposition: A | Discharge status: Home / Self Care | Payer: Medicare Other | Attending: Emergency Medicine | Admitting: Emergency Medicine

## 2018-02-08 ENCOUNTER — Other Ambulatory Visit: Payer: Self-pay

## 2018-02-08 DIAGNOSIS — Z79899 Other long term (current) drug therapy: Secondary | ICD-10-CM | POA: Insufficient documentation

## 2018-02-08 DIAGNOSIS — R1310 Dysphagia, unspecified: Secondary | ICD-10-CM | POA: Insufficient documentation

## 2018-02-08 DIAGNOSIS — F17210 Nicotine dependence, cigarettes, uncomplicated: Secondary | ICD-10-CM | POA: Insufficient documentation

## 2018-02-08 DIAGNOSIS — Z8673 Personal history of transient ischemic attack (TIA), and cerebral infarction without residual deficits: Secondary | ICD-10-CM | POA: Diagnosis not present

## 2018-02-08 DIAGNOSIS — Z7982 Long term (current) use of aspirin: Secondary | ICD-10-CM | POA: Insufficient documentation

## 2018-02-08 DIAGNOSIS — J029 Acute pharyngitis, unspecified: Secondary | ICD-10-CM | POA: Insufficient documentation

## 2018-02-08 MED ORDER — DEXAMETHASONE SODIUM PHOSPHATE 10 MG/ML IJ SOLN
10.0000 mg | Freq: Once | INTRAMUSCULAR | Status: AC
  Administered 2018-02-08: 10 mg via INTRAMUSCULAR
  Filled 2018-02-08: qty 1

## 2018-02-08 MED ORDER — PREDNISONE 50 MG PO TABS: ORAL_TABLET | ORAL | 0 refills | Status: DC

## 2018-02-08 MED ORDER — FAMOTIDINE 20 MG PO TABS: 20.0000 mg | ORAL_TABLET | Freq: Two times a day (BID) | ORAL | 1 refills | Status: DC

## 2018-02-08 NOTE — ED Provider Notes (Signed)
[facility] Emergency Department Provider Note  ____________________________________________  Time seen: Approximately 4:28 PM  I have reviewed the triage vital signs and the nursing notes.   HISTORY  Chief Complaint Gastroesophageal Reflux    HPI [name] is a 53 y.o. female presents to the emergency department with dysphasia and phayngitis.  Patient reports that when she is supine, she is coughing up clear sputum.  Patient also reports that after she eats, she sometimes regurgitates her food.  Patient is observed drinking a [facility] in the emergency department.  Patient was seen for similar complaints on 02/04/2017 and was advised to follow-up with GI for barium swallow.  Patient has not followed up with GI as instructed.  She has been taking PPI as directed but states that medication is not improving her symptoms.  No other alleviating measures have been attempted.   Past Medical History:  Diagnosis Date  . Acute renal failure (ARF) ([facility]) 01/07/2018  . Allergy   . Anemia   . Arthritis   . Asthma    NO INHALERS  . Bipolar disorder ([facility])   . Cocaine abuse in remission ([facility])   . Depression   . Dyspnea   . Eczema   . Nervous   . Schizophrenia ([facility])   . Scoliosis   . Seizure ([facility])   . Seizures ([facility])    Last seizure, May 20, 2016-PT WAS ALONE-UNSURE LENGTH OF SEIZURE-SCRAPED NOSE AND LIP  . Stroke [facility]) 2007  . Tobacco use     Patient Active Problem List   Diagnosis Date Noted  . Acute renal failure (ARF) ([facility]) 01/07/2018  . Seizure ([facility]) 08/14/2017  . Somatic dysfunction of spine, lumbar 12/11/2016  . Somatic dysfunction of spine, thoracic 12/11/2016  . Low back pain 11/27/2016  . Spasm of muscle of lower back 11/27/2016  . Mass involving both sides of chest wall   . Cyst of skin of breast 03/18/2016  . Right wrist pain 03/15/2016  . Eczema 05/16/2015  . Cocaine abuse in remission ([facility]) 08/22/2014  . Seizure disorder ([facility])  08/21/2014    Past Surgical History:  Procedure Laterality Date  . APPENDECTOMY  2000  . BACK SURGERY  2012  . BREAST CYST EXCISION Bilateral 05/29/2016   Procedure: MASS EXCISION CHEST WALL;  Surgeon: Ricarda Frame, MD;  Location: [facility] ORS;  Service: General;  Laterality: Bilateral;  . DILATION AND CURETTAGE OF UTERUS      Prior to Admission medications   Medication Sig Start Date End Date Taking? Authorizing Provider  aspirin EC 81 MG tablet Take 1 tablet by mouth daily. 10/15/17 10/15/18  [provider]  cephALEXin (KEFLEX) 500 MG capsule Take 1 capsule (500 mg total) by mouth 2 (two) times daily. 01/16/18   Loleta Rose, MD  cyclobenzaprine (FLEXERIL) 5 MG tablet Take 1 tablet (5 mg total) by mouth 3 (three) times daily as needed for muscle spasms. 01/16/18   Loleta Rose, MD  divalproex (DEPAKOTE) 500 MG DR tablet Take 1 tablet (500 mg total) by mouth every morning. 01/10/18 02/09/18  Ihor Austin, MD  divalproex (DEPAKOTE) 500 MG DR tablet Take 2 tablets (1,000 mg total) by mouth every evening. 01/09/18 02/08/18  Ihor Austin, MD  esomeprazole (NEXIUM) 40 MG capsule Take 1 capsule (40 mg total) by mouth daily. 02/04/18 02/04/19  Arnaldo Natal, MD  famotidine (PEPCID) 20 MG tablet Take 1 tablet (20 mg total) by mouth 2 (two) times daily for 7 days. 02/08/18 02/15/18  Joseph Art,  Dayna BarkerJaclyn M, PA-C  ibuprofen (ADVIL,MOTRIN) 600 MG tablet Take 1 tablet by mouth daily as needed. 01/06/18   [provider]  meloxicam (MOBIC) 7.5 MG tablet Take 1 tablet by mouth 2 (two) times daily. 12/12/17   [provider]  phenytoin (DILANTIN) 100 MG ER capsule Take 3 capsules (300 mg total) by mouth at bedtime. 08/15/17 01/07/18  Enid BaasKalisetti, Radhika, MD  predniSONE (DELTASONE) 50 MG tablet Take one 50 mg tablet once daily for the next five days. 02/08/18   Orvil FeilWoods, Cyara Devoto M, PA-C  QUEtiapine (SEROQUEL) 100 MG tablet Take 100 mg by mouth at bedtime.    [provider]  sucralfate  (CARAFATE) 1 GM/10ML suspension Take 10 mLs (1 g total) by mouth 4 (four) times daily. Take 2 hours before or after any other medication.  This medication can act like a sponge and absorb other medications and keep him from working unless it is taken by itself. 02/04/18 02/04/19  Arnaldo NatalMalinda, Paul F, MD  traZODone (DESYREL) 150 MG tablet Take 75 mg by mouth daily. 07/25/17   [provider]  triamcinolone (KENALOG) 0.025 % cream Apply 1 application topically 4 (four) times daily as needed. 12/31/17   [provider]  VENTOLIN HFA 108 (90 Base) MCG/ACT inhaler Inhale 2 puffs into the lungs as needed. 12/29/17   [provider]    Allergies Flagyl [metronidazole]  Family History  Problem Relation Age of Onset  . Epilepsy Mother   . Seizures Mother   . Heart disease Father   . Epilepsy Maternal Grandmother   . Heart attack Maternal Grandmother     Social History Social History   Tobacco Use  . Smoking status: Current Some Day Smoker    Packs/day: 0.25    Years: 47.00    Pack years: 11.75    Types: Cigarettes  . Smokeless tobacco: Never Used  . Tobacco comment: Interested in resources   Substance Use Topics  . Alcohol use: Not Currently    Alcohol/week: 49.0 standard drinks    Types: 49 Cans of beer per week  . Drug use: No    Types: Cocaine     Review of Systems  Constitutional: No fever/chills Eyes: No visual changes. No discharge ENT: Patient has dysphagia and pharyngitis.  Cardiovascular: no chest pain. Respiratory: no cough. No SOB. Gastrointestinal: No abdominal pain.  No nausea, no vomiting.  No diarrhea.  No constipation. Genitourinary: Negative for dysuria. No hematuria Musculoskeletal: Negative for musculoskeletal pain. Skin: Negative for rash, abrasions, lacerations, ecchymosis. Neurological: Negative for headaches, focal weakness or numbness.   ____________________________________________   PHYSICAL EXAM:  VITAL SIGNS: ED Triage  Vitals [02/08/18 1243]  Enc Vitals Group     BP 130/78     Pulse Rate (!) 116     Resp 16     Temp 98.3 F (36.8 C)     Temp Source Oral     SpO2 100 %     Weight 78 lb (35.4 kg)     Height 5' (1.524 m)     Head Circumference      Peak Flow      Pain Score 5     Pain Loc      Pain Edu?      Excl. in [place]?      Constitutional: Alert and oriented. Well appearing and in no acute distress. Eyes: Conjunctivae are normal. PERRL. EOMI. Head: Atraumatic. ENT:      Ears: TMs are pearly.  Nose: No congestion/rhinnorhea.      Mouth/Throat: Mucous membranes are moist.  Neck: No stridor.  No cervical spine tenderness to palpation. Hematological/Lymphatic/Immunilogical: No cervical lymphadenopathy. Cardiovascular: Normal rate, regular rhythm. Normal S1 and S2.  Good peripheral circulation. Respiratory: Normal respiratory effort without tachypnea or retractions. Lungs CTAB. Good air entry to the bases with no decreased or absent breath sounds. Gastrointestinal: Bowel sounds 4 quadrants. Soft and nontender to palpation. No guarding or rigidity. No palpable masses. No distention. No CVA tenderness. Musculoskeletal: Full range of motion to all extremities. No gross deformities appreciated. Neurologic:  Normal speech and language. No gross focal neurologic deficits are appreciated.  Skin:  Skin is warm, dry and intact. No rash noted. Psychiatric: Mood and affect are normal. Speech and behavior are normal. Patient exhibits appropriate insight and judgement.   ____________________________________________   LABS (all labs ordered are listed, but only abnormal results are displayed)  Labs Reviewed - No data to display ____________________________________________  EKG   ____________________________________________  RADIOLOGY   No results found.  ____________________________________________    PROCEDURES  Procedure(s) performed:    Procedures    Medications   dexamethasone (DECADRON) injection 10 mg (10 mg Intramuscular Given 02/08/18 1426)     ____________________________________________   INITIAL IMPRESSION / ASSESSMENT AND PLAN / ED COURSE  Pertinent labs & imaging results that were available during my care of the patient were reviewed by me and considered in my medical decision making (see chart for details).  Review of the [place] CSRS was performed in accordance of the [facility] prior to dispensing any controlled drugs.      Assessment and Plan: Dysphagia Pharyngitis Patient presents to the emergency department with dysphasia and pharyngitis.  Patient was seen for similar complaints on 02/04/2018 and was advised to follow-up with a barium swallow as patient has already had chest x-ray.  Patient was given an injection of Decadron in the emergency department and she reported that her symptoms improved some.  Patient was discharged with prednisone.  A referral to GI was given in her paperwork and patient instructions for follow-up were given.  Patient was observed tolerating fluids in the emergency department.  All patient questions were answered.    ____________________________________________  FINAL CLINICAL IMPRESSION(S) / ED DIAGNOSES  Final diagnoses:  Dysphagia, unspecified type      NEW MEDICATIONS STARTED DURING THIS VISIT:  ED Discharge Orders         Ordered    predniSONE (DELTASONE) 50 MG tablet     02/08/18 1424    famotidine (PEPCID) 20 MG tablet  2 times daily     02/08/18 1425              This chart was dictated using voice recognition software/Dragon. Despite best efforts to proofread, errors can occur which can change the meaning. Any change was purely unintentional.    Orvil Feil, PA-C 02/08/18 1639    Jene Every, MD 02/09/18 1054

## 2018-02-08 NOTE — ED Triage Notes (Signed)
Pt arrived via POV c/o of esophageal pain when she eats/drinks. Pt reports a burning sensation when swallowing. Pt states that she was just here a few days ago for the same problem and was given medication to take but she says that is is not helping. Pt NAD at present.

## 2018-02-23 ENCOUNTER — Ambulatory Visit (INDEPENDENT_AMBULATORY_CARE_PROVIDER_SITE_OTHER): Payer: Medicare Other | Admitting: Gastroenterology

## 2018-02-23 ENCOUNTER — Encounter: Payer: Self-pay | Admitting: Gastroenterology

## 2018-02-23 VITALS — BP 123/73 | HR 99 | Ht 65.0 in | Wt 91.8 lb

## 2018-02-23 DIAGNOSIS — R1310 Dysphagia, unspecified: Secondary | ICD-10-CM

## 2018-02-23 DIAGNOSIS — Z1211 Encounter for screening for malignant neoplasm of colon: Secondary | ICD-10-CM

## 2018-02-23 DIAGNOSIS — R1319 Other dysphagia: Secondary | ICD-10-CM

## 2018-02-23 NOTE — Progress Notes (Signed)
[name] [address]  [address]  [place], [place] [address]  Main: [phone]  Fax: [phone]   Gastroenterology Consultation  Referring Provider:     No ref. provider found Primary Care Physician:  [facility] Reason for Consultation:     Dysphagia        HPI:    Chief Complaint  Patient presents with  . New Patient (Initial Visit)    ED f/u dysphagia    [name] is a 53 y.o. y/o female referred for consultation & management  by Dr. Eli Phillipsenter, [facility].  Patient reports 1 to 2-week history of dysphagia to solids, and burning sensation when swallowing liquids.  She was given PPI, H2 RA and sucralfate by the ED and she states this does not help.  No prior history of similar symptoms.  I noticed that she does not have any teeth in her mouth today and I asked if she uses false teeth or dentures to chew and swallow.  She states her dentist is working on this.  She denies any episodes of food impaction.  She states she never has to bring food back up but does feel that it gets stuck in her mid chest and she has to swallow water.  No prior EGD or colonoscopy.    Past Medical History:  Diagnosis Date  . Acute renal failure (ARF) (HCC) 01/07/2018  . Allergy   . Anemia   . Arthritis   . Asthma    NO INHALERS  . Bipolar disorder (HCC)   . Cocaine abuse in remission (HCC)   . Depression   . Dyspnea   . Eczema   . Nervous   . Schizophrenia (HCC)   . Scoliosis   . Seizure (HCC)   . Seizures (HCC)    Last seizure, May 20, 2016-PT WAS ALONE-UNSURE LENGTH OF SEIZURE-SCRAPED NOSE AND LIP  . Stroke [facility](HCC) 2007  . Tobacco use     Past Surgical History:  Procedure Laterality Date  . APPENDECTOMY  2000  . BACK SURGERY  2012  . BREAST CYST EXCISION Bilateral 05/29/2016   Procedure: MASS EXCISION CHEST WALL;  Surgeon: Ricarda Frameharles Woodham, MD;  Location: [facility] ORS;  Service: General;  Laterality: Bilateral;  .  DILATION AND CURETTAGE OF UTERUS      Prior to Admission medications   Medication Sig Start Date End Date Taking? Authorizing Provider  aspirin EC 81 MG tablet Take 1 tablet by mouth daily. 10/15/17 10/15/18  [provider]  cephALEXin (KEFLEX) 500 MG capsule Take 1 capsule (500 mg total) by mouth 2 (two) times daily. 01/16/18   Loleta RoseForbach, Cory, MD  cyclobenzaprine (FLEXERIL) 5 MG tablet Take 1 tablet (5 mg total) by mouth 3 (three) times daily as needed for muscle spasms. 01/16/18   Loleta RoseForbach, Cory, MD  divalproex (DEPAKOTE) 500 MG DR tablet Take 1 tablet (500 mg total) by mouth every morning. 01/10/18 02/09/18  Ihor AustinPyreddy, Pavan, MD  divalproex (DEPAKOTE) 500 MG DR tablet Take 2 tablets (1,000 mg total) by mouth every evening. 01/09/18 02/08/18  Ihor AustinPyreddy, Pavan, MD  esomeprazole (NEXIUM) 40 MG capsule Take 1 capsule (40 mg total) by mouth daily. 02/04/18 02/04/19  Arnaldo NatalMalinda, Paul F, MD  famotidine (PEPCID) 20 MG tablet Take 1 tablet (20 mg total) by mouth 2 (two) times daily for 7 days. 02/08/18 02/15/18  Orvil FeilWoods, Jaclyn M, PA-C  ibuprofen (ADVIL,MOTRIN) 600 MG tablet Take 1 tablet by mouth daily as needed. 01/06/18  [provider]  meloxicam (MOBIC) 7.5 MG tablet Take 1 tablet by mouth 2 (two) times daily. 12/12/17   [provider]  phenytoin (DILANTIN) 100 MG ER capsule Take 3 capsules (300 mg total) by mouth at bedtime. 08/15/17 01/07/18  Enid Baas, MD  QUEtiapine (SEROQUEL) 100 MG tablet Take 100 mg by mouth at bedtime.    [provider]  sucralfate (CARAFATE) 1 GM/10ML suspension Take 10 mLs (1 g total) by mouth 4 (four) times daily. Take 2 hours before or after any other medication.  This medication can act like a sponge and absorb other medications and keep him from working unless it is taken by itself. 02/04/18 02/04/19  Arnaldo Natal, MD  triamcinolone (KENALOG) 0.025 % cream Apply 1 application topically 4 (four) times daily as needed. 12/31/17   [provider]  VENTOLIN HFA 108 (90 Base) MCG/ACT inhaler Inhale 2 puffs into the lungs as needed. 12/29/17   [provider]    Family History  Problem Relation Age of Onset  . Epilepsy Mother   . Seizures Mother   . Heart disease Father   . Epilepsy Maternal Grandmother   . Heart attack Maternal Grandmother      Social History   Tobacco Use  . Smoking status: Current Some Day Smoker    Packs/day: 0.25    Years: 47.00    Pack years: 11.75    Types: Cigarettes  . Smokeless tobacco: Never Used  . Tobacco comment: Interested in resources   Substance Use Topics  . Alcohol use: Not Currently    Alcohol/week: 0.0 standard drinks    Comment: 49 cans of beer weekly hx  . Drug use: No    Types: Cocaine    Allergies as of 02/23/2018 - Review Complete 02/23/2018  Allergen Reaction Noted  . Flagyl [metronidazole] Other (See Comments) 08/21/2014    Review of Systems:    All systems reviewed and negative except where noted in HPI.   Physical Exam:  BP 123/73   Pulse 99   Ht 5\' 5"  (1.651 m)   Wt 91 lb 12.8 oz (41.6 kg)   BMI 15.28 kg/m  No LMP recorded. Patient is postmenopausal. Psych:  Alert and cooperative. Normal mood and affect. General:   Alert,  Well-developed, well-nourished, pleasant and cooperative in NAD Head:  Normocephalic and atraumatic. Eyes:  Sclera clear, no icterus.   Conjunctiva pink. Ears:  Normal auditory acuity. Nose:  No deformity, discharge, or lesions. Mouth:  No deformity or lesions,oropharynx pink & moist. Neck:  Supple; no masses or thyromegaly. Abdomen:  Normal bowel sounds.  No bruits.  Soft, non-tender and non-distended without masses, hepatosplenomegaly or hernias noted.  No guarding or rebound tenderness.    Msk:  Symmetrical without gross deformities. Good, equal movement & strength bilaterally. Pulses:  Normal pulses noted. Extremities:  No clubbing or edema.  No cyanosis. Neurologic:  Alert and oriented x3;  grossly normal  neurologically. Skin:  Intact without significant lesions or rashes. No jaundice. Lymph Nodes:  No significant cervical adenopathy. Psych:  Alert and cooperative. Normal mood and affect.   Labs: CBC    Component Value Date/Time   WBC 12.9 (H) 02/04/2018 1159   RBC 3.74 (L) 02/04/2018 1159   HGB 11.2 (L) 02/04/2018 1159   HGB 13.5 05/22/2016 1114   HCT 33.2 (L) 02/04/2018 1159   HCT 40.3 05/22/2016 1114   PLT 440 (H) 02/04/2018 1159   PLT 156 12/14/2015 1900   MCV  88.8 02/04/2018 1159   MCV 94 05/22/2016 1114   MCV 95 05/05/2014 0147   MCH 29.9 02/04/2018 1159   MCHC 33.7 02/04/2018 1159   RDW 12.7 02/04/2018 1159   RDW 13.5 05/22/2016 1114   RDW 13.2 05/05/2014 0147   LYMPHSABS 1.7 01/15/2018 2340   LYMPHSABS 2.3 05/22/2016 1114   LYMPHSABS 2.7 07/18/2012 0332   MONOABS 1.3 (H) 01/15/2018 2340   MONOABS 0.3 07/18/2012 0332   EOSABS 0.0 01/15/2018 2340   EOSABS 0.1 05/22/2016 1114   EOSABS 0.1 07/18/2012 0332   BASOSABS 0.0 01/15/2018 2340   BASOSABS 0.0 05/22/2016 1114   BASOSABS 0.1 07/18/2012 0332   CMP     Component Value Date/Time   NA 133 (L) 02/04/2018 1159   NA 140 05/22/2016 1114   NA 140 05/05/2014 0147   K 3.9 02/04/2018 1159   K 3.7 05/05/2014 0147   CL 99 02/04/2018 1159   CL 109 05/05/2014 0147   CO2 23 02/04/2018 1159   CO2 24 05/05/2014 0147   GLUCOSE 125 (H) 02/04/2018 1159   GLUCOSE 93 05/05/2014 0147   BUN 6 02/04/2018 1159   BUN 6 05/22/2016 1114   BUN 9 05/05/2014 0147   CREATININE 0.51 02/04/2018 1159   CREATININE 0.56 05/05/2014 0147   CALCIUM 9.5 02/04/2018 1159   CALCIUM 9.1 05/05/2014 0147   PROT 8.4 (H) 02/04/2018 1159   PROT 7.2 05/22/2016 1114   PROT 7.6 07/18/2012 0332   ALBUMIN 3.2 (L) 02/04/2018 1159   ALBUMIN 4.7 05/22/2016 1114   ALBUMIN 3.8 07/18/2012 0332   AST 23 02/04/2018 1159   AST 70 (H) 07/18/2012 0332   ALT 11 02/04/2018 1159   ALT 50 07/18/2012 0332   ALKPHOS 74 02/04/2018 1159   ALKPHOS 81 07/18/2012  0332   BILITOT 0.8 02/04/2018 1159   BILITOT 0.4 05/22/2016 1114   BILITOT 0.3 07/18/2012 0332   GFRNONAA >60 02/04/2018 1159   GFRNONAA >60 05/05/2014 0147   GFRAA >60 02/04/2018 1159   GFRAA >60 05/05/2014 0147    Imaging Studies: Dg Chest 2 View  Result Date: 02/04/2018 CLINICAL DATA:  Patient with chest pain. EXAM: CHEST - 2 VIEW COMPARISON:  Chest radiograph 08/14/2017 FINDINGS: Normal cardiac and mediastinal contours. No consolidative pulmonary opacities. No pleural effusion or pneumothorax. Scoliotic curvature of the thoracolumbar spine. IMPRESSION: No acute cardiopulmonary process.  Left basilar atelectasis. Electronically Signed   By: Annia Belt M.D.   On: 02/04/2018 12:43    Assessment and Plan:   AMIRA BREARLEY is a 53 y.o. y/o female has been referred for dysphagia  EGD indicated to evaluate for etiology of dysphagia, strictures, narrowing, ring, malignancy The fact that she does not have any teeth is also likely contributing to her symptoms and I have asked her to follow-up with her dentist closely and get appropriate dentures or teeth as soon as possible.  I have asked her to eat a pured or dysphagia diet given that she does not have any teeth to avoid any food impactions.  We also discussed that she has never had a screening colonoscopy and this can be done at the same time as the EGD.  However, we did give her the option of doing the colonoscopy at a later time after the EGD findings and treat any source of dysphagia.  Patient states she does not have any trouble getting liquids down and can sip on her prep slowly throughout the day and will be able to completed.  She would rather have both procedures done at the same time.  I have discussed alternative options, risks & benefits,  which include, but are not limited to, bleeding, infection, perforation,respiratory complication & drug reaction.  The patient agrees with this plan & written consent will be obtained.    I  have also asked her to follow-up with her primary care provider closely and she verbalized understanding.  She has an upcoming appointment with them within the next 2 weeks.  Dr [name]  Speech recognition software was used to dictate the above note.

## 2018-02-23 NOTE — Addendum Note (Signed)
Addended by: Jackquline Denmark on: 02/23/2018 03:45 PM   Modules accepted: Orders

## 2018-02-23 NOTE — Addendum Note (Signed)
Addended by: Jackquline Denmark on: 02/23/2018 10:48 AM   Modules accepted: Orders, SmartSet

## 2018-02-23 NOTE — Patient Instructions (Signed)
Dysphagia Eating Plan, Pureed  This diet is helpful for people with moderate to severe swallowing problems. Pureed foods are smooth and are prepared without lumps so that they can be swallowed safely. Work with your health care provider and your diet and nutrition specialist (dietitian) to make sure that you are following the diet safely and getting all the nutrients you need.  What are tips for following this plan?  General instructions   You may eat foods that are soft and have a pudding-like texture.   Do not eat foods that you have to chew. If you have to chew the food, then you cannot eat it.   Avoid foods that are hard, dry, sticky, chunky, lumpy, or stringy. Also avoid foods with nuts, seeds, raisins, skins, or pulp.   You may be instructed to thicken liquids. Follow your health care provider's instructions about how to do this and to what consistency.  Cooking     If a food is not originally a smooth texture, you may be able to eat the food after:  ? Pureeing it. This can be done with a blender.  ? Moistening it. This can be done by adding juice, cooking liquid, gravy, or sauce to a dry food and then pureeing it. For example, you may have bread if you soak it in milk and puree it.   If a food is too thin, you may add a commercial thickener, corn starch, rice cereal, or potato flakes to thicken it.   Strain and throw away any liquid that separates from a solid pureed food before eating.   Strain lumps, chunks, pulp, and seeds from pureed foods before eating.   Reheat foods slowly to prevent a tough crust from forming.  Meal planning   Eat a variety of foods to get all the nutrients you need.   Add dry milk or protein powder to food to increase calories and protein content.   Follow your meal plan as told by your dietitian.  What foods are allowed?  The items listed may not be a complete list. Talk with your dietitian about what dietary choices are best for you.  Grains  Soft breads, pancakes,  French toast, muffins, and bread stuffing pureed to a smooth, moist texture, without nuts or seeds. Cooked cereals that have a pudding-like consistency, such as cream of wheat or farina. Pureed oatmeal. Pureed, well-cooked pasta and rice.  Vegetables  Pureed vegetables. Smooth tomato paste or sauce. Mashed or pureed potatoes without skin.  Fruits  Pureed fruits such as melons and apples without seeds or pulp. Mashed bananas. Mashed avocado. Fruit juices without pulp or seeds.  Meats and other protein foods  Pureed meat, poultry, and fish. Smooth pate or liverwurst. Smooth souffles. Pureed beans (such as lentils). Pureed eggs. Smooth nut and seed butters. Pureed tofu.  Dairy  Yogurt. Milk. Pureed cottage cheese. Nutritional dairy drinks or shakes. Cream cheese. Smooth pudding, ice cream, sherbet, and malts.  Fats and oils  Butter. Margarine. Vegetable oils. Smooth and strained gravy. Sour cream. Mayonnaise. Smooth sauces such as white sauce, cheese sauce, or hollandaise sauce.  Sweets and desserts  Moistened and pureed cookies and cakes. Whipped topping. Gelatin. Pudding pops.  Seasoning and other foods  Finely ground spices. Jelly. Honey. Pureed casseroles. Strained soups. Pureed sandwiches.  Beverages  Anything prepared at the consistency recommended by your dietitian.  What foods are not allowed?  The items listed may not be a complete list. Talk with your dietitian about   what dietary choices are best for you.  Grains  Oatmeal. Dry cereals. Hard breads. Breads with seeds or nuts. Whole pasta, rice, or other grains. Whole pancakes, waffles, biscuits, muffins, or rolls.  Vegetables  Whole vegetables. Stringy vegetables (such as celery). Tomatoes or tomato sauce with seeds. Fried vegetables.  Fruits  Whole fresh, frozen, canned, or dried fruits that have not been pureed. Stringy fruits, such as pineapple or coconut. Watermelon with seeds. Dried fruit or fruit leather.  Meat and other protein foods  Whole or ground  meat, fish, or poultry. Dried or cooked lentils or legumes that have been cooked but not mashed or pureed. Non-pureed eggs. Nuts and seeds. Crunchy peanut butter. Whole tofu or other meat alternatives.  Dairy  Cheese cubes or slices. Non-pureed cottage cheese. Yogurt with fruit chunks.  Fats and oils  All fats and sauces that have lumps or chunks.  Sweets and desserts  Solid desserts. Sticky, chewy sweets (such as licorice and caramel). Candy with nuts or coconut.  Seasoning and other foods  Coarse or seeded herbs and spices. Chunky preserves. Jams with seeds. Whole sandwiches. Non-pureed casseroles. Chunky soups.  Summary   Pureed foods can be helpful for people with moderate to severe swallowing problems.   On the dysphagia eating plan, you may eat foods that are soft and have a pudding-like texture. You should avoid foods that you have to chew. If you have to chew the food, then you cannot eat it.   You may be instructed to thicken liquids. Follow your health care provider's instructions about how to do this and to what consistency.  This information is not intended to replace advice given to you by your health care provider. Make sure you discuss any questions you have with your health care provider.  Document Released: 01/21/2005 Document Revised: 03/26/2016 Document Reviewed: 03/26/2016  Elsevier Interactive Patient Education  2019 Elsevier Inc.

## 2018-03-03 ENCOUNTER — Telehealth: Payer: Self-pay | Admitting: Gastroenterology

## 2018-03-03 NOTE — Telephone Encounter (Signed)
Patient would like to go over the prep for her colonoscopy for 03-04-18 with Dr Maximino Greenland. Please note per patient she has eaten breakfast.

## 2018-03-03 NOTE — Telephone Encounter (Signed)
Colonoscopy has been canceled with Trish in Endo.  Referral noted.   Thanks [employer]

## 2018-03-03 NOTE — Telephone Encounter (Signed)
Patient called to cancel her colonoscopy on 03-25-18 with Dr Maximino Greenland. She has an appointment in [facility] & will call back to r/s.

## 2018-03-03 NOTE — Telephone Encounter (Signed)
Returned patients call.  She did not understand her colonoscopy instructions.  She ate breakfast this am.  I reviewed the instructions with her in detail.  She has been rescheduled for her procedures on 03/25/18.  Her procedures for tomorrow have been canceled.  Thanks [employer]

## 2018-03-09 ENCOUNTER — Ambulatory Visit: Payer: Medicare Other | Admitting: Gastroenterology

## 2018-03-11 ENCOUNTER — Ambulatory Visit (INDEPENDENT_AMBULATORY_CARE_PROVIDER_SITE_OTHER): Payer: Medicare Other | Admitting: Nurse Practitioner

## 2018-03-11 ENCOUNTER — Encounter: Payer: Self-pay | Admitting: Nurse Practitioner

## 2018-03-11 ENCOUNTER — Ambulatory Visit: Payer: Self-pay | Admitting: Nurse Practitioner

## 2018-03-11 ENCOUNTER — Other Ambulatory Visit: Payer: Self-pay

## 2018-03-11 VITALS — BP 111/79 | HR 110 | Temp 98.5°F | Resp 17 | Ht 60.0 in | Wt 88.8 lb

## 2018-03-11 DIAGNOSIS — B354 Tinea corporis: Secondary | ICD-10-CM

## 2018-03-11 DIAGNOSIS — F1411 Cocaine abuse, in remission: Secondary | ICD-10-CM

## 2018-03-11 DIAGNOSIS — G8929 Other chronic pain: Secondary | ICD-10-CM

## 2018-03-11 DIAGNOSIS — Z7689 Persons encountering health services in other specified circumstances: Secondary | ICD-10-CM

## 2018-03-11 DIAGNOSIS — M545 Low back pain: Secondary | ICD-10-CM | POA: Diagnosis not present

## 2018-03-11 DIAGNOSIS — M62830 Muscle spasm of back: Secondary | ICD-10-CM

## 2018-03-11 DIAGNOSIS — L308 Other specified dermatitis: Secondary | ICD-10-CM

## 2018-03-11 DIAGNOSIS — G40909 Epilepsy, unspecified, not intractable, without status epilepticus: Secondary | ICD-10-CM

## 2018-03-11 MED ORDER — CLOTRIMAZOLE-BETAMETHASONE 1-0.05 % EX CREA: 1.0000 "application " | TOPICAL_CREAM | Freq: Two times a day (BID) | CUTANEOUS | 0 refills | Status: DC

## 2018-03-11 MED ORDER — TIZANIDINE HCL 4 MG PO TABS: 4.0000 mg | ORAL_TABLET | Freq: Four times a day (QID) | ORAL | 1 refills | Status: DC | PRN

## 2018-03-11 NOTE — Patient Instructions (Addendum)
[name],   Thank you for coming in to clinic today.  1. STOP cyclobenzaprine.  2. START tizanidine 4 mg one pill every 8 hours as needed.  3. Referral placed to [facility] - they will call you - [facility] Address: [address] [place], [place], [place] [address] Phone: [phone]  4. Recommend reconnecting with psychiatry.  Call one of these locations to schedule an appointment for your bipolar and schizophrenia. - [facility]) [place] [address], [place], [place] [address] Phone: [phone]  - [facility], available walk-in 9am-4pm M-F [address] [place], [place] [address] Hours: 9am - 4pm (M-F, walk in available) [phone]  4. For your rash, use lotrisone cream twice daily for 14 days.  Please schedule a follow-up appointment with Wilhelmina Mcardle, AGNP. Return in about 3 months (around 06/09/2018) for back pain, seizure followup.  If you have any other questions or concerns, please feel free to call the clinic or send a message through MyChart. You may also schedule an earlier appointment if necessary.  You will receive a survey after today's visit either digitally by e-mail or paper by [employer]. Your experiences and feedback matter to [place].  Please respond so we know how we are doing as we provide care for you.   Wilhelmina Mcardle, DNP, AGNP-BC Adult Gerontology Nurse Practitioner [facility]  Low Back Pain Exercises See other page with pictures of each exercise.  Start with 1 or 2 of these exercises that you are most comfortable with. Do not do any exercises that cause you significant worsening pain. Some of these may cause some "stretching soreness" but it should go away after you stop the exercise, and get better over time. Gradually increase up to 3-4 exercises as tolerated.  Standing hamstring stretch: Place the heel of your leg on a stool about 15 inches high. Keep  your knee straight. Lean forward, bending at the hips until you feel a mild stretch in the back of your thigh. Make sure you do not roll your shoulders and bend at the waist when doing this or you will stretch your lower back instead. Hold the stretch for 15 to 30 seconds. Repeat 3 times. Repeat the same stretch on your other leg.  Cat and camel: Get down on your hands and knees. Let your stomach sag, allowing your back to curve downward. Hold this position for 5 seconds. Then arch your back and hold for 5 seconds. Do 3 sets of 10.  Quadriped Arm/Leg Raises: Get down on your hands and knees. Tighten your abdominal muscles to stiffen your spine. While keeping your abdominals tight, raise one arm and the opposite leg away from you. Hold this position for 5 seconds. Lower your arm and leg slowly and alternate sides. Do this 10 times on each side.  Pelvic tilt: Lie on your back with your knees bent and your feet flat on the floor. Tighten your abdominal muscles and push your lower back into the floor. Hold this position for 5 seconds, then relax. Do 3 sets of 10.  Partial curl: Lie on your back with your knees bent and your feet flat on the floor. Tighten your stomach muscles and flatten your back against the floor. Tuck your chin to your chest. With your hands stretched out in front of you, curl your upper body forward until your shoulders clear the floor. Hold this position for 3 seconds. Don't hold your breath. It  helps to breathe out as you lift your shoulders up. Relax. Repeat 10 times. Build to 3 sets of 10. To challenge yourself, clasp your hands behind your head and keep your elbows out to the side.  Lower trunk rotation: Lie on your back with your knees bent and your feet flat on the floor. Tighten your abdominal muscles and push your lower back into the floor. Keeping your shoulders down flat, gently rotate your legs to one side, then the other as far as you can. Repeat 10 to 20 times.  Single  knee to chest stretch: Lie on your back with your legs straight out in front of you. Bring one knee up to your chest and grasp the back of your thigh. Pull your knee toward your chest, stretching your buttock muscle. Hold this position for 15 to 30 seconds and return to the starting position. Repeat 3 times on each side.  Double knee to chest: Lie on your back with your knees bent and your feet flat on the floor. Tighten your abdominal muscles and push your lower back into the floor. Pull both knees up to your chest. Hold for 5 seconds and repeat 10 to 20 times.

## 2018-03-11 NOTE — Progress Notes (Signed)
Subjective:    Patient ID: [name], female    DOB: [birth year], 53 y.o.   MRN: [id number]  [name] is a 53 y.o. female presenting on 03/11/2018 for Establish Care (chronic lower back pain, diagnose w/ muscle spasm and scolosis)   HPI Establish Care New Provider Pt last seen by PCP [facility] (Dr. Allena Katz) 1-2 months ago.  Obtain records from [facility].   Patient uses [facility] for bubble packs and delivery.  Chronic low back pain Patient presents with chronic lumbar back pain with muscle spasms and scoliosis.  Has used percocet in past that "worked really well."  Back pain is limiting mobility.  Feels like her muscles are squeezing/pulling.  Pain is preventing sleep. - Feels cyclobenzaprine is not helping, gabapentin didn't help pain in past. - Patient presents to clinic using a walker that appears brand new with manufacturer's tags still hanging on it in new condition.  After visit, patient was viewed walking throughout the waiting room without walker and did not appear to be in severe pain. - Patient states today she is willing to consider cortisone shots if they might help.  Seizure disorder Patient is currently managed on depakote and dilantin for seizure prevention. Has had seizures since 53 years old and is getting better at recognizing them before they start so she can call someone to help her (usually EMS).   - Last seizure was about 3 months ago.  Patient estimates she may have 16-17 seizures a year.    - Last neurology visit about 5 months ago - [place] [facility] is where patient reports she was last seen Dot Lanes, NP).  Rash  Patient has had pruritic, pustular rash appear over the last 1 week. Has had no product changes that she remembers.  Scratching has caused mild excoriation.  Patient has documented history of eczema in chart reviewed after visit. Patient denied having eczema when being examined.  Bipolar disorder/Schizophrenia - Patient was seeing a  psychiatrist, but patient stopped medication and has decided she is "[place] pray about it."  "It is not as bad as it used to be." Patient was taking seroquel and felt it was too strong.  She states she does like going to talk to someone and is willing to get reconnected.  History of stroke with residual effects: Patient reports she had a stroke in 2007.  Left arm weakness, some slurred speech persist.  Patient notes slurred speech is more common during seizure.   Past Medical History:  Diagnosis Date  . Acute renal failure (ARF) (HCC) 01/07/2018  . Allergy   . Anemia   . Arthritis   . Asthma    NO INHALERS  . Bipolar disorder (HCC)   . Cocaine abuse in remission (HCC)   . Depression   . Dyspnea   . Eczema   . Nervous   . Schizophrenia (HCC)   . Scoliosis   . Seizure (HCC)   . Seizures (HCC)    Last seizure, May 20, 2016-PT WAS ALONE-UNSURE LENGTH OF SEIZURE-SCRAPED NOSE AND LIP  . Stroke [facility]) 2007  . Tobacco use    Past Surgical History:  Procedure Laterality Date  . APPENDECTOMY  2000  . BACK SURGERY  2012  . BREAST CYST EXCISION Bilateral 05/29/2016   Procedure: MASS EXCISION CHEST WALL;  Surgeon: Ricarda Frame, MD;  Location: [facility] ORS;  Service: General;  Laterality: Bilateral;  . DILATION AND CURETTAGE OF UTERUS     Social History  Socioeconomic History  . Marital status: Single    Spouse name: Not on file  . Number of children: Not on file  . Years of education: Not on file  . Highest education level: Not on file  Occupational History  . Not on file  Social Needs  . Financial resource strain: Not on file  . Food insecurity:    Worry: Not on file    Inability: Not on file  . Transportation needs:    Medical: Not on file    Non-medical: Not on file  Tobacco Use  . Smoking status: Current Some Day Smoker    Packs/day: 0.50    Years: 47.00    Pack years: 23.50    Types: Cigarettes  . Smokeless tobacco: Never Used  . Tobacco comment: Interested in  resources   Substance and Sexual Activity  . Alcohol use: Not Currently    Alcohol/week: 0.0 standard drinks    Comment: 49 cans of beer weekly hx  . Drug use: No    Types: Cocaine  . Sexual activity: Never    Birth control/protection: None    Comment: no pregnancy prevention, but no menses,   Lifestyle  . Physical activity:    Days per week: Not on file    Minutes per session: Not on file  . Stress: Not on file  Relationships  . Social connections:    Talks on phone: Not on file    Gets together: Not on file    Attends religious service: Not on file    Active member of club or organization: Not on file    Attends meetings of clubs or organizations: Not on file    Relationship status: Not on file  . Intimate partner violence:    Fear of current or ex partner: Not on file    Emotionally abused: Not on file    Physically abused: Not on file    Forced sexual activity: Not on file  Other Topics Concern  . Not on file  Social History Narrative  . Not on file   Family History  Problem Relation Age of Onset  . Epilepsy Mother   . Seizures Mother   . Heart disease Father   . Epilepsy Maternal Grandmother   . Heart attack Maternal Grandmother    Current Outpatient Medications on File Prior to Visit  Medication Sig  . aspirin EC 81 MG tablet Take 1 tablet by mouth daily.  . divalproex (DEPAKOTE) 500 MG DR tablet Take 1 tab in the morning and 2 tabs at night  . phenytoin (DILANTIN) 100 MG ER capsule Take 200 mg by mouth at bedtime.   . famotidine (PEPCID) 20 MG tablet Take 1 tablet (20 mg total) by mouth 2 (two) times daily for 7 days.  . VENTOLIN HFA 108 (90 Base) MCG/ACT inhaler Inhale 2 puffs into the lungs as needed.   No current facility-administered medications on file prior to visit.     Review of Systems  Constitutional: Negative for activity change, appetite change, fatigue and unexpected weight change.  HENT: Negative for congestion, sore throat and voice change.    Eyes: Negative for pain and discharge.  Respiratory: Negative for apnea, cough, shortness of breath and wheezing.   Cardiovascular: Negative for chest pain, palpitations and leg swelling.  Gastrointestinal: Negative for abdominal pain, constipation, diarrhea, nausea and vomiting.  Endocrine: Negative for cold intolerance, heat intolerance, polydipsia, polyphagia and polyuria.  Genitourinary: Negative for difficulty urinating and menstrual problem.  Musculoskeletal:  Positive for arthralgias, back pain and myalgias.  Skin: Positive for rash.  Neurological: Positive for seizures, speech difficulty and weakness. Negative for dizziness, light-headedness and headaches.  Hematological: Does not bruise/bleed easily.  Psychiatric/Behavioral: Positive for dysphoric mood and sleep disturbance. Negative for self-injury and suicidal ideas.   Per HPI unless specifically indicated above     Objective:    BP 111/79 (BP Location: Right Arm, Patient Position: Sitting, Cuff Size: Small)   Pulse (!) 110   Temp 98.5 F (36.9 C) (Oral)   Resp 17   Ht 5' (1.524 m)   Wt 88 lb 12.8 oz (40.3 kg)   SpO2 99%   BMI 17.34 kg/m   Wt Readings from Last 3 Encounters:  03/11/18 88 lb 12.8 oz (40.3 kg)  02/23/18 91 lb 12.8 oz (41.6 kg)  02/08/18 78 lb (35.4 kg)    Physical Exam Vitals signs reviewed.  Constitutional:      General: She is not in acute distress.    Appearance: She is well-developed.  HENT:     Head: Normocephalic and atraumatic.     Mouth/Throat:     Dentition: Abnormal dentition (pt without teeth or dentures).  Cardiovascular:     Rate and Rhythm: Normal rate and regular rhythm.     Pulses:          Radial pulses are 2+ on the right side and 2+ on the left side.       Posterior tibial pulses are 1+ on the right side and 1+ on the left side.     Heart sounds: Normal heart sounds, S1 normal and S2 normal.  Pulmonary:     Effort: Pulmonary effort is normal. No respiratory distress.      Breath sounds: Normal breath sounds and air entry.  Musculoskeletal:     Right lower leg: No edema.     Left lower leg: No edema.     Comments: Scoliosis with left-side curvature.  Patient with muscle spasms of paraspinal muscles bilaterally.  Patient with significant bony tenderness of lumbar spine near level of L4-S1. Patient becomes tearful with light pressure  Skin:    General: Skin is warm and dry.     Capillary Refill: Capillary refill takes less than 2 seconds.       Neurological:     Mental Status: She is alert and oriented to person, place, and time.  Psychiatric:        Attention and Perception: Attention normal.        Mood and Affect: Mood and affect normal.        Behavior: Behavior normal. Behavior is cooperative.        Thought Content: Thought content normal.        Judgment: Judgment normal.    Results for orders placed or performed during the hospital encounter of 02/04/18  Basic metabolic panel  Result Value Ref Range   Sodium 133 (L) 135 - 145 mmol/L   Potassium 3.9 3.5 - 5.1 mmol/L   Chloride 99 98 - 111 mmol/L   CO2 23 22 - 32 mmol/L   Glucose, Bld 125 (H) 70 - 99 mg/dL   BUN 6 6 - 20 mg/dL   Creatinine, Ser 8.11 0.44 - 1.00 mg/dL   Calcium 9.5 8.9 - 91.4 mg/dL   GFR calc non Af Amer >60 >60 mL/min   GFR calc Af Amer >60 >60 mL/min   Anion gap 11 5 - 15  CBC  Result Value Ref Range  WBC 12.9 (H) 4.0 - 10.5 K/uL   RBC 3.74 (L) 3.87 - 5.11 MIL/uL   Hemoglobin 11.2 (L) 12.0 - 15.0 g/dL   HCT 34.7 (L) 42.5 - 95.6 %   MCV 88.8 80.0 - 100.0 fL   MCH 29.9 26.0 - 34.0 pg   MCHC 33.7 30.0 - 36.0 g/dL   RDW 38.7 56.4 - 33.2 %   Platelets 440 (H) 150 - 400 K/uL   nRBC 0.0 0.0 - 0.2 %  Troponin I - ONCE - STAT  Result Value Ref Range   Troponin I <0.03 <0.03 ng/mL  Hepatic function panel  Result Value Ref Range   Total Protein 8.4 (H) 6.5 - 8.1 g/dL   Albumin 3.2 (L) 3.5 - 5.0 g/dL   AST 23 15 - 41 U/L   ALT 11 0 - 44 U/L   Alkaline Phosphatase 74 38  - 126 U/L   Total Bilirubin 0.8 0.3 - 1.2 mg/dL   Bilirubin, Direct 0.2 0.0 - 0.2 mg/dL   Indirect Bilirubin 0.6 0.3 - 0.9 mg/dL      Assessment & Plan:   Problem List Items Addressed This Visit      Nervous and Auditory   Seizure disorder (HCC) Stable without seizure in last 3 months per patient report.    Plan: 1. Labs for drug levels considered today.  Patient states labs were collected 1-2 months ago for drug levels at [facility]  Has appointment with neurology in 1 month. 2. Continue follow-up with neurology as scheduled.  Also recommend patient to continue psychiatry as recommended by neurology for mood disorder. 3. Follow-up 3 months and sooner prn.   Relevant Medications   divalproex (DEPAKOTE) 500 MG DR tablet   phenytoin (DILANTIN) 100 MG ER capsule     Musculoskeletal and Integument   Eczema Currently uncontrolled.  Likely complicated by mild tinea corporis infection.   START lotrisone cream bid x 14 days.  Can transition to steroid only cream after this if rash persists for primary eczema treatment.  Follow-up prn and 3 months.     Other   Cocaine abuse in remission [facility]) Patient reports last use was "years ago" and has no desire to resume. Cardiac exam normal today except tachycardia. -  Encouraged patient to continue to maintain sobriety. - Continue regular monitoring of cardiac health for possible late complication of cocaine use.    Low back pain   Relevant Medications   tiZANidine (ZANAFLEX) 4 MG tablet   Spasm of muscle of lower back Chronic low back pain complicated by scoliosis and poor patient mobility.  Patient cites multiple medication failures in past including gabapentin, Flexeril.  Plan: 1. Discussed with patient that opioid prescribing for back pain treatment is not recommended by literature and is not proven to be effective. 2. STOP cyclobenzaprine 3. START tizanidine 4 mg one tablet tid prn spasm.  Provided only #60 and instructed patient that  she will not get #90 per 30 days at this time. 4. Offered pain management referral and patient very willing at today's visit to consider injections for possible pain relief.  Again encouraged patient to seek psychiatric care prior to appointment for pain management. 5. Follow-up prn.  Avoid all opioid use given history of cocaine use.      Other Visit Diagnoses    Tinea corporis    -  Primary See eczema above   Relevant Medications   clotrimazole-betamethasone (LOTRISONE) cream   Encounter to establish care  Previous PCP was at [facility].  Records will be requested.  Past medical, family, and surgical history reviewed w/ patient in clinic today.       Meds ordered this encounter  Medications  . clotrimazole-betamethasone (LOTRISONE) cream    Sig: Apply 1 application topically 2 (two) times daily for 14 days.    Dispense:  30 g    Refill:  0    Order Specific Question:   Supervising Provider    Answer:   Smitty Cords [[id number]]  . tiZANidine (ZANAFLEX) 4 MG tablet    Sig: Take 1 tablet (4 mg total) by mouth every 6 (six) hours as needed for muscle spasms.    Dispense:  60 tablet    Refill:  1    Order Specific Question:   Supervising Provider    Answer:   Smitty Cords [[id number]]   Follow up plan: Return in about 3 months (around 06/09/2018) for back pain, seizure followup.  Wilhelmina Mcardle, DNP, AGPCNP-BC Adult Gerontology Primary Care Nurse Practitioner [facility] 03/11/2018, 3:08 PM

## 2018-03-16 ENCOUNTER — Telehealth: Payer: Self-pay | Admitting: Nurse Practitioner

## 2018-03-16 DIAGNOSIS — M9902 Segmental and somatic dysfunction of thoracic region: Secondary | ICD-10-CM

## 2018-03-16 DIAGNOSIS — M545 Low back pain, unspecified: Secondary | ICD-10-CM

## 2018-03-16 DIAGNOSIS — M9903 Segmental and somatic dysfunction of lumbar region: Secondary | ICD-10-CM

## 2018-03-16 DIAGNOSIS — G8929 Other chronic pain: Secondary | ICD-10-CM

## 2018-03-16 NOTE — Telephone Encounter (Signed)
We discussed many things at her visit last time.  I must have missed placing the referral order.  It is now placed.  See OV 03/11/2018 for OV note.

## 2018-03-16 NOTE — Telephone Encounter (Signed)
Pt called to check status of referral to pain management.  Her number is [phone]

## 2018-03-16 NOTE — Telephone Encounter (Signed)
Incoming call

## 2018-03-20 ENCOUNTER — Telehealth: Payer: Self-pay | Admitting: Nurse Practitioner

## 2018-03-20 DIAGNOSIS — F1411 Cocaine abuse, in remission: Secondary | ICD-10-CM

## 2018-03-20 DIAGNOSIS — M9902 Segmental and somatic dysfunction of thoracic region: Secondary | ICD-10-CM

## 2018-03-20 DIAGNOSIS — M545 Low back pain, unspecified: Secondary | ICD-10-CM

## 2018-03-20 DIAGNOSIS — G8929 Other chronic pain: Secondary | ICD-10-CM

## 2018-03-20 DIAGNOSIS — M9903 Segmental and somatic dysfunction of lumbar region: Secondary | ICD-10-CM

## 2018-03-20 NOTE — Telephone Encounter (Signed)
Per referral notes, at time of scheduling appointment with pain management patient declined procedural pain management.  [facility] is unable to add her to medication management only.   - Patient will not be added to any chronic medication management from this clinic either due to history of cocaine use in remission.  - Patient may try Dr. Ronita Hipps pain management if desired.  New referral can be placed.  [facility] [address]  [address]  [place], [place] [address]  [phone]

## 2018-03-20 NOTE — Telephone Encounter (Signed)
Incoming call

## 2018-03-20 NOTE — Telephone Encounter (Signed)
Pt does not want injection for back.  She wants medication [phone]

## 2018-03-20 NOTE — Telephone Encounter (Signed)
The pt was notified and would like to proceed with the referral.

## 2018-03-25 ENCOUNTER — Encounter: Admission: RE | Payer: Self-pay | Source: Home / Self Care

## 2018-03-25 ENCOUNTER — Ambulatory Visit: Admission: RE | Admit: 2018-03-25 | Payer: Medicare Other | Source: Home / Self Care | Admitting: Gastroenterology

## 2018-03-25 SURGERY — COLONOSCOPY WITH PROPOFOL
Anesthesia: General

## 2018-04-01 ENCOUNTER — Telehealth: Payer: Self-pay | Admitting: Nurse Practitioner

## 2018-04-01 NOTE — Telephone Encounter (Signed)
The pt called to f/u on her previous message concerning her back [name]. I  gave the patient [name] phone number to contact and f/u on her referral.

## 2018-04-01 NOTE — Telephone Encounter (Signed)
Pt said someone was going to get back with her about her back pain [phone]

## 2018-04-10 ENCOUNTER — Telehealth: Payer: Self-pay | Admitting: Family Medicine

## 2018-04-10 NOTE — Telephone Encounter (Signed)
Pt called [facility] about getting an appt and they told her they had not received fax for referral.  Her call back number is [phone]

## 2018-04-29 ENCOUNTER — Emergency Department
Admission: EM | Admit: 2018-04-29 | Discharge: 2018-04-29 | Disposition: A | Discharge status: Home / Self Care | Payer: Medicare Other | Attending: Emergency Medicine | Admitting: Emergency Medicine

## 2018-04-29 ENCOUNTER — Other Ambulatory Visit: Payer: Self-pay

## 2018-04-29 DIAGNOSIS — Z7982 Long term (current) use of aspirin: Secondary | ICD-10-CM | POA: Insufficient documentation

## 2018-04-29 DIAGNOSIS — R569 Unspecified convulsions: Secondary | ICD-10-CM | POA: Diagnosis not present

## 2018-04-29 DIAGNOSIS — J45909 Unspecified asthma, uncomplicated: Secondary | ICD-10-CM | POA: Diagnosis not present

## 2018-04-29 DIAGNOSIS — F17210 Nicotine dependence, cigarettes, uncomplicated: Secondary | ICD-10-CM | POA: Insufficient documentation

## 2018-04-29 DIAGNOSIS — F319 Bipolar disorder, unspecified: Secondary | ICD-10-CM | POA: Diagnosis not present

## 2018-04-29 DIAGNOSIS — Z9114 Patient's other noncompliance with medication regimen: Secondary | ICD-10-CM

## 2018-04-29 LAB — CBC WITH DIFFERENTIAL/PLATELET
Abs Immature Granulocytes: 0.03 K/uL (ref 0.00–0.07)
Basophils Absolute: 0 K/uL (ref 0.0–0.1)
Basophils Relative: 0 %
Eosinophils Absolute: 0 K/uL (ref 0.0–0.5)
Eosinophils Relative: 0 %
HCT: 50.2 % — ABNORMAL HIGH (ref 36.0–46.0)
Hemoglobin: 17.5 g/dL — ABNORMAL HIGH (ref 12.0–15.0)
Immature Granulocytes: 0 %
Lymphocytes Relative: 29 %
Lymphs Abs: 3.1 K/uL (ref 0.7–4.0)
MCH: 31.2 pg (ref 26.0–34.0)
MCHC: 34.9 g/dL (ref 30.0–36.0)
MCV: 89.5 fL (ref 80.0–100.0)
Monocytes Absolute: 0.6 K/uL (ref 0.1–1.0)
Monocytes Relative: 6 %
Neutro Abs: 7.1 K/uL (ref 1.7–7.7)
Neutrophils Relative %: 65 %
Platelets: 210 K/uL (ref 150–400)
RBC: 5.61 MIL/uL — ABNORMAL HIGH (ref 3.87–5.11)
RDW: 14.1 % (ref 11.5–15.5)
WBC: 10.9 K/uL — ABNORMAL HIGH (ref 4.0–10.5)
nRBC: 0 % (ref 0.0–0.2)

## 2018-04-29 LAB — BASIC METABOLIC PANEL
Anion gap: 12 (ref 5–15)
BUN: <5 mg/dL — ABNORMAL LOW (ref 6–20)
CO2: 21 mmol/L — ABNORMAL LOW (ref 22–32)
CREATININE: 0.51 mg/dL (ref 0.44–1.00)
Calcium: 9.1 mg/dL (ref 8.9–10.3)
Chloride: 106 mmol/L (ref 98–111)
GFR calc Af Amer: >60 mL/min (ref >60)
GFR calc non Af Amer: >60 mL/min (ref >60)
Glucose, Bld: 90 mg/dL (ref 70–99)
Potassium: 3.7 mmol/L (ref 3.5–5.1)
Sodium: 139 mmol/L (ref 135–145)

## 2018-04-29 MED ORDER — DIVALPROEX SODIUM 500 MG PO DR TAB
500.0000 mg | DELAYED_RELEASE_TABLET | Freq: Once | ORAL | Status: AC
  Administered 2018-04-29: 500 mg via ORAL
  Filled 2018-04-29: qty 1

## 2018-04-29 MED ORDER — SODIUM CHLORIDE 0.9 % IV SOLN
1000.0000 mg | Freq: Once | INTRAVENOUS | Status: AC
  Administered 2018-04-29: 1000 mg via INTRAVENOUS
  Filled 2018-04-29: qty 20

## 2018-04-29 MED ORDER — PHENYTOIN SODIUM EXTENDED 100 MG PO CAPS: 200.0000 mg | ORAL_CAPSULE | Freq: Every day | ORAL | 0 refills | Status: DC

## 2018-04-29 MED ORDER — SODIUM CHLORIDE 0.9 % IV BOLUS
1000.0000 mL | Freq: Once | INTRAVENOUS | Status: AC
  Administered 2018-04-29: 1000 mL via INTRAVENOUS

## 2018-04-29 MED ORDER — DIVALPROEX SODIUM 500 MG PO DR TAB: DELAYED_RELEASE_TABLET | ORAL | 0 refills | Status: DC

## 2018-04-29 MED ORDER — PHENYTOIN SODIUM EXTENDED 100 MG PO CAPS
300.0000 mg | ORAL_CAPSULE | Freq: Once | ORAL | Status: AC
  Administered 2018-04-29: 300 mg via ORAL
  Filled 2018-04-29: qty 3

## 2018-04-29 NOTE — ED Notes (Signed)
Cast shoe and ace wrap applied to left foot per dr Scotty Court verbal order.  Pt was yelling ot requesting a boot or wrap.  md aware.  This rn applied shoe and wrap.  D/c inst to pt.

## 2018-04-29 NOTE — ED Notes (Signed)
Pt states she has not had any seizures in past 5 months, states that her granddaughter passed away this morning and she believes this initiated her symptoms/seizures

## 2018-04-29 NOTE — ED Triage Notes (Addendum)
Pt arrives via ems from boarding house located at [address]. Ems reports pt experienced two back to seizures at home. Ems states pt normally takes seizure medications but pt repots not taking any for 2-3 months Pt a&o on arrival. Pt states she was going to have a seizure, then became tense, shaking and rolling onto right side. MD present at this time.

## 2018-04-29 NOTE — Discharge Instructions (Signed)
Take your medicine every day to prevent seizures.  If it is too expensive to get your medication from [facility], you can call the [facility] for more affordable medicines.

## 2018-04-29 NOTE — ED Provider Notes (Signed)
[facility] Emergency Department Provider Note  ____________________________________________  Time seen: Approximately 2:11 PM  I have reviewed the triage vital signs and the nursing notes.   HISTORY  Chief Complaint Seizures    HPI [name] is a 53 y.o. female with a history of bipolar disorder, polysubstance abuse, seizures who complains of having a seizure today.  She reports that she has not taken her Depakote or Dilantin in 2 months because it is too expensive.  She reports that when [facility] gets [facility], she has to pay $3 for her prescriptions which she cannot afford.  Since she has not paid them the $3 from her last prescription, they will not provide her any refills.  She denies any recent falls or serious acute injuries, no recent illness or pain syndrome.  She does complain of pain in her right fifth toe from stubbing it.      Past Medical History:  Diagnosis Date  . Acute renal failure (ARF) (HCC) 01/07/2018  . Allergy   . Anemia   . Arthritis   . Asthma    NO INHALERS  . Bipolar disorder (HCC)   . Cocaine abuse in remission (HCC)   . Depression   . Dyspnea   . Eczema   . Nervous   . Schizophrenia (HCC)   . Scoliosis   . Seizure (HCC)   . Seizures (HCC)    Last seizure, May 20, 2016-PT WAS ALONE-UNSURE LENGTH OF SEIZURE-SCRAPED NOSE AND LIP  . Stroke [facility]) 2007  . Tobacco use      Patient Active Problem List   Diagnosis Date Noted  . Acute renal failure (ARF) (HCC) 01/07/2018  . Mood disorder (HCC) 07/14/2017  . Somatic dysfunction of spine, lumbar 12/11/2016  . Somatic dysfunction of spine, thoracic 12/11/2016  . Low back pain 11/27/2016  . Spasm of muscle of lower back 11/27/2016  . Cyst of skin of breast 03/18/2016  . Eczema 05/16/2015  . Cocaine abuse in remission (HCC) 08/22/2014  . Seizure disorder (HCC) 08/21/2014     Past Surgical History:  Procedure Laterality Date  . APPENDECTOMY  2000   . BACK SURGERY  2012  . BREAST CYST EXCISION Bilateral 05/29/2016   Procedure: MASS EXCISION CHEST WALL;  Surgeon: Ricarda Frame, MD;  Location: [facility] ORS;  Service: General;  Laterality: Bilateral;  . DILATION AND CURETTAGE OF UTERUS       Prior to Admission medications   Medication Sig Start Date End Date Taking? Authorizing Provider  aspirin EC 81 MG tablet Take 1 tablet by mouth daily. 10/15/17 10/15/18 Yes [provider]  QUEtiapine (SEROQUEL) 50 MG tablet Take 50 mg by mouth at bedtime.   Yes [provider]  divalproex (DEPAKOTE) 500 MG DR tablet Take 1 tab in the morning and 2 tabs at night 04/29/18   Sharman Cheek, MD  phenytoin (DILANTIN) 100 MG ER capsule Take 2 capsules (200 mg total) by mouth at bedtime. 04/29/18 06/30/19  Sharman Cheek, MD     Allergies Flagyl [metronidazole]   Family History  Problem Relation Age of Onset  . Epilepsy Mother   . Seizures Mother   . Heart disease Father   . Epilepsy Maternal Grandmother   . Heart attack Maternal Grandmother     Social History Social History   Tobacco Use  . Smoking status: Current Some Day Smoker    Packs/day: 0.50    Years: 47.00    Pack years: 23.50  Types: Cigarettes  . Smokeless tobacco: Never Used  . Tobacco comment: Interested in resources   Substance Use Topics  . Alcohol use: Not Currently    Alcohol/week: 0.0 standard drinks    Comment: 49 cans of beer weekly hx  . Drug use: No    Types: Cocaine    Review of Systems  Constitutional:   No fever or chills.  ENT:   No sore throat. No rhinorrhea. Cardiovascular:   No chest pain or syncope. Respiratory:   No dyspnea or cough. Gastrointestinal:   Negative for abdominal pain, vomiting and diarrhea.  Musculoskeletal: Right fifth toe pain All other systems reviewed and are negative except as documented above in ROS and HPI.  ____________________________________________   PHYSICAL EXAM:  VITAL SIGNS: ED Triage  Vitals  Enc Vitals Group     BP 04/29/18 1205 (!) 139/99     Pulse Rate 04/29/18 1205 (!) 116     Resp 04/29/18 1230 16     Temp 04/29/18 1205 98.2 F (36.8 C)     Temp Source 04/29/18 1205 Oral     SpO2 04/29/18 1205 100 %     Weight 04/29/18 1158 88 lb (39.9 kg)     Height 04/29/18 1158 5' (1.524 m)     Head Circumference --      Peak Flow --      Pain Score 04/29/18 1205 5     Pain Loc --      Pain Edu? --      Excl. in GC? --     Vital signs reviewed, nursing assessments reviewed.   Constitutional:   Alert and oriented. Non-toxic appearance. Eyes:   Conjunctivae are normal. EOMI. PERRL. ENT      Head:   Normocephalic and atraumatic.      Nose:   No congestion/rhinnorhea.       Mouth/Throat:   MMM, no pharyngeal erythema. No peritonsillar mass.  No tongue or intraoral injury      Neck:   No meningismus. Full ROM. Hematological/Lymphatic/Immunilogical:   No cervical lymphadenopathy. Cardiovascular:   RRR. Symmetric bilateral radial and DP pulses.  No murmurs. Cap refill less than 2 seconds. Respiratory:   Normal respiratory effort without tachypnea/retractions. Breath sounds are clear and equal bilaterally. No wheezes/rales/rhonchi. Gastrointestinal:   Soft and nontender. Non distended. There is no CVA tenderness.  No rebound, rigidity, or guarding.  Musculoskeletal:   Normal range of motion in all extremities. No joint effusions.  No lower extremity tenderness.  No edema. Neurologic:   Normal speech and language.  Motor grossly intact. No acute focal neurologic deficits are appreciated.  During my exam patient had an episode of apparent seizure activity which lasted approximately 20 seconds and resolved on its own, characterized by tensing the whole body, turning on her right side, and then proceeding to forcefully rhythmically suck on her right fist.  The patient then did seem confused for a minute or 2 afterward before returning to baseline. Skin:    Skin is warm, dry and  intact. No rash noted.  No petechiae, purpura, or bullae.  ____________________________________________    LABS (pertinent positives/negatives) (all labs ordered are listed, but only abnormal results are displayed) Labs Reviewed  CBC WITH DIFFERENTIAL/PLATELET - Abnormal; Notable for the following components:      Result Value   WBC 10.9 (*)    RBC 5.61 (*)    Hemoglobin 17.5 (*)    HCT 50.2 (*)    All other components within  normal limits  BASIC METABOLIC PANEL - Abnormal; Notable for the following components:   CO2 21 (*)    BUN <5 (*)    All other components within normal limits   ____________________________________________   EKG  Interpreted by me Sinus tachycardia rate 112, normal axis and intervals.  Poor R wave progression.  Normal ST segments and T waves.  ____________________________________________    RADIOLOGY  No results found.  ____________________________________________   PROCEDURES Procedures  ____________________________________________    CLINICAL IMPRESSION / ASSESSMENT AND PLAN / ED COURSE  Medications ordered in the ED: Medications  phenytoin (DILANTIN) ER capsule 300 mg (has no administration in time range)  phenytoin (DILANTIN) 1,000 mg in sodium chloride 0.9 % 250 mL IVPB (0 mg Intravenous Stopped 04/29/18 1400)  sodium chloride 0.9 % bolus 1,000 mL (0 mLs Intravenous Stopped 04/29/18 1400)  divalproex (DEPAKOTE) DR tablet 500 mg (500 mg Oral Given 04/29/18 1241)    Pertinent labs & imaging results that were available during my care of the patient were reviewed by me and considered in my medical decision making (see chart for details).    Patient presents with recurrent seizure today, highly likely due to medication noncompliance.  No evidence of recent illness, no trauma or head injury.  She is tachycardic which I think is due to dehydration.  Labs show hemoconcentration with a hemoglobin of 17, otherwise unremarkable.  Patient ordered  for a gram of IV phenytoin.  She received about 80% of this before she remove the IV herself.  She was also given 500 mg of Depakote in the ED.  I have written prescriptions to refill her medicines and referred her to the medication management clinic.  No recurrent seizures since giving her antiepileptics, appears to be stabilized.  Doubt meningitis encephalitis intracranial hemorrhage toxic ingestion or electrolyte abnormality.  Doubt sepsis or infection.      ____________________________________________   FINAL CLINICAL IMPRESSION(S) / ED DIAGNOSES    Final diagnoses:  Seizure (HCC)  Hx of medication noncompliance     ED Discharge Orders         Ordered    divalproex (DEPAKOTE) 500 MG DR tablet     04/29/18 1408    phenytoin (DILANTIN) 100 MG ER capsule  Daily at bedtime     04/29/18 1408          Portions of this note were generated with dragon dictation software. Dictation errors may occur despite best attempts at proofreading.   Sharman Cheek, MD 04/29/18 [phone]

## 2018-05-05 ENCOUNTER — Telehealth: Payer: Self-pay

## 2018-05-05 NOTE — Telephone Encounter (Signed)
The pt called to requesting to make a hospital f/u appt for a broken toe on her right foot. X 6 days ago. After looking at her discharge summary, no xray was done to confirm the broken toe. Please advise

## 2018-05-05 NOTE — Telephone Encounter (Signed)
The pt was notified. She verbalize understanding, appt scheduled for April 7th with her PCP.

## 2018-05-05 NOTE — Telephone Encounter (Signed)
Covering inbox for [name], AGPCNP-BC while she is out of office.  Reviewed chart, patient was seen in ED on 04/29/18 at [facility], she was not admitted to the hospital.  She was seen for seizures. She also had the additional complaint of stubbing her 5th toe on R foot. There was no X-ray completed in the ED and no further management of the toe as advised by ED doctors to the patient at that time.  If she is concerned about broken toe, due to persistent pain and or bruising, if she can still ambulate on it without difficulty usually this is something that can be treated without orthopedic involvement. Sometimes we do not always do X-ray of injury to small toes.  I would recommend she schedule a Virtual Visit ED Follow-up with PCP, [name], AGPCNP-BC when available within 1 week - due to nature of her complaint of Seizures from ED.  If she cannot wait - then she could go to an Urgent Care specifically for her toe injury to have X-ray done immediately if need further treatment advice.  She should use ice packs on toe and avoid excessive walking on it. She can use firm shoe support to avoid bending her toe.  Upon discussion with Lauren, can decide if she would order her to go get an x-ray or not, again if it is improving, may not need X-ray.  Saralyn Pilar, DO [facility] 05/05/2018, 12:13 PM

## 2018-05-12 ENCOUNTER — Ambulatory Visit (INDEPENDENT_AMBULATORY_CARE_PROVIDER_SITE_OTHER): Payer: Self-pay | Admitting: Nurse Practitioner

## 2018-05-12 ENCOUNTER — Other Ambulatory Visit: Payer: Self-pay

## 2018-05-12 DIAGNOSIS — Z09 Encounter for follow-up examination after completed treatment for conditions other than malignant neoplasm: Secondary | ICD-10-CM

## 2018-05-27 ENCOUNTER — Ambulatory Visit: Payer: Medicare Other | Admitting: Gastroenterology

## 2018-06-03 NOTE — Progress Notes (Signed)
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 the ulnar aspect of the RIGHT hand at the level of the proximal second metacarpal.  No acute fracture, dislocation or bone destruction.  IMPRESSION: No acute osseous abnormalities.   Electronically Signed   By: Lavonia Dana M.D.   On: 10/26/2015 16:56     Complexity Note: Imaging results reviewed. Results shared with Ms. Winkels, using Layman's terms.                         [place]  Drug: [name]  reports no history of drug use. Alcohol:  reports previous alcohol use. Tobacco:  reports that she has been smoking cigarettes. She has a 23.50 pack-year smoking history. She has never used smokeless tobacco. Medical:  has a past medical history of Acute renal failure (ARF) ([place]) (01/07/2018),  Allergy, Anemia, Arthritis, Asthma, Bipolar disorder ([place]), Cocaine abuse in remission ([place]), Depression, Dyspnea, Eczema, Nervous, Schizophrenia ([place]), Scoliosis, Seizure ([place]), Seizures ([place]), Stroke ([place]) (2007), and Tobacco use. Family: family history includes Epilepsy in her maternal grandmother and mother; Heart attack in her maternal grandmother; Heart disease in her father; Seizures in her mother.  Past Surgical History:  Procedure Laterality Date  . APPENDECTOMY  2000  . BREAST CYST EXCISION Bilateral 05/29/2016   Procedure: MASS EXCISION CHEST WALL;  Surgeon: Clayburn Pert, MD;  Location: [facility] ORS;  Service: General;  Laterality: Bilateral;  . DILATION AND CURETTAGE OF UTERUS     Active Ambulatory Problems    Diagnosis Date Noted  . Seizure disorder ([place]) 08/21/2014  . Cocaine abuse in remission ([place]) 08/22/2014  . Eczema 05/16/2015  . Cyst of skin of breast 03/18/2016  . Low back pain 11/27/2016  . Spasm of muscle of lower back 11/27/2016  . Somatic dysfunction of spine, lumbar 12/11/2016  . Somatic dysfunction of spine, thoracic 12/11/2016  . Acute renal failure (ARF) ([place]) 01/07/2018  . Bipolar disorder ([place]) 07/14/2017  . Lumbar radicular pain (left) 06/10/2018  . History of cocaine abuse ([place]) 06/10/2018  . Polysubstance abuse ([place]) 06/10/2018   Resolved Ambulatory Problems    Diagnosis Date Noted  . Right wrist pain 03/15/2016  . Mass involving both sides of chest wall 08/26/2017   Past Medical History:  Diagnosis Date  . Allergy   . Anemia   . Arthritis   . Asthma   . Depression   . Dyspnea   . Nervous   . Schizophrenia ([place])   . Scoliosis   . Seizure ([place])   . Seizures ([place])   . Stroke [facility]) 2007  . Tobacco use    Assessment  Primary Diagnosis & Pertinent Problem List: The primary encounter diagnosis was Lumbar radicular pain (left). Diagnoses of Chronic bilateral low back pain with left-sided sciatica, Chronic pain syndrome, History of cocaine  abuse ([place]), Polysubstance abuse ([place]), Seizure disorder ([place]), Bipolar affective disorder, remission status unspecified ([place]), and Other intervertebral disc degeneration, lumbar region were also pertinent to this visit.  Visit Diagnosis (New problems to examiner): 1. Lumbar radicular pain (left)   2. Chronic bilateral low back pain with left-sided sciatica   3. Chronic pain syndrome   4. History of cocaine abuse ([place])   5. Polysubstance abuse ([place])   6. Seizure disorder ([place])   7. Bipolar affective disorder, remission status unspecified ([place])   8. Other intervertebral disc degeneration, lumbar region    Chronic  axial low back pain with radiation into left lower extremity.  This seems to be radicular in nature.  Unable to conduct physical exam as this is a virtual visit but patient's pain has been debilitating for her and making it difficult for her to perform ADLs.  She has tried physical therapy in the past and we discussed aquatic therapy in the future.  In regards to imaging studies, she only has a lumbar spine x-ray which shows severe levoscoliosis and degenerative changes at L3-L4.  Given worsening pain in the context of having failed conservative NSAID therapy and physical therapy, recommend MRI.  Pending MRI results, patient could be a candidate for lumbar epidural steroid injection; interlaminar versus transforaminal.  In regards to medication management, patient will not be a candidate for opioid therapy given prior substance abuse with cocaine with multiple urine drug screens positive for it.  Furthermore the patient has tried various NSAIDs and does not find much benefit from them.  In regards to membrane stabilizers and neuropathic agents such as gabapentin, Cymbalta, Lyrica, I do not feel comfortable prescribing her these in the context of her psychiatric history which includes bipolar disorder and schizophrenia managed on Depakote, Dilantin, Seroquel.  Patient has not been compliant with  psychotropic therapy in the past.  We will focus primarily on interventional pain management and consideration of referral to aquatic therapy in the future.  Patient in agreement with plan.  I informed the patient that after lumbar MRI results, I will call her to discuss treatment plan.   Imaging Orders     MR LUMBAR SPINE WO CONTRAST Referral Orders  No referral(s) requested today   Interventional management options: [name] was informed that there is no guarantee that she would be a candidate for interventional therapies. The decision will be based on the results of diagnostic studies, as well as [name]'s risk profile.  Procedure(s) under consideration:  Lumbar epidural steroid injection   Provider-requested follow-up: Return for After Imaging.  Future Appointments  Date Time Provider [place]  07/16/2018  1:30 PM Virgel Manifold, MD [facility] None    Total duration of non-face-to-face encounter: 30 minutes.  Primary Care Physician: Mikey College, NP Location: [facility] Note by: Gillis Santa, MD Date: 06/10/2018; Time: 10:15 AM

## 2018-06-09 ENCOUNTER — Encounter: Payer: Self-pay | Admitting: Student in an Organized Health Care Education/Training Program

## 2018-06-10 ENCOUNTER — Ambulatory Visit
Payer: Medicare Other | Attending: Student in an Organized Health Care Education/Training Program | Admitting: Student in an Organized Health Care Education/Training Program

## 2018-06-10 ENCOUNTER — Other Ambulatory Visit: Payer: Self-pay

## 2018-06-10 DIAGNOSIS — F319 Bipolar disorder, unspecified: Secondary | ICD-10-CM

## 2018-06-10 DIAGNOSIS — G894 Chronic pain syndrome: Secondary | ICD-10-CM | POA: Diagnosis not present

## 2018-06-10 DIAGNOSIS — M5442 Lumbago with sciatica, left side: Secondary | ICD-10-CM

## 2018-06-10 DIAGNOSIS — M5416 Radiculopathy, lumbar region: Secondary | ICD-10-CM

## 2018-06-10 DIAGNOSIS — F1910 Other psychoactive substance abuse, uncomplicated: Secondary | ICD-10-CM

## 2018-06-10 DIAGNOSIS — F1411 Cocaine abuse, in remission: Secondary | ICD-10-CM | POA: Diagnosis not present

## 2018-06-10 DIAGNOSIS — G8929 Other chronic pain: Secondary | ICD-10-CM

## 2018-06-10 DIAGNOSIS — G40909 Epilepsy, unspecified, not intractable, without status epilepticus: Secondary | ICD-10-CM

## 2018-06-10 DIAGNOSIS — M5136 Other intervertebral disc degeneration, lumbar region: Secondary | ICD-10-CM

## 2018-06-10 DIAGNOSIS — M51369 Other intervertebral disc degeneration, lumbar region without mention of lumbar back pain or lower extremity pain: Secondary | ICD-10-CM

## 2018-07-16 ENCOUNTER — Encounter: Payer: Self-pay | Admitting: *Deleted

## 2018-07-16 ENCOUNTER — Telehealth: Payer: Self-pay | Admitting: *Deleted

## 2018-07-16 ENCOUNTER — Ambulatory Visit: Payer: Medicare Other | Admitting: Gastroenterology

## 2018-07-16 DIAGNOSIS — Z87891 Personal history of nicotine dependence: Secondary | ICD-10-CM

## 2018-07-16 DIAGNOSIS — Z122 Encounter for screening for malignant neoplasm of respiratory organs: Secondary | ICD-10-CM

## 2018-07-16 NOTE — Telephone Encounter (Signed)
Received referral for initial lung cancer screening scan. Contacted patient and obtained smoking history,(current, 58.5 pack year) as well as answering questions related to screening process. Patient denies signs of lung cancer such as weight loss or hemoptysis. Patient denies comorbidity that would prevent curative treatment if lung cancer were found. Patient is scheduled for shared decision making visit and CT scan on 07/20/18 at 1115am.

## 2018-07-17 ENCOUNTER — Other Ambulatory Visit: Payer: Self-pay

## 2018-07-20 ENCOUNTER — Other Ambulatory Visit: Payer: Self-pay

## 2018-07-20 ENCOUNTER — Inpatient Hospital Stay: Payer: Medicare Other | Attending: Nurse Practitioner | Admitting: Oncology

## 2018-07-20 ENCOUNTER — Telehealth: Payer: Self-pay | Admitting: *Deleted

## 2018-07-20 ENCOUNTER — Ambulatory Visit
Admission: RE | Admit: 2018-07-20 | Discharge: 2018-07-20 | Disposition: A | Discharge status: Home / Self Care | Payer: Medicare Other | Source: Ambulatory Visit | Attending: Nurse Practitioner | Admitting: Nurse Practitioner

## 2018-07-20 DIAGNOSIS — Z87891 Personal history of nicotine dependence: Secondary | ICD-10-CM | POA: Diagnosis present

## 2018-07-20 DIAGNOSIS — F17210 Nicotine dependence, cigarettes, uncomplicated: Secondary | ICD-10-CM

## 2018-07-20 DIAGNOSIS — Z122 Encounter for screening for malignant neoplasm of respiratory organs: Secondary | ICD-10-CM

## 2018-07-20 NOTE — Progress Notes (Signed)
Virtual Visit via Video Note  I connected with@ on 07/20/18 at 11:15 AM EDT by a video enabled telemedicine application and verified that I am speaking with the correct person using two identifiers.  Location: Patient: [facility]   Provider:Home   I discussed the limitations of evaluation and management by telemedicine and the availability of in person appointments. The patient expressed understanding and agreed to proceed.  I discussed the assessment and treatment plan with the patient. The patient was provided an opportunity to ask questions and all were answered. The patient agreed with the plan and demonstrated an understanding of the instructions.   The patient was advised to call back or seek an in-person evaluation if the symptoms worsen or if the condition fails to improve as anticipated.   In accordance with CMS guidelines, patient has met eligibility criteria including age, absence of signs or symptoms of lung cancer.  Social History   Tobacco Use  . Smoking status: Current Every Day Smoker    Packs/day: 1.50    Years: 39.00    Pack years: 58.50    Types: Cigarettes  . Smokeless tobacco: Never Used  . Tobacco comment: Interested in resources   Substance Use Topics  . Alcohol use: Not Currently    Alcohol/week: 0.0 standard drinks    Comment: 49 cans of beer weekly hx  . Drug use: No    Types: Cocaine      A shared decision-making session was conducted prior to the performance of CT scan. This includes one or more decision aids, includes benefits and harms of screening, follow-up diagnostic testing, over-diagnosis, false positive rate, and total radiation exposure.   Counseling on the importance of adherence to annual lung cancer LDCT screening, impact of co-morbidities, and ability or willingness to undergo diagnosis and treatment is imperative for compliance of the program.   Counseling on the importance of continued smoking cessation for former smokers; the importance of  smoking cessation for current smokers, and information about tobacco cessation interventions have been given to patient including [place] and 1800 quit [place] programs.   Written order for lung cancer screening with LDCT has been given to the patient and any and all questions have been answered to the best of my abilities.    Yearly follow up will be coordinated by Burgess Estelle, Thoracic Navigator.  I provided 10 minutes of face-to-face video visit time during this encounter, and > 50% was spent counseling as documented under my assessment & plan.   Jacquelin Hawking, NP

## 2018-07-21 ENCOUNTER — Telehealth: Payer: Self-pay | Admitting: *Deleted

## 2018-07-21 NOTE — Telephone Encounter (Signed)
Notified patient of LDCT lung cancer screening program results with recommendation for 12 month follow up imaging. Also notified of incidental findings noted below and is encouraged to discuss further with PCP who will receive a copy of this note and/or the CT report. Patient verbalizes understanding.   IMPRESSION: 1. Lung-RADS 2, benign appearance or behavior. Continue annual screening with low-dose chest CT without contrast in 12 months. 2.  Emphysema. (ICD10-J43.9) 3.  Aortic Atherosclerois (ICD10-170.0)   

## 2018-08-06 ENCOUNTER — Other Ambulatory Visit: Payer: Self-pay

## 2018-08-06 ENCOUNTER — Ambulatory Visit
Admission: RE | Admit: 2018-08-06 | Discharge: 2018-08-06 | Disposition: A | Discharge status: Home / Self Care | Payer: Medicare Other | Source: Ambulatory Visit | Attending: Student in an Organized Health Care Education/Training Program | Admitting: Student in an Organized Health Care Education/Training Program

## 2018-08-06 DIAGNOSIS — M5136 Other intervertebral disc degeneration, lumbar region: Secondary | ICD-10-CM | POA: Diagnosis not present

## 2018-08-11 ENCOUNTER — Other Ambulatory Visit: Payer: Self-pay | Admitting: Nurse Practitioner

## 2018-08-11 DIAGNOSIS — B354 Tinea corporis: Secondary | ICD-10-CM

## 2018-08-28 ENCOUNTER — Emergency Department
Admission: EM | Admit: 2018-08-28 | Discharge: 2018-08-28 | Disposition: A | Discharge status: Home / Self Care | Payer: Medicare Other | Attending: Emergency Medicine | Admitting: Emergency Medicine

## 2018-08-28 ENCOUNTER — Encounter: Payer: Self-pay | Admitting: Emergency Medicine

## 2018-08-28 ENCOUNTER — Other Ambulatory Visit: Payer: Self-pay

## 2018-08-28 DIAGNOSIS — Z7982 Long term (current) use of aspirin: Secondary | ICD-10-CM | POA: Insufficient documentation

## 2018-08-28 DIAGNOSIS — F17210 Nicotine dependence, cigarettes, uncomplicated: Secondary | ICD-10-CM | POA: Insufficient documentation

## 2018-08-28 DIAGNOSIS — B029 Zoster without complications: Secondary | ICD-10-CM | POA: Diagnosis not present

## 2018-08-28 DIAGNOSIS — J45909 Unspecified asthma, uncomplicated: Secondary | ICD-10-CM | POA: Diagnosis not present

## 2018-08-28 DIAGNOSIS — R21 Rash and other nonspecific skin eruption: Secondary | ICD-10-CM | POA: Diagnosis present

## 2018-08-28 MED ORDER — GABAPENTIN 100 MG PO CAPS
100.0000 mg | ORAL_CAPSULE | Freq: Once | ORAL | Status: AC
  Administered 2018-08-28: 100 mg via ORAL
  Filled 2018-08-28: qty 1

## 2018-08-28 MED ORDER — VALACYCLOVIR HCL 1 G PO TABS: 2000.0000 mg | ORAL_TABLET | Freq: Three times a day (TID) | ORAL | 0 refills | Status: AC

## 2018-08-28 MED ORDER — GABAPENTIN 100 MG PO CAPS: 100.0000 mg | ORAL_CAPSULE | Freq: Three times a day (TID) | ORAL | 0 refills | Status: DC

## 2018-08-28 MED ORDER — VALACYCLOVIR HCL 500 MG PO TABS
1000.0000 mg | ORAL_TABLET | Freq: Once | ORAL | Status: AC
  Administered 2018-08-28: 1000 mg via ORAL
  Filled 2018-08-28: qty 2

## 2018-08-28 NOTE — ED Provider Notes (Signed)
[facility] Emergency Department Provider Note  ____________________________________________  Time seen: Approximately 2:37 AM  I have reviewed the triage vital signs and the nursing notes.   HISTORY  Chief Complaint Rash   HPI [name] is a 53 y.o. female with a history of cocaine abuse, schizophrenia, CVA, bipolar disorder who presents for evaluation of a rash.  Patient reports 2 days of a rash involving her left leg and buttock region.  The rash is painful and it burns, constant and nonradiating.  No fever or chills.  No contact exposures.  No involvement of oral mucous membranes, no fever.   Past Medical History:  Diagnosis Date  . Acute renal failure (ARF) (HCC) 01/07/2018  . Allergy   . Anemia   . Arthritis   . Asthma    NO INHALERS  . Bipolar disorder (HCC)   . Cocaine abuse in remission (H[facility])   . Depression   . Dyspnea   . Eczema   . Nervous   . Schizophrenia (HCC)   . Scoliosis   . Seizure (H[facility])   . Seizures (H[facility])    Last seizure, May 20, 2016-PT WAS ALONE-UNSURE LENGTH OF SEIZURE-SCRAPED NOSE AND LIP  . Stroke [facility](HCC) 2007  . Tobacco use     Patient Active Problem List   Diagnosis Date Noted  . Lumbar radicular pain (left) 06/10/2018  . History of cocaine abuse (HCC) 06/10/2018  . Polysubstance abuse (H[facility]) 06/10/2018  . Acute renal failure (ARF) (HCC) 01/07/2018  . Bipolar disorder (HCC) 07/14/2017  . Somatic dysfunction of spine, lumbar 12/11/2016  . Somatic dysfunction of spine, thoracic 12/11/2016  . Low back pain 11/27/2016  . Spasm of muscle of lower back 11/27/2016  . Cyst of skin of breast 03/18/2016  . Eczema 05/16/2015  . Cocaine abuse in remission (HCC) 08/22/2014  . Seizure disorder (HCC) 08/21/2014    Past Surgical History:  Procedure Laterality Date  . APPENDECTOMY  2000  . BREAST CYST EXCISION Bilateral 05/29/2016   Procedure: MASS EXCISION CHEST WALL;  Surgeon: Ricarda Frameharles Woodham, MD;  Location: [facility]  ORS;  Service: General;  Laterality: Bilateral;  . DILATION AND CURETTAGE OF UTERUS      Prior to Admission medications   Medication Sig Start Date End Date Taking? Authorizing Provider  aspirin EC 81 MG tablet Take 1 tablet by mouth daily. 10/15/17 10/15/18  [provider]  clotrimazole-betamethasone (LOTRISONE) cream APPLY TOPICALLY TWICE DAILY FOR 14 DAYS 08/11/18   Althea CharonKaramalegos, Netta NeatAlexander J, DO  divalproex (DEPAKOTE) 500 MG DR tablet Take 1 tab in the morning and 2 tabs at night 04/29/18   Sharman CheekStafford, Phillip, MD  gabapentin (NEURONTIN) 100 MG capsule Take 1 capsule (100 mg total) by mouth 3 (three) times daily. 08/28/18 08/28/19  Nita SickleVeronese, [place], MD  phenytoin (DILANTIN) 100 MG ER capsule Take 2 capsules (200 mg total) by mouth at bedtime. 04/29/18 06/30/19  Sharman CheekStafford, Phillip, MD  QUEtiapine (SEROQUEL) 50 MG tablet Take 50 mg by mouth at bedtime.    [provider]  valACYclovir (VALTREX) 1000 MG tablet Take 2 tablets (2,000 mg total) by mouth 3 (three) times daily for 10 days. 08/28/18 09/07/18  Nita SickleVeronese, [place], MD    Allergies Flagyl [metronidazole]  Family History  Problem Relation Age of Onset  . Epilepsy Mother   . Seizures Mother   . Heart disease Father   . Epilepsy Maternal Grandmother   . Heart attack Maternal Grandmother     Social History Social History  Tobacco Use  . Smoking status: Current Every Day Smoker    Packs/day: 1.50    Years: 39.00    Pack years: 58.50    Types: Cigarettes  . Smokeless tobacco: Never Used  . Tobacco comment: Interested in resources   Substance Use Topics  . Alcohol use: Not Currently    Alcohol/week: 0.0 standard drinks    Comment: 49 cans of beer weekly hx  . Drug use: No    Types: Cocaine    Review of Systems  Constitutional: Negative for fever. Eyes: Negative for visual changes. ENT: Negative for sore throat. Neck: No neck pain  Cardiovascular: Negative for chest pain. Respiratory: Negative for shortness  of breath. Gastrointestinal: Negative for abdominal pain, vomiting or diarrhea. Genitourinary: Negative for dysuria. Musculoskeletal: Negative for back pain. Skin: + rash. Neurological: Negative for headaches, weakness or numbness. Psych: No SI or HI  ____________________________________________   PHYSICAL EXAM:  VITAL SIGNS: ED Triage Vitals  Enc Vitals Group     BP 08/28/18 0124 121/64     Pulse Rate 08/28/18 0124 (!) 104     Resp 08/28/18 0124 17     Temp 08/28/18 0124 99.6 F (37.6 C)     Temp Source 08/28/18 0124 Oral     SpO2 08/28/18 0124 98 %     Weight 08/28/18 0126 102 lb (46.3 kg)     Height 08/28/18 0126 5' (1.524 m)     Head Circumference --      Peak Flow --      Pain Score 08/28/18 0126 0     Pain Loc --      Pain Edu? --      Excl. in [place]? --     Constitutional: Alert and oriented. Well appearing and in no apparent distress. HEENT:      Head: Normocephalic and atraumatic.         Eyes: Conjunctivae are normal. Sclera is non-icteric.       Mouth/Throat: Mucous membranes are moist.       Neck: Supple with no signs of meningismus. Cardiovascular: Regular rate and rhythm. No murmurs, gallops, or rubs. 2+ symmetrical distal pulses are present in all extremities. No JVD. Respiratory: Normal respiratory effort. Lungs are clear to auscultation bilaterally. No wheezes, crackles, or rhonchi.  Gastrointestinal: Soft, non tender, and non distended with positive bowel sounds. No rebound or guarding. Musculoskeletal: Nontender with normal range of motion in all extremities. No edema, cyanosis, or erythema of extremities. Neurologic: Normal speech and language. Face is symmetric. Moving all extremities. No gross focal neurologic deficits are appreciated. Skin: Skin is warm, dry and intact. Vesicular and papular rash involving the L thigh, L buttock, and L lower back, no crepitus, no abscess Psychiatric: Mood and affect are normal. Speech and behavior are normal.   ____________________________________________   LABS (all labs ordered are listed, but only abnormal results are displayed)  Labs Reviewed - No data to display ____________________________________________  EKG  none  ____________________________________________  RADIOLOGY  none  ____________________________________________   PROCEDURES  Procedure(s) performed: None Procedures Critical Care performed:  None ____________________________________________   INITIAL IMPRESSION / ASSESSMENT AND PLAN / ED COURSE  53 y.o. female with a history of cocaine abuse, schizophrenia, CVA, bipolar disorder who presents for evaluation of a rash x 2 days. Exam consistent with shingles. Patient started on valacyclovir and gabapentin. Discussed return precautions, outpatient management, close f/u with PCP.       As part of my medical decision making, I  reviewed the following data within the electronic MEDICAL RECORD NUMBER Nursing notes reviewed and incorporated, Old chart reviewed, Notes from prior ED visits and [place] Controlled Substance Database   Patient was evaluated in Emergency Department today for the symptoms described in the history of present illness. Patient was evaluated in the context of the global COVID-19 pandemic, which necessitated consideration that the patient might be at risk for infection with the SARS-CoV-2 virus that causes COVID-19. Institutional protocols and algorithms that pertain to the evaluation of patients at risk for COVID-19 are in a state of rapid change based on information released by regulatory bodies including the CDC and federal and state organizations. These policies and algorithms were followed during the patient's care in the ED.   ____________________________________________   FINAL CLINICAL IMPRESSION(S) / ED DIAGNOSES   Final diagnoses:  Herpes zoster without complication      NEW MEDICATIONS STARTED DURING THIS VISIT:  ED Discharge Orders          Ordered    valACYclovir (VALTREX) 1000 MG tablet  3 times daily     08/28/18 0237    gabapentin (NEURONTIN) 100 MG capsule  3 times daily     08/28/18 [id number]           Note:  This document was prepared using Dragon voice recognition software and may include unintentional dictation errors.    Don PerkingVeronese, [place], MD 08/28/18 [phone]

## 2018-08-28 NOTE — ED Triage Notes (Signed)
Patient ambulatory to triage with steady gait, without difficulty or distress noted; pt reports itchy rash to back/left leg x 2 days with no known cause; using cortisone cream without relief

## 2018-08-31 ENCOUNTER — Ambulatory Visit (INDEPENDENT_AMBULATORY_CARE_PROVIDER_SITE_OTHER): Payer: Medicare Other | Admitting: Nurse Practitioner

## 2018-08-31 ENCOUNTER — Encounter: Payer: Self-pay | Admitting: Nurse Practitioner

## 2018-08-31 ENCOUNTER — Other Ambulatory Visit: Payer: Self-pay

## 2018-08-31 VITALS — BP 116/70 | HR 90 | Ht 60.0 in | Wt 94.2 lb

## 2018-08-31 DIAGNOSIS — B029 Zoster without complications: Secondary | ICD-10-CM

## 2018-08-31 NOTE — Patient Instructions (Addendum)
[name],   Thank you for coming in to clinic today.  1. Continue taking your valacyclovir and gabapentin that were prescribed by the Emergency Room.  2. Prescription fill and pickup records were reviewed with [facility].  You did pick up your gabapentin.  This prescription will not be re-prescribed.  3. You are to continue regular followup for your back pain with Dr. Holley Raring.  Please schedule a follow-up appointment with Cassell Smiles, AGNP. Return if symptoms worsen or fail to improve.  If you have any other questions or concerns, please feel free to call the clinic or send a message through [place]. You may also schedule an earlier appointment if necessary.  You will receive a survey after today's visit either digitally by e-mail or paper by [employer]. Your experiences and feedback matter to [place].  Please respond so we know how we are doing as we provide care for you.   Cassell Smiles, DNP, AGNP-BC Adult Gerontology Nurse Practitioner [place]

## 2018-08-31 NOTE — Progress Notes (Signed)
Subjective:    Patient ID: [name], female    DOB: 1965-11-22, 53 y.o.   MRN: [id number]  [name] is a 53 y.o. female presenting on 08/31/2018 for Herpes Zoster (f/u hospital left leg and groin area )   HPI Herpes Zoster Patient continues to have herpes zoster rash.  Lesions are in various stages of healing with some past pustular stage.  Main complaint today is ongoing burning neuropathic pain.  Patient states she is not taking her gabapentin currently.  She claims she did not get it filled.  Overall, patient reports rash has started to improve.  Social History   Tobacco Use  . Smoking status: Current Every Day Smoker    Packs/day: 0.50    Years: 39.00    Pack years: 19.50    Types: Cigarettes  . Smokeless tobacco: Never Used  . Tobacco comment: Interested in resources   Substance Use Topics  . Alcohol use: Not Currently    Alcohol/week: 0.0 standard drinks    Comment: 49 cans of beer weekly hx  . Drug use: No    Types: Cocaine    Review of Systems Per HPI unless specifically indicated above     Objective:    BP 116/70 (BP Location: Right Arm, Patient Position: Sitting, Cuff Size: Small)   Pulse 90   Ht 5' (1.524 m)   Wt 94 lb 3.2 oz (42.7 kg)   BMI 18.40 kg/m   Wt Readings from Last 3 Encounters:  08/31/18 94 lb 3.2 oz (42.7 kg)  08/28/18 102 lb (46.3 kg)  07/20/18 98 lb (44.5 kg)    Physical Exam Vitals signs reviewed.  Constitutional:      General: She is not in acute distress.    Appearance: She is well-developed.  HENT:     Head: Normocephalic and atraumatic.  Cardiovascular:     Rate and Rhythm: Normal rate and regular rhythm.     Pulses: Normal pulses.     Heart sounds: Normal heart sounds.  Pulmonary:     Effort: Pulmonary effort is normal.     Breath sounds: Normal breath sounds.  Skin:    General: Skin is warm and dry.       Neurological:     Mental Status: She is alert and oriented to person, place, and time. Mental status  is at baseline.  Psychiatric:        Mood and Affect: Mood normal.        Behavior: Behavior normal.        Thought Content: Thought content normal.        Judgment: Judgment normal.      Results for orders placed or performed during the hospital encounter of 04/29/18  CBC with Differential  Result Value Ref Range   WBC 10.9 (H) 4.0 - 10.5 K/uL   RBC 5.61 (H) 3.87 - 5.11 MIL/uL   Hemoglobin 17.5 (H) 12.0 - 15.0 g/dL   HCT 40.9 (H) 81.1 - 91.4 %   MCV 89.5 80.0 - 100.0 fL   MCH 31.2 26.0 - 34.0 pg   MCHC 34.9 30.0 - 36.0 g/dL   RDW 78.2 95.6 - 21.3 %   Platelets 210 150 - 400 K/uL   nRBC 0.0 0.0 - 0.2 %   Neutrophils Relative % 65 %   Neutro Abs 7.1 1.7 - 7.7 K/uL   Lymphocytes Relative 29 %   Lymphs Abs 3.1 0.7 - 4.0 K/uL   Monocytes Relative 6 %  Monocytes Absolute 0.6 0.1 - 1.0 K/uL   Eosinophils Relative 0 %   Eosinophils Absolute 0.0 0.0 - 0.5 K/uL   Basophils Relative 0 %   Basophils Absolute 0.0 0.0 - 0.1 K/uL   Immature Granulocytes 0 %   Abs Immature Granulocytes 0.03 0.00 - 0.07 K/uL  Basic metabolic panel  Result Value Ref Range   Sodium 139 135 - 145 mmol/L   Potassium 3.7 3.5 - 5.1 mmol/L   Chloride 106 98 - 111 mmol/L   CO2 21 (L) 22 - 32 mmol/L   Glucose, Bld 90 70 - 99 mg/dL   BUN <5 (L) 6 - 20 mg/dL   Creatinine, Ser 1.61 0.44 - 1.00 mg/dL   Calcium 9.1 8.9 - 09.6 mg/dL   GFR calc non Af Amer >60 >60 mL/min   GFR calc Af Amer >60 >60 mL/min   Anion gap 12 5 - 15      Assessment & Plan:   Problem List Items Addressed This Visit    None    Visit Diagnoses    Herpes zoster without complication    -  Primary    Improving herpes zoster infection with persistent neuropathic pain.  Patient with inadequate therapy today.  Has not taken gabapentin as previously prescribed by emergency room.  Phone call was placed to [facility] where patient gets her medications filled.  Patient did actually fill her gabapentin.  Upon additional questioning of my  CMA, patient showed CMA her bottle of pills but did not show me.  Patient exhibits drug-seeking behaviors.  Plan: 1.  Encouraged patient to take her existing prescription of gabapentin.  No refills will be provided today. 2.  Continue and complete valacyclovir course of treatment. 3.  Follow-up in clinic as needed 1 to 2 weeks   Follow up plan: Return if symptoms worsen or fail to improve.  Wilhelmina Mcardle, DNP, AGPCNP-BC Adult Gerontology Primary Care Nurse Practitioner [facility] 08/31/2018, 11:49 AM

## 2018-09-03 ENCOUNTER — Other Ambulatory Visit: Payer: Self-pay

## 2018-09-03 ENCOUNTER — Emergency Department: Payer: Medicare Other

## 2018-09-03 ENCOUNTER — Emergency Department
Admission: EM | Admit: 2018-09-03 | Discharge: 2018-09-03 | Disposition: A | Discharge status: Home / Self Care | Payer: Medicare Other | Attending: Student in an Organized Health Care Education/Training Program | Admitting: Student in an Organized Health Care Education/Training Program

## 2018-09-03 DIAGNOSIS — Z8673 Personal history of transient ischemic attack (TIA), and cerebral infarction without residual deficits: Secondary | ICD-10-CM | POA: Insufficient documentation

## 2018-09-03 DIAGNOSIS — F17210 Nicotine dependence, cigarettes, uncomplicated: Secondary | ICD-10-CM | POA: Diagnosis not present

## 2018-09-03 DIAGNOSIS — R7889 Finding of other specified substances, not normally found in blood: Secondary | ICD-10-CM

## 2018-09-03 DIAGNOSIS — R892 Abnormal level of other drugs, medicaments and biological substances in specimens from other organs, systems and tissues: Secondary | ICD-10-CM | POA: Insufficient documentation

## 2018-09-03 DIAGNOSIS — Z7982 Long term (current) use of aspirin: Secondary | ICD-10-CM | POA: Diagnosis not present

## 2018-09-03 DIAGNOSIS — G40909 Epilepsy, unspecified, not intractable, without status epilepticus: Secondary | ICD-10-CM | POA: Diagnosis present

## 2018-09-03 DIAGNOSIS — Z5181 Encounter for therapeutic drug level monitoring: Secondary | ICD-10-CM

## 2018-09-03 DIAGNOSIS — Z79899 Other long term (current) drug therapy: Secondary | ICD-10-CM | POA: Diagnosis not present

## 2018-09-03 DIAGNOSIS — J45909 Unspecified asthma, uncomplicated: Secondary | ICD-10-CM | POA: Diagnosis not present

## 2018-09-03 DIAGNOSIS — R569 Unspecified convulsions: Secondary | ICD-10-CM

## 2018-09-03 LAB — CBC
HCT: 36.8 % (ref 36.0–46.0)
Hemoglobin: 12.6 g/dL (ref 12.0–15.0)
MCH: 31.7 pg (ref 26.0–34.0)
MCHC: 34.2 g/dL (ref 30.0–36.0)
MCV: 92.5 fL (ref 80.0–100.0)
Platelets: 194 10*3/uL (ref 150–400)
RBC: 3.98 MIL/uL (ref 3.87–5.11)
RDW: 13.1 % (ref 11.5–15.5)
WBC: 7.2 10*3/uL (ref 4.0–10.5)
nRBC: 0 % (ref 0.0–0.2)

## 2018-09-03 LAB — BASIC METABOLIC PANEL
Anion gap: 12 (ref 5–15)
BUN: 10 mg/dL (ref 6–20)
CO2: 17 mmol/L — ABNORMAL LOW (ref 22–32)
Calcium: 9.1 mg/dL (ref 8.9–10.3)
Chloride: 105 mmol/L (ref 98–111)
Creatinine, Ser: 0.72 mg/dL (ref 0.44–1.00)
GFR calc Af Amer: >60 mL/min (ref >60)
GFR calc non Af Amer: >60 mL/min (ref >60)
Glucose, Bld: 111 mg/dL — ABNORMAL HIGH (ref 70–99)
Potassium: 3.2 mmol/L — ABNORMAL LOW (ref 3.5–5.1)
Sodium: 134 mmol/L — ABNORMAL LOW (ref 135–145)

## 2018-09-03 LAB — URINE DRUG SCREEN, QUALITATIVE (ARMC ONLY)
Amphetamines, Ur Screen: NOT DETECTED
Barbiturates, Ur Screen: NOT DETECTED
Benzodiazepine, Ur Scrn: NOT DETECTED
Cannabinoid 50 Ng, Ur ~~LOC~~: NOT DETECTED
Cocaine Metabolite,Ur ~~LOC~~: NOT DETECTED
MDMA (Ecstasy)Ur Screen: NOT DETECTED
Methadone Scn, Ur: NOT DETECTED
Opiate, Ur Screen: NOT DETECTED
Phencyclidine (PCP) Ur S: NOT DETECTED
Tricyclic, Ur Screen: NOT DETECTED

## 2018-09-03 LAB — URINALYSIS, COMPLETE (UACMP) WITH MICROSCOPIC
Bilirubin Urine: NEGATIVE
Glucose, UA: NEGATIVE mg/dL
Hgb urine dipstick: NEGATIVE
Ketones, ur: NEGATIVE mg/dL
Leukocytes,Ua: NEGATIVE
Nitrite: NEGATIVE
Protein, ur: NEGATIVE mg/dL
Specific Gravity, Urine: 1.003 — ABNORMAL LOW (ref 1.005–1.030)
pH: 5 (ref 5.0–8.0)

## 2018-09-03 LAB — PHENYTOIN LEVEL, TOTAL: Phenytoin Lvl: <2.5 ug/mL — ABNORMAL LOW (ref 10.0–20.0)

## 2018-09-03 LAB — GLUCOSE, CAPILLARY: Glucose-Capillary: 133 mg/dL — ABNORMAL HIGH (ref 70–99)

## 2018-09-03 LAB — VALPROIC ACID LEVEL: Valproic Acid Lvl: 72 ug/mL (ref 50.0–100.0)

## 2018-09-03 MED ORDER — DIPHENHYDRAMINE HCL 50 MG/ML IJ SOLN
12.5000 mg | Freq: Once | INTRAMUSCULAR | Status: AC
  Administered 2018-09-03: 16:00:00 12.5 mg via INTRAVENOUS

## 2018-09-03 MED ORDER — DIPHENHYDRAMINE HCL 50 MG/ML IJ SOLN
INTRAMUSCULAR | Status: AC
  Filled 2018-09-03: qty 1

## 2018-09-03 MED ORDER — FLUORESCEIN SODIUM 1 MG OP STRP
1.0000 | ORAL_STRIP | Freq: Once | OPHTHALMIC | Status: AC
  Administered 2018-09-03: 13:00:00 1 via OPHTHALMIC
  Filled 2018-09-03: qty 1

## 2018-09-03 MED ORDER — SODIUM CHLORIDE 0.9 % IV SOLN
1000.0000 mg | Freq: Once | INTRAVENOUS | Status: AC
  Administered 2018-09-03: 16:00:00 1000 mg via INTRAVENOUS
  Filled 2018-09-03: qty 20

## 2018-09-03 MED ORDER — ONDANSETRON HCL 4 MG/2ML IJ SOLN
4.0000 mg | Freq: Once | INTRAMUSCULAR | Status: AC
  Administered 2018-09-03: 18:00:00 4 mg via INTRAVENOUS
  Filled 2018-09-03: qty 2

## 2018-09-03 MED ORDER — ACETAMINOPHEN 500 MG PO TABS
1000.0000 mg | ORAL_TABLET | Freq: Once | ORAL | Status: AC
  Administered 2018-09-03: 1000 mg via ORAL
  Filled 2018-09-03: qty 2

## 2018-09-03 MED ORDER — DIVALPROEX SODIUM 250 MG PO DR TAB
250.0000 mg | DELAYED_RELEASE_TABLET | Freq: Once | ORAL | Status: AC
  Administered 2018-09-03: 13:00:00 250 mg via ORAL
  Filled 2018-09-03: qty 1

## 2018-09-03 NOTE — TOC Initial Note (Signed)
Transition of Care [facility]) - Initial/Assessment Note    Patient Details  Name: [name] MRN: [id number] Date of Birth: 10-30-1965  Transition [facility]) CM/SW Contact:    Fredric Mare, LCSW Phone Number: 09/03/2018, 2:28 PM  Clinical Narrative:                  Patient is a 53 year old who presents to the ED for seizures. Patient was present with her neighbor.  CSW was consulted for medication assistance. Patient reports that she gets her medications delivered by [facility], and the reason she has not been recently compliant is because she received her medications late. Patient states that she now has enough medications to last for the next month.  Patient shares that she saw her PCP this past Tuesday for shingles. Patient denied needing any financial assistance with getting her medications. CSW provided patient with community resources in case her medications are late again. Patient thanked CSW.   Expected Discharge Plan: Home/Self Care Barriers to Discharge: Continued Medical Work up   Patient Goals and CMS Choice        Expected Discharge Plan and Services Expected Discharge Plan: Home/Self Care   Discharge Planning Services: CM Consult                                          Prior Living Arrangements/Services   Lives with:: Self Patient language and need for interpreter reviewed:: Yes Do you feel safe going back to the place where you live?: Yes      Need for Family Participation in Patient Care: No (Comment) Care giver support system in place?: No (comment)   Criminal Activity/Legal Involvement Pertinent to Current Situation/Hospitalization: No - Comment as needed  Activities of Daily Living      Permission Sought/Granted   Permission granted to share information with : Yes, Verbal Permission Granted        Permission granted to share info w Relationship: Neighbor     Emotional Assessment Appearance:: Appears older than stated  age Attitude/Demeanor/Rapport: Engaged Affect (typically observed): Stable, Hopeful Orientation: : Oriented to Self, Oriented to Place, Oriented to  Time, Oriented to Situation Alcohol / Substance Use: Tobacco Use, Illicit Drugs Psych Involvement: No (comment)  Admission diagnosis:  seizure Patient Active Problem List   Diagnosis Date Noted  . Lumbar radicular pain (left) 06/10/2018  . History of cocaine abuse ([place]) 06/10/2018  . Polysubstance abuse ([place]) 06/10/2018  . Acute renal failure (ARF) ([place]) 01/07/2018  . Bipolar disorder ([place]) 07/14/2017  . Somatic dysfunction of spine, lumbar 12/11/2016  . Somatic dysfunction of spine, thoracic 12/11/2016  . Low back pain 11/27/2016  . Spasm of muscle of lower back 11/27/2016  . Cyst of skin of breast 03/18/2016  . Eczema 05/16/2015  . Cocaine abuse in remission ([place]) 08/22/2014  . Seizure disorder ([place]) 08/21/2014   PCP:  Mikey College, NP Pharmacy:   [facility], [address] [place] [address] Phone: [phone] Fax: [phone]     Social Determinants of Health (SDOH) Interventions    Readmission Risk Interventions No flowsheet data found.

## 2018-09-03 NOTE — ED Notes (Signed)
ED Provider at bedside. 

## 2018-09-03 NOTE — ED Notes (Addendum)
Pt seen here today for follow up regarding her on-going shingles treatment that is "not working" per patient; "its spread my eye & neck". Pt states that she has been compliant on her Dilantin and Depakote. Pt states she does not remember having an aura prior to her seizing episode. Pt lying in recovery position & VS stable WNL.

## 2018-09-03 NOTE — ED Notes (Signed)
Pt c/o generalized itching r/t shingles. MD notified. See Mar.

## 2018-09-03 NOTE — ED Provider Notes (Signed)
[facility] Emergency Department Provider Note    First MD Initiated Contact with Patient 09/03/18 1237     (approximate)  I have reviewed the triage vital signs and the nursing notes.   HISTORY  Chief Complaint Seizures    HPI [name] is a 53 y.o. female extensive past medical history as well as polysubstance abuse as listed below with recent evaluation in the ER and treatment for zoster discharged home on valacyclovir and gabapentin presents to the ER for itching of the left side of her face as well as right shoulder.  Patient fell like she was having severe itching started scratching and then had a witnessed seizure-like activity.  States it was brief in nature.  States that she usually has about 1 seizure per week.  She denies any headache.  No neck stiffness.  Denies any fevers.  States that she has been compliant with her medications but on review of past medical records she is frequently subtherapeutic on her Depakote and Dilantin.    Past Medical History:  Diagnosis Date  . Acute renal failure (ARF) ([place]) 01/07/2018  . Allergy   . Anemia   . Arthritis   . Asthma    NO INHALERS  . Bipolar disorder ([place])   . Cocaine abuse in remission ([place])   . Depression   . Dyspnea   . Eczema   . Nervous   . Schizophrenia ([place])   . Scoliosis   . Seizure ([place])   . Seizures ([place])    Last seizure, May 20, 2016-PT WAS ALONE-UNSURE LENGTH OF SEIZURE-SCRAPED NOSE AND LIP  . Stroke [facility]) 2007  . Tobacco use    Family History  Problem Relation Age of Onset  . Epilepsy Mother   . Seizures Mother   . Heart disease Father   . Epilepsy Maternal Grandmother   . Heart attack Maternal Grandmother    Past Surgical History:  Procedure Laterality Date  . APPENDECTOMY  2000  . BREAST CYST EXCISION Bilateral 05/29/2016   Procedure: MASS EXCISION CHEST WALL;  Surgeon: Clayburn Pert, MD;  Location: [facility] ORS;  Service: General;  Laterality: Bilateral;  .  DILATION AND CURETTAGE OF UTERUS     Patient Active Problem List   Diagnosis Date Noted  . Lumbar radicular pain (left) 06/10/2018  . History of cocaine abuse ([place]) 06/10/2018  . Polysubstance abuse ([place]) 06/10/2018  . Acute renal failure (ARF) ([place]) 01/07/2018  . Bipolar disorder ([place]) 07/14/2017  . Somatic dysfunction of spine, lumbar 12/11/2016  . Somatic dysfunction of spine, thoracic 12/11/2016  . Low back pain 11/27/2016  . Spasm of muscle of lower back 11/27/2016  . Cyst of skin of breast 03/18/2016  . Eczema 05/16/2015  . Cocaine abuse in remission ([place]) 08/22/2014  . Seizure disorder ([place]) 08/21/2014      Prior to Admission medications   Medication Sig Start Date End Date Taking? Authorizing Provider  albuterol (VENTOLIN HFA) 108 (90 Base) MCG/ACT inhaler  08/11/18   [provider]  aspirin EC 81 MG tablet Take 1 tablet by mouth daily. 10/15/17 10/15/18  [provider]  clotrimazole-betamethasone (LOTRISONE) cream APPLY TOPICALLY TWICE DAILY FOR 14 DAYS Patient not taking: Reported on 08/31/2018 08/11/18   Olin Hauser, DO  divalproex (DEPAKOTE) 500 MG DR tablet Take 1 tab in the morning and 2 tabs at night 04/29/18   Carrie Mew, MD  gabapentin (NEURONTIN) 100 MG capsule Take 1 capsule (100 mg total) by mouth 3 (  three) times daily. Patient not taking: Reported on 08/31/2018 08/28/18 08/28/19  Nita SickleVeronese, [place], MD  phenytoin (DILANTIN) 100 MG ER capsule Take 2 capsules (200 mg total) by mouth at bedtime. 04/29/18 06/30/19  Sharman CheekStafford, Phillip, MD  valACYclovir (VALTREX) 1000 MG tablet Take 2 tablets (2,000 mg total) by mouth 3 (three) times daily for 10 days. 08/28/18 09/07/18  Nita SickleVeronese, [place], MD    Allergies Flagyl [metronidazole]    Social History Social History   Tobacco Use  . Smoking status: Current Every Day Smoker    Packs/day: 0.50    Years: 39.00    Pack years: 19.50    Types: Cigarettes  . Smokeless tobacco: Never Used  .  Tobacco comment: Interested in resources   Substance Use Topics  . Alcohol use: Not Currently    Alcohol/week: 0.0 standard drinks    Comment: 49 cans of beer weekly hx  . Drug use: No    Types: Cocaine    Review of Systems Patient denies headaches, rhinorrhea, blurry vision, numbness, shortness of breath, chest pain, edema, cough, abdominal pain, nausea, vomiting, diarrhea, dysuria, fevers, rashes or hallucinations unless otherwise stated above in HPI. ____________________________________________   PHYSICAL EXAM:  VITAL SIGNS: Vitals:   09/03/18 1315 09/03/18 1330  BP:  127/85  Pulse: 88   Resp:  14  Temp:    SpO2: 97%     Constitutional: Alert and oriented.  Eyes: Conjunctivae are normal.  No dendritic lesion or fluorescein uptake on [place] lamp exam Head: Atraumatic. Nose: No congestion/rhinnorhea. Mouth/Throat: Mucous membranes are moist.   Neck: No stridor. Painless ROM. No meningismus Cardiovascular: Normal rate, regular rhythm. Grossly normal heart sounds.  Good peripheral circulation. Respiratory: Normal respiratory effort.  No retractions. Lungs CTAB. Gastrointestinal: Soft and nontender. No distention. No abdominal bruits. No CVA tenderness. Genitourinary:  Musculoskeletal: No lower extremity tenderness nor edema.  No joint effusions. Neurologic:  Normal speech and language. No gross focal neurologic deficits are appreciated. No facial droop Skin:  Skin is warm, dry and intact.  Healing left leg dermatomal vesicular rash.  Nonpurulent non-erythematous vesicular rash on the right shoulder as well as a few scattered lesions in the left cheek. Psychiatric: Mood and affect are normal. Speech and behavior are appropriate  ____________________________________________   LABS (all labs ordered are listed, but only abnormal results are displayed)  Results for orders placed or performed during the hospital encounter of 09/03/18 (from the past 24 hour(s))  CBC - if new  onset seizures     Status: None   Collection Time: 09/03/18 12:02 PM  Result Value Ref Range   WBC 7.2 4.0 - 10.5 K/uL   RBC 3.98 3.87 - 5.11 MIL/uL   Hemoglobin 12.6 12.0 - 15.0 g/dL   HCT 16.136.8 09.636.0 - 04.546.0 %   MCV 92.5 80.0 - 100.0 fL   MCH 31.7 26.0 - 34.0 pg   MCHC 34.2 30.0 - 36.0 g/dL   RDW 40.913.1 81.111.5 - 91.415.5 %   Platelets 194 150 - 400 K/uL   nRBC 0.0 0.0 - 0.2 %  Basic metabolic panel - if new onset seizures     Status: Abnormal   Collection Time: 09/03/18 12:02 PM  Result Value Ref Range   Sodium 134 (L) 135 - 145 mmol/L   Potassium 3.2 (L) 3.5 - 5.1 mmol/L   Chloride 105 98 - 111 mmol/L   CO2 17 (L) 22 - 32 mmol/L   Glucose, Bld 111 (H) 70 - 99 mg/dL   BUN  10 6 - 20 mg/dL   Creatinine, Ser 1.610.72 0.44 - 1.00 mg/dL   Calcium 9.1 8.9 - 09.610.3 mg/dL   GFR calc non Af Amer >60 >60 mL/min   GFR calc Af Amer >60 >60 mL/min   Anion gap 12 5 - 15  Glucose, capillary     Status: Abnormal   Collection Time: 09/03/18 12:04 PM  Result Value Ref Range   Glucose-Capillary 133 (H) 70 - 99 mg/dL  Phenytoin level, total     Status: Abnormal   Collection Time: 09/03/18 12:26 PM  Result Value Ref Range   Phenytoin Lvl <2.5 (L) 10.0 - 20.0 ug/mL  Valproic acid level     Status: None   Collection Time: 09/03/18 12:26 PM  Result Value Ref Range   Valproic Acid Lvl 72 50.0 - 100.0 ug/mL  Urinalysis, Complete w Microscopic     Status: Abnormal   Collection Time: 09/03/18  1:39 PM  Result Value Ref Range   Color, Urine STRAW (A) YELLOW   APPearance CLEAR (A) CLEAR   Specific Gravity, Urine 1.003 (L) 1.005 - 1.030   pH 5.0 5.0 - 8.0   Glucose, UA NEGATIVE NEGATIVE mg/dL   Hgb urine dipstick NEGATIVE NEGATIVE   Bilirubin Urine NEGATIVE NEGATIVE   Ketones, ur NEGATIVE NEGATIVE mg/dL   Protein, ur NEGATIVE NEGATIVE mg/dL   Nitrite NEGATIVE NEGATIVE   Leukocytes,Ua NEGATIVE NEGATIVE   RBC / HPF 0-5 0 - 5 RBC/hpf   WBC, UA 0-5 0 - 5 WBC/hpf   Bacteria, UA RARE (A) NONE SEEN   Squamous  Epithelial / LPF 0-5 0 - 5  Urine Drug Screen, Qualitative ([facility] only)     Status: None   Collection Time: 09/03/18  1:39 PM  Result Value Ref Range   Tricyclic, Ur Screen NONE DETECTED NONE DETECTED   Amphetamines, Ur Screen NONE DETECTED NONE DETECTED   MDMA (Ecstasy)Ur Screen NONE DETECTED NONE DETECTED   Cocaine Metabolite,Ur [place] NONE DETECTED NONE DETECTED   Opiate, Ur Screen NONE DETECTED NONE DETECTED   Phencyclidine (PCP) Ur S NONE DETECTED NONE DETECTED   Cannabinoid 50 Ng, Ur [place] NONE DETECTED NONE DETECTED   Barbiturates, Ur Screen NONE DETECTED NONE DETECTED   Benzodiazepine, Ur Scrn NONE DETECTED NONE DETECTED   Methadone Scn, Ur NONE DETECTED NONE DETECTED   ____________________________________________  EKG My review and personal interpretation at Time: 11:54   Indication: seizure  Rate: 95  Rhythm: sinus Axis: normal Other: normal intervals, no stemi ____________________________________________  RADIOLOGY   ____________________________________________   PROCEDURES  Procedure(s) performed:  Procedures    Critical Care performed: no ____________________________________________   INITIAL IMPRESSION / ASSESSMENT AND PLAN / ED COURSE  Pertinent labs & imaging results that were available during my care of the patient were reviewed by me and considered in my medical decision making (see chart for details).   DDX: Seizure, dehydration, electrolyte abnormality, shingles, meningitis, encephalitis, noncompliance, substance abuse  [name] is a 53 y.o. who presents to the ED with symptoms as described above.  She is afebrile nontoxic-appearing.  Does have 2 additional lesions that have developed.  She states that she is been compliant with her Valtrex.  She does not have any meningismus.  States that she has a mild headache which she states that she frequently gets after her seizures.  States that she has been having trouble affording her antiepileptic  medications.  Clinically I have a low suspicion for meningitis or encephalitis.  Will observe in  the ER were blood work check levels.  Clinical Course as of Sep 02 1801  Thu Sep 03, 2018  1358 Patient feeling well requesting something to eat.  Depakote level is appropriate but her phenytoin level remains low.  Will re-dose this.   [PR]    Clinical Course User Index [PR] Willy Eddyobinson, Marilene Vath, MD   Patient has been observed in the ER for 5 hours.  She is well-appearing remains afebrile.  She has no signs or symptoms of infectious process.  She is tolerating oral hydration.  Was complaining of mild nausea therefore will give Zofran.   Have ensured the patient has adequate access to her AED medication   The patient was evaluated in Emergency Department today for the symptoms described in the history of present illness. He/she was evaluated in the context of the global COVID-19 pandemic, which necessitated consideration that the patient might be at risk for infection with the SARS-CoV-2 virus that causes COVID-19. Institutional protocols and algorithms that pertain to the evaluation of patients at risk for COVID-19 are in a state of rapid change based on information released by regulatory bodies including the CDC and federal and state organizations. These policies and algorithms were followed during the patient's care in the ED.  As part of my medical decision making, I reviewed the following data within the electronic MEDICAL RECORD NUMBER Nursing notes reviewed and incorporated, Labs reviewed, notes from prior ED visits and [place] Controlled Substance Database   ____________________________________________   FINAL CLINICAL IMPRESSION(S) / ED DIAGNOSES  Final diagnoses:  Seizure (HCC)  Seizure disorder (HCC)  Subtherapeutic phenytoin level      NEW MEDICATIONS STARTED DURING THIS VISIT:  New Prescriptions   No medications on file     Note:  This document was prepared using Dragon voice  recognition software and may include unintentional dictation errors.    Willy Eddyobinson, Shafer Swamy, MD 09/03/18 [phone]

## 2018-09-03 NOTE — ED Triage Notes (Signed)
PT arrives post witnessed seizure from neighbor. Pt has HX of seizures and reports compliance with her medication of depakote dilantin. In triage pt a& o x 4 with no complaints of pain.

## 2018-09-03 NOTE — ED Notes (Signed)
Pt given ice cream, okay per Dr. Quentin Cornwall.

## 2018-09-03 NOTE — Discharge Instructions (Signed)
Be sure to take the Depakote 1 tab in the morning and 2 tabs at night as prescribed as well as 2 capsules of your 100 mg of Dilantin nightly.  Please follow-up with neurology.  Return for any additional questions or concerns.

## 2018-09-03 NOTE — ED Notes (Addendum)
Pt seen "seizing" in sub wait by staff. Pt immediately brought to room 10 for further evaluation. MD notified

## 2018-09-04 ENCOUNTER — Encounter: Payer: Self-pay | Admitting: Nurse Practitioner

## 2018-09-25 ENCOUNTER — Other Ambulatory Visit: Payer: Self-pay | Admitting: Family Medicine

## 2018-09-25 DIAGNOSIS — B354 Tinea corporis: Secondary | ICD-10-CM

## 2018-10-06 ENCOUNTER — Other Ambulatory Visit: Payer: Self-pay | Admitting: Family Medicine

## 2018-10-13 ENCOUNTER — Other Ambulatory Visit: Payer: Self-pay | Admitting: Family Medicine

## 2018-10-17 ENCOUNTER — Emergency Department: Payer: Medicare Other

## 2018-10-17 ENCOUNTER — Other Ambulatory Visit: Payer: Self-pay

## 2018-10-17 ENCOUNTER — Encounter: Payer: Self-pay | Admitting: Emergency Medicine

## 2018-10-17 ENCOUNTER — Emergency Department
Admission: EM | Admit: 2018-10-17 | Discharge: 2018-10-17 | Disposition: A | Discharge status: Home / Self Care | Payer: Medicare Other | Attending: Emergency Medicine | Admitting: Emergency Medicine

## 2018-10-17 DIAGNOSIS — R202 Paresthesia of skin: Secondary | ICD-10-CM | POA: Insufficient documentation

## 2018-10-17 DIAGNOSIS — Z5321 Procedure and treatment not carried out due to patient leaving prior to being seen by health care provider: Secondary | ICD-10-CM | POA: Insufficient documentation

## 2018-10-17 LAB — CBC WITH DIFFERENTIAL/PLATELET
Abs Immature Granulocytes: 0.01 10*3/uL (ref 0.00–0.07)
Basophils Absolute: 0 10*3/uL (ref 0.0–0.1)
Basophils Relative: 1 %
Eosinophils Absolute: 0 10*3/uL (ref 0.0–0.5)
Eosinophils Relative: 0 %
HCT: 41.5 % (ref 36.0–46.0)
Hemoglobin: 14.8 g/dL (ref 12.0–15.0)
Immature Granulocytes: 0 %
Lymphocytes Relative: 33 %
Lymphs Abs: 2.1 10*3/uL (ref 0.7–4.0)
MCH: 31.7 pg (ref 26.0–34.0)
MCHC: 35.7 g/dL (ref 30.0–36.0)
MCV: 88.9 fL (ref 80.0–100.0)
Monocytes Absolute: 0.3 10*3/uL (ref 0.1–1.0)
Monocytes Relative: 5 %
Neutro Abs: 3.9 10*3/uL (ref 1.7–7.7)
Neutrophils Relative %: 61 %
Platelets: 162 10*3/uL (ref 150–400)
RBC: 4.67 MIL/uL (ref 3.87–5.11)
RDW: 13.2 % (ref 11.5–15.5)
WBC: 6.4 10*3/uL (ref 4.0–10.5)
nRBC: 0 % (ref 0.0–0.2)

## 2018-10-17 LAB — COMPREHENSIVE METABOLIC PANEL
ALT: 25 U/L (ref 0–44)
AST: 48 U/L — ABNORMAL HIGH (ref 15–41)
Albumin: 4.8 g/dL (ref 3.5–5.0)
Alkaline Phosphatase: 82 U/L (ref 38–126)
Anion gap: 12 (ref 5–15)
BUN: 6 mg/dL (ref 6–20)
CO2: 21 mmol/L — ABNORMAL LOW (ref 22–32)
Calcium: 9.5 mg/dL (ref 8.9–10.3)
Chloride: 106 mmol/L (ref 98–111)
Creatinine, Ser: 0.43 mg/dL — ABNORMAL LOW (ref 0.44–1.00)
GFR calc Af Amer: >60 mL/min (ref >60)
GFR calc non Af Amer: >60 mL/min (ref >60)
Glucose, Bld: 88 mg/dL (ref 70–99)
Potassium: 3.8 mmol/L (ref 3.5–5.1)
Sodium: 139 mmol/L (ref 135–145)
Total Bilirubin: 0.4 mg/dL (ref 0.3–1.2)
Total Protein: 8.4 g/dL — ABNORMAL HIGH (ref 6.5–8.1)

## 2018-10-17 LAB — URINALYSIS, COMPLETE (UACMP) WITH MICROSCOPIC
Bacteria, UA: NONE SEEN
Bilirubin Urine: NEGATIVE
Glucose, UA: NEGATIVE mg/dL
Hgb urine dipstick: NEGATIVE
Ketones, ur: NEGATIVE mg/dL
Leukocytes,Ua: NEGATIVE
Nitrite: NEGATIVE
Protein, ur: NEGATIVE mg/dL
Specific Gravity, Urine: 1.002 — ABNORMAL LOW (ref 1.005–1.030)
Squamous Epithelial / HPF: NONE SEEN (ref 0–5)
pH: 5 (ref 5.0–8.0)

## 2018-10-17 NOTE — ED Notes (Signed)
Called for room, not in waiting room.  

## 2018-10-17 NOTE — ED Notes (Signed)
3rd call, not in waiting room, bathroom or front. Assumed left.

## 2018-10-17 NOTE — ED Notes (Signed)
Called for room, no answer

## 2018-10-17 NOTE — ED Triage Notes (Signed)
Patient brought in by ems from home. Patient reports right arm numbness that started about 18:00 last night.

## 2018-10-28 ENCOUNTER — Other Ambulatory Visit: Payer: Self-pay | Admitting: Family Medicine

## 2018-10-28 DIAGNOSIS — Z1231 Encounter for screening mammogram for malignant neoplasm of breast: Secondary | ICD-10-CM

## 2018-11-13 DIAGNOSIS — Y929 Unspecified place or not applicable: Secondary | ICD-10-CM | POA: Diagnosis not present

## 2018-11-13 DIAGNOSIS — Y939 Activity, unspecified: Secondary | ICD-10-CM | POA: Insufficient documentation

## 2018-11-13 DIAGNOSIS — S60861A Insect bite (nonvenomous) of right wrist, initial encounter: Secondary | ICD-10-CM | POA: Diagnosis not present

## 2018-11-13 DIAGNOSIS — Y999 Unspecified external cause status: Secondary | ICD-10-CM | POA: Diagnosis not present

## 2018-11-13 DIAGNOSIS — Z8673 Personal history of transient ischemic attack (TIA), and cerebral infarction without residual deficits: Secondary | ICD-10-CM | POA: Diagnosis not present

## 2018-11-13 DIAGNOSIS — J45909 Unspecified asthma, uncomplicated: Secondary | ICD-10-CM | POA: Diagnosis not present

## 2018-11-13 DIAGNOSIS — W57XXXA Bitten or stung by nonvenomous insect and other nonvenomous arthropods, initial encounter: Secondary | ICD-10-CM | POA: Diagnosis not present

## 2018-11-13 DIAGNOSIS — F17210 Nicotine dependence, cigarettes, uncomplicated: Secondary | ICD-10-CM | POA: Diagnosis not present

## 2018-11-13 MED ORDER — ACETAMINOPHEN 325 MG PO TABS
ORAL_TABLET | ORAL | Status: AC
  Filled 2018-11-13: qty 2

## 2018-11-13 MED ORDER — ACETAMINOPHEN 325 MG PO TABS
650.0000 mg | ORAL_TABLET | Freq: Once | ORAL | Status: AC
  Administered 2018-11-13: 650 mg via ORAL

## 2018-11-13 NOTE — ED Triage Notes (Signed)
Patient to ED screaming that she was bitten by a spider. Describes the spider as black. States she smacked it but not before it bit her. Has an area on the right wrist that does not appear as an insect bite.

## 2018-11-14 ENCOUNTER — Emergency Department
Admission: EM | Admit: 2018-11-14 | Discharge: 2018-11-14 | Disposition: A | Discharge status: Home / Self Care | Payer: Medicare Other | Attending: Emergency Medicine | Admitting: Emergency Medicine

## 2018-11-14 DIAGNOSIS — T63301A Toxic effect of unspecified spider venom, accidental (unintentional), initial encounter: Secondary | ICD-10-CM

## 2018-11-14 MED ORDER — CALAMINE EX LOTN
TOPICAL_LOTION | Freq: Two times a day (BID) | CUTANEOUS | Status: DC
  Administered 2018-11-14: 03:00:00 via TOPICAL
  Filled 2018-11-14: qty 177

## 2018-11-14 MED ORDER — DIPHENHYDRAMINE-ZINC ACETATE 2-0.1 % EX CREA: TOPICAL_CREAM | Freq: Once | CUTANEOUS | Status: DC

## 2018-11-14 NOTE — ED Notes (Signed)
Patient told the officer at the front desk that she "was fixing to have a seizure if she didn't get back there soon."

## 2018-11-14 NOTE — ED Provider Notes (Signed)
[facility] Emergency Department Provider Note  ____________________________________________  Time seen: Approximately 2:02 AM  I have reviewed the triage vital signs and the nursing notes.   HISTORY  Chief Complaint Insect Bite   HPI [name] is a 53 y.o. female with several chronic medical problems as listed below who presents for evaluation of an insect bite to her right wrist.  Patient reports that she was bitten by a black spider several hours ago.  Patient is complaining of moderate constant burning sensation around the bite site.  She denies any dizziness, diaphoresis, difficulty speaking, muscle cramping, restlessness.   Past Medical History:  Diagnosis Date  . Acute renal failure (ARF) (HCC) 01/07/2018  . Allergy   . Anemia   . Arthritis   . Asthma    NO INHALERS  . Bipolar disorder (HCC)   . Cocaine abuse in remission (HCC)   . Depression   . Dyspnea   . Eczema   . Nervous   . Schizophrenia (HCC)   . Scoliosis   . Seizure (HCC)   . Seizures (HCC)    Last seizure, May 20, 2016-PT WAS ALONE-UNSURE LENGTH OF SEIZURE-SCRAPED NOSE AND LIP  . Stroke [facility](HCC) 2007  . Tobacco use     Patient Active Problem List   Diagnosis Date Noted  . Lumbar radicular pain (left) 06/10/2018  . History of cocaine abuse (HCC) 06/10/2018  . Polysubstance abuse (HCC) 06/10/2018  . Acute renal failure (ARF) (HCC) 01/07/2018  . Bipolar disorder (HCC) 07/14/2017  . Somatic dysfunction of spine, lumbar 12/11/2016  . Somatic dysfunction of spine, thoracic 12/11/2016  . Low back pain 11/27/2016  . Spasm of muscle of lower back 11/27/2016  . Cyst of skin of breast 03/18/2016  . Eczema 05/16/2015  . Cocaine abuse in remission (HCC) 08/22/2014  . Seizure disorder (HCC) 08/21/2014    Past Surgical History:  Procedure Laterality Date  . APPENDECTOMY  2000  . BREAST CYST EXCISION Bilateral 05/29/2016   Procedure: MASS EXCISION CHEST WALL;  Surgeon:  Ricarda Frameharles Woodham, MD;  Location: [facility] ORS;  Service: General;  Laterality: Bilateral;  . DILATION AND CURETTAGE OF UTERUS      Prior to Admission medications   Medication Sig Start Date End Date Taking? Authorizing Provider  albuterol (VENTOLIN HFA) 108 (90 Base) MCG/ACT inhaler  08/11/18   [provider]  clotrimazole-betamethasone (LOTRISONE) cream APPLY TOPICALLY TWICE DAILY FOR 14 DAYS Patient not taking: Reported on 08/31/2018 08/11/18   Smitty CordsKaramalegos, Alexander J, DO  divalproex (DEPAKOTE) 500 MG DR tablet Take 1 tab in the morning and 2 tabs at night 04/29/18   Sharman CheekStafford, Phillip, MD  gabapentin (NEURONTIN) 100 MG capsule Take 1 capsule (100 mg total) by mouth 3 (three) times daily. Patient not taking: Reported on 08/31/2018 08/28/18 08/28/19  Nita SickleVeronese, Danube, MD  phenytoin (DILANTIN) 100 MG ER capsule Take 2 capsules (200 mg total) by mouth at bedtime. 04/29/18 06/30/19  Sharman CheekStafford, Phillip, MD    Allergies Flagyl [metronidazole]  Family History  Problem Relation Age of Onset  . Epilepsy Mother   . Seizures Mother   . Heart disease Father   . Epilepsy Maternal Grandmother   . Heart attack Maternal Grandmother     Social History Social History   Tobacco Use  . Smoking status: Current Every Day Smoker    Packs/day: 0.50    Years: 39.00    Pack years: 19.50    Types: Cigarettes  . Smokeless tobacco: Never Used  .  Tobacco comment: Interested in resources   Substance Use Topics  . Alcohol use: Yes    Alcohol/week: 0.0 standard drinks  . Drug use: Yes    Types: Cocaine    Review of Systems  Constitutional: Negative for fever. Eyes: Negative for visual changes. ENT: Negative for sore throat. Neck: No neck pain  Cardiovascular: Negative for chest pain. Respiratory: Negative for shortness of breath. Gastrointestinal: Negative for abdominal pain, vomiting or diarrhea. Genitourinary: Negative for dysuria. Musculoskeletal: Negative for back pain. Skin: Negative for  rash. + insect bite Neurological: Negative for headaches, weakness or numbness. Psych: No SI or HI  ____________________________________________   PHYSICAL EXAM:  VITAL SIGNS: ED Triage Vitals  Enc Vitals Group     BP 11/13/18 2346 121/78     Pulse Rate 11/13/18 2346 100     Resp 11/13/18 2346 20     Temp 11/13/18 2346 98.3 F (36.8 C)     Temp Source 11/13/18 2346 Oral     SpO2 --      Weight 11/13/18 2347 98 lb (44.5 kg)     Height 11/13/18 2347 5' (1.524 m)     Head Circumference --      Peak Flow --      Pain Score 11/13/18 2346 10     Pain Loc --      Pain Edu? --      Excl. in [place]? --     Constitutional: Alert and oriented. Well appearing and in no apparent distress. HEENT:      Head: Normocephalic and atraumatic.         Eyes: Conjunctivae are normal. Sclera is non-icteric.       Mouth/Throat: Mucous membranes are moist.       Neck: Supple with no signs of meningismus. Cardiovascular: Regular rate and rhythm.  Respiratory: Normal respiratory effort.  Gastrointestinal: Soft, non tender, ad non distended  Musculoskeletal: Nontender with normal range of motion in all extremities. No edema, cyanosis, or erythema of extremities. Small insect bite seen on the R distal forearm, compartments are soft, no muscle spasms, normal pulse and brisk capillary refill, normal neuro exam. Neurologic: Normal speech and language. Face is symmetric. Moving all extremities. No gross focal neurologic deficits are appreciated. Skin: Skin is warm, dry and intact. No rash noted. Psychiatric: Mood and affect are normal. Speech and behavior are normal.  ____________________________________________   LABS (all labs ordered are listed, but only abnormal results are displayed)  Labs Reviewed - No data to display ____________________________________________  EKG  none  ____________________________________________  RADIOLOGY  none  ____________________________________________    PROCEDURES  Procedure(s) performed: None Procedures Critical Care performed:  None ____________________________________________   INITIAL IMPRESSION / ASSESSMENT AND PLAN / ED COURSE   53 y.o. female with several chronic medical problems as listed below who presents for evaluation of a spider bite to her right wrist.  Patient is well-appearing and in no distress with normal vital signs, no systemic symptoms, no muscle cramping, no muscle spasms, no dizziness, no neuro deficits.  Patient has a small bite seen on the distal forearm with no swelling or redness, right upper extremity is warm and well-perfused with normal neurovascular exam.  Discussed wound care with patient.  Will give topical Benadryl cream for symptom relief.  Discussed my standard return precautions and follow-up with PCP.       As part of my medical decision making, I reviewed the following data within the [place] notes reviewed  and incorporated, Old chart reviewed, Notes from prior ED visits and [place] Controlled Substance Database   Patient was evaluated in Emergency Department today for the symptoms described in the history of present illness. Patient was evaluated in the context of the global COVID-19 pandemic, which necessitated consideration that the patient might be at risk for infection with the SARS-CoV-2 virus that causes COVID-19. Institutional protocols and algorithms that pertain to the evaluation of patients at risk for COVID-19 are in a state of rapid change based on information released by regulatory bodies including the CDC and federal and state organizations. These policies and algorithms were followed during the patient's care in the ED.   ____________________________________________   FINAL CLINICAL IMPRESSION(S) / ED DIAGNOSES   Final diagnoses:  Spider bite wound, accidental or unintentional, initial encounter      NEW MEDICATIONS STARTED DURING THIS VISIT:  ED  Discharge Orders    None       Note:  This document was prepared using Dragon voice recognition software and may include unintentional dictation errors.    Nita Sickle, MD 11/14/18 [phone]

## 2018-12-26 ENCOUNTER — Other Ambulatory Visit: Payer: Self-pay | Admitting: Nurse Practitioner

## 2018-12-26 DIAGNOSIS — B354 Tinea corporis: Secondary | ICD-10-CM

## 2019-02-03 ENCOUNTER — Other Ambulatory Visit: Payer: Self-pay | Admitting: Nurse Practitioner

## 2019-02-03 DIAGNOSIS — B354 Tinea corporis: Secondary | ICD-10-CM

## 2019-02-18 ENCOUNTER — Ambulatory Visit: Payer: Medicare Other | Admitting: Physician Assistant

## 2019-03-03 ENCOUNTER — Other Ambulatory Visit: Payer: Self-pay | Admitting: Family Medicine

## 2019-03-03 DIAGNOSIS — B354 Tinea corporis: Secondary | ICD-10-CM

## 2019-04-26 ENCOUNTER — Other Ambulatory Visit: Payer: Self-pay | Admitting: Family Medicine

## 2019-04-26 DIAGNOSIS — B354 Tinea corporis: Secondary | ICD-10-CM

## 2019-07-15 ENCOUNTER — Telehealth: Payer: Self-pay

## 2019-07-15 NOTE — Telephone Encounter (Signed)
Unsuccessful attempt at calling patient to notify them that it is time to schedule the low dose lung cancer screening CT scan.  Contact number listed not in service.

## 2019-07-30 ENCOUNTER — Telehealth: Payer: Self-pay | Admitting: *Deleted

## 2019-07-30 NOTE — Telephone Encounter (Signed)
Attempted to notify this patient that her Low Dose Lung Cancer CT is due. Her number on file is not in service. I called her emergency contact and left a message for her to call with information to schedule her Ct.

## 2019-08-19 ENCOUNTER — Other Ambulatory Visit: Payer: Self-pay | Admitting: Family Medicine

## 2019-08-19 DIAGNOSIS — B354 Tinea corporis: Secondary | ICD-10-CM

## 2019-08-19 NOTE — Telephone Encounter (Signed)
Requested  medications are  due for refill today Yes  Requested medications are on the active medication list Yes  Last refill 3/23  Last visit for this condition was 17 months ago and it was ordered for 14 days  Future visit scheduled NO  Notes to clinic There is no protocol for this medication, please review.

## 2019-11-03 ENCOUNTER — Other Ambulatory Visit: Payer: Self-pay | Admitting: Family Medicine

## 2019-11-03 DIAGNOSIS — B354 Tinea corporis: Secondary | ICD-10-CM

## 2019-11-03 NOTE — Telephone Encounter (Signed)
Requested medication (s) are due for refill today: Yes  Requested medication (s) are on the active medication list: Yes  Last refill:  2 months ago  Future visit scheduled: No  Notes to clinic: Unable to refill per protocol, medication not assigned to protocol, OV needed     Requested Prescriptions  Pending Prescriptions Disp Refills   clotrimazole-betamethasone (LOTRISONE) cream [Pharmacy Med Name: CLOTRIMAZOLE-BETAMETHASONE 1-0.05%] 45 g 0    Sig: APPLY TOPICALLY TWICE DAILY FOR 14 DAYS      Off-Protocol Failed - 11/03/2019  6:19 PM      Failed - Medication not assigned to a protocol, review manually.      Failed - Valid encounter within last 12 months    Recent Outpatient Visits           1 year ago Herpes zoster without complication   [facility] Galen Manila, NP   1 year ago Follow up   [facility] Kyung Rudd, Alison Stalling, NP   1 year ago Tinea corporis   [facility] Kyung Rudd, Alison Stalling, NP

## 2019-11-12 ENCOUNTER — Encounter: Payer: Self-pay | Admitting: *Deleted

## 2019-11-16 DIAGNOSIS — M2012 Hallux valgus (acquired), left foot: Secondary | ICD-10-CM | POA: Diagnosis not present

## 2019-11-16 DIAGNOSIS — M2011 Hallux valgus (acquired), right foot: Secondary | ICD-10-CM | POA: Diagnosis not present

## 2019-11-26 DIAGNOSIS — G4700 Insomnia, unspecified: Secondary | ICD-10-CM | POA: Diagnosis not present

## 2019-12-14 ENCOUNTER — Other Ambulatory Visit: Payer: Self-pay | Admitting: Family Medicine

## 2019-12-14 DIAGNOSIS — B354 Tinea corporis: Secondary | ICD-10-CM

## 2019-12-14 NOTE — Telephone Encounter (Signed)
Requested medication (s) are due for refill today: yes  Requested medication (s) are on the active medication list: yes  Last refill:  08/20/19  Future visit scheduled: no  Notes to clinic:  no assigned protocol    Requested Prescriptions  Pending Prescriptions Disp Refills   clotrimazole-betamethasone (LOTRISONE) cream [Pharmacy Med Name: CLOTRIMAZOLE-BETAMETHASONE 1-0.05%] 45 g 0    Sig: APPLY TOPICALLY TWICE DAILY FOR 14 DAYS      Off-Protocol Failed - 12/14/2019 11:57 AM      Failed - Medication not assigned to a protocol, review manually.      Failed - Valid encounter within last 12 months    Recent Outpatient Visits           1 year ago Herpes zoster without complication   [facility] Galen Manila, NP   1 year ago Follow up   [facility] Kyung Rudd, Alison Stalling, NP   1 year ago Tinea corporis   [facility] Kyung Rudd, Alison Stalling, NP

## 2019-12-28 ENCOUNTER — Other Ambulatory Visit: Payer: Self-pay | Admitting: Family Medicine

## 2019-12-28 DIAGNOSIS — B354 Tinea corporis: Secondary | ICD-10-CM

## 2020-01-26 ENCOUNTER — Other Ambulatory Visit: Payer: Self-pay

## 2020-01-26 ENCOUNTER — Emergency Department: Payer: Medicare Other

## 2020-01-26 ENCOUNTER — Emergency Department
Admission: EM | Admit: 2020-01-26 | Discharge: 2020-01-26 | Disposition: A | Discharge status: Home / Self Care | Payer: Medicare Other | Attending: Emergency Medicine | Admitting: Emergency Medicine

## 2020-01-26 DIAGNOSIS — S4991XA Unspecified injury of right shoulder and upper arm, initial encounter: Secondary | ICD-10-CM | POA: Insufficient documentation

## 2020-01-26 DIAGNOSIS — Z5321 Procedure and treatment not carried out due to patient leaving prior to being seen by health care provider: Secondary | ICD-10-CM | POA: Insufficient documentation

## 2020-01-26 DIAGNOSIS — X500XXA Overexertion from strenuous movement or load, initial encounter: Secondary | ICD-10-CM | POA: Diagnosis not present

## 2020-01-26 DIAGNOSIS — R569 Unspecified convulsions: Secondary | ICD-10-CM | POA: Insufficient documentation

## 2020-01-26 DIAGNOSIS — M25511 Pain in right shoulder: Secondary | ICD-10-CM | POA: Diagnosis not present

## 2020-01-26 LAB — PHENYTOIN LEVEL, TOTAL: Phenytoin Lvl: <2.5 ug/mL — ABNORMAL LOW (ref 10.0–20.0)

## 2020-01-26 LAB — CBC
HCT: 37 % (ref 36.0–46.0)
Hemoglobin: 13 g/dL (ref 12.0–15.0)
MCH: 31.7 pg (ref 26.0–34.0)
MCHC: 35.1 g/dL (ref 30.0–36.0)
MCV: 90.2 fL (ref 80.0–100.0)
Platelets: 174 10*3/uL (ref 150–400)
RBC: 4.1 MIL/uL (ref 3.87–5.11)
RDW: 13 % (ref 11.5–15.5)
WBC: 6 10*3/uL (ref 4.0–10.5)
nRBC: 0 % (ref 0.0–0.2)

## 2020-01-26 LAB — BASIC METABOLIC PANEL
Anion gap: 10 (ref 5–15)
BUN: 5 mg/dL — ABNORMAL LOW (ref 6–20)
CO2: 22 mmol/L (ref 22–32)
Calcium: 9.5 mg/dL (ref 8.9–10.3)
Chloride: 105 mmol/L (ref 98–111)
Creatinine, Ser: 0.51 mg/dL (ref 0.44–1.00)
GFR, Estimated: >60 mL/min (ref >60)
Glucose, Bld: 85 mg/dL (ref 70–99)
Potassium: 4.8 mmol/L (ref 3.5–5.1)
Sodium: 137 mmol/L (ref 135–145)

## 2020-01-26 LAB — VALPROIC ACID LEVEL: Valproic Acid Lvl: <10 ug/mL — ABNORMAL LOW (ref 50.0–100.0)

## 2020-01-26 NOTE — ED Triage Notes (Signed)
PT to ED via POV c/o seziure around 4:30. PT takes dilantin and depakote but has not been able to take them regularly as of late. Last dose night before last per pt. PT states she was sitting down, unknown how long seizure lasted. Did not bite tongue but states she urinated a little. PT states also yesterday she lifted a heavy box and is now having trouble moving her shoulder.

## 2020-01-26 NOTE — ED Notes (Signed)
PT noted to have L side weakness but states that is normal since her last stroke.

## 2020-02-08 DIAGNOSIS — M2011 Hallux valgus (acquired), right foot: Secondary | ICD-10-CM | POA: Diagnosis not present

## 2020-02-10 DIAGNOSIS — H04123 Dry eye syndrome of bilateral lacrimal glands: Secondary | ICD-10-CM | POA: Diagnosis not present

## 2020-02-22 ENCOUNTER — Other Ambulatory Visit: Payer: Self-pay | Admitting: Family Medicine

## 2020-02-22 DIAGNOSIS — B354 Tinea corporis: Secondary | ICD-10-CM

## 2020-02-22 NOTE — Telephone Encounter (Signed)
Requested medication (s) are due for refill today: yes  Requested medication (s) are on the active medication list: yes  Last refill:  08/20/19  Future visit scheduled: no  Notes to clinic:  Please review for refill. Unable to refill per protocol    Requested Prescriptions  Pending Prescriptions Disp Refills   clotrimazole-betamethasone (LOTRISONE) cream [Pharmacy Med Name: CLOTRIMAZOLE-BETAMETHASONE 1-0.05%] 45 g 0    Sig: APPLY TOPICALLY TWICE DAILY FOR 14 DAYS      Off-Protocol Failed - 02/22/2020 10:21 AM      Failed - Medication not assigned to a protocol, review manually.      Failed - Valid encounter within last 12 months    Recent Outpatient Visits           1 year ago Herpes zoster without complication   [facility] Galen Manila, NP   1 year ago Follow up   [facility] Kyung Rudd, Alison Stalling, NP   1 year ago Tinea corporis   [facility] Kyung Rudd, Alison Stalling, NP

## 2020-02-26 ENCOUNTER — Other Ambulatory Visit: Payer: Self-pay | Admitting: Family Medicine

## 2020-02-26 DIAGNOSIS — B354 Tinea corporis: Secondary | ICD-10-CM

## 2020-03-02 DIAGNOSIS — M79671 Pain in right foot: Secondary | ICD-10-CM | POA: Diagnosis not present

## 2020-03-02 DIAGNOSIS — M2011 Hallux valgus (acquired), right foot: Secondary | ICD-10-CM | POA: Diagnosis not present

## 2020-03-17 DIAGNOSIS — G4700 Insomnia, unspecified: Secondary | ICD-10-CM | POA: Diagnosis not present

## 2020-03-17 DIAGNOSIS — F411 Generalized anxiety disorder: Secondary | ICD-10-CM | POA: Diagnosis not present

## 2020-03-17 DIAGNOSIS — F251 Schizoaffective disorder, depressive type: Secondary | ICD-10-CM | POA: Diagnosis not present

## 2020-03-30 DIAGNOSIS — Z9181 History of falling: Secondary | ICD-10-CM | POA: Diagnosis not present

## 2020-03-30 DIAGNOSIS — Z1159 Encounter for screening for other viral diseases: Secondary | ICD-10-CM | POA: Diagnosis not present

## 2020-03-30 DIAGNOSIS — H4322 Crystalline deposits in vitreous body, left eye: Secondary | ICD-10-CM | POA: Diagnosis not present

## 2020-03-30 DIAGNOSIS — F0280 Dementia in other diseases classified elsewhere without behavioral disturbance: Secondary | ICD-10-CM | POA: Diagnosis not present

## 2020-03-30 DIAGNOSIS — D649 Anemia, unspecified: Secondary | ICD-10-CM | POA: Diagnosis not present

## 2020-03-30 DIAGNOSIS — F315 Bipolar disorder, current episode depressed, severe, with psychotic features: Secondary | ICD-10-CM | POA: Diagnosis not present

## 2020-03-30 DIAGNOSIS — Z1389 Encounter for screening for other disorder: Secondary | ICD-10-CM | POA: Diagnosis not present

## 2020-03-30 DIAGNOSIS — J449 Chronic obstructive pulmonary disease, unspecified: Secondary | ICD-10-CM | POA: Diagnosis not present

## 2020-03-30 DIAGNOSIS — H04123 Dry eye syndrome of bilateral lacrimal glands: Secondary | ICD-10-CM | POA: Diagnosis not present

## 2020-03-30 DIAGNOSIS — R569 Unspecified convulsions: Secondary | ICD-10-CM | POA: Diagnosis not present

## 2020-03-30 DIAGNOSIS — R636 Underweight: Secondary | ICD-10-CM | POA: Diagnosis not present

## 2020-04-03 DIAGNOSIS — Z0100 Encounter for examination of eyes and vision without abnormal findings: Secondary | ICD-10-CM | POA: Diagnosis not present

## 2020-04-04 ENCOUNTER — Other Ambulatory Visit: Payer: Self-pay | Admitting: Family Medicine

## 2020-04-04 DIAGNOSIS — Z1231 Encounter for screening mammogram for malignant neoplasm of breast: Secondary | ICD-10-CM

## 2020-04-20 DIAGNOSIS — F0150 Vascular dementia without behavioral disturbance: Secondary | ICD-10-CM | POA: Diagnosis not present

## 2020-04-20 DIAGNOSIS — J449 Chronic obstructive pulmonary disease, unspecified: Secondary | ICD-10-CM | POA: Diagnosis not present

## 2020-04-20 DIAGNOSIS — D72829 Elevated white blood cell count, unspecified: Secondary | ICD-10-CM | POA: Diagnosis not present

## 2020-04-20 DIAGNOSIS — N179 Acute kidney failure, unspecified: Secondary | ICD-10-CM | POA: Diagnosis not present

## 2020-04-20 DIAGNOSIS — D649 Anemia, unspecified: Secondary | ICD-10-CM | POA: Diagnosis not present

## 2020-05-17 ENCOUNTER — Other Ambulatory Visit: Payer: Self-pay | Admitting: *Deleted

## 2020-05-17 DIAGNOSIS — Z122 Encounter for screening for malignant neoplasm of respiratory organs: Secondary | ICD-10-CM

## 2020-05-17 DIAGNOSIS — F17200 Nicotine dependence, unspecified, uncomplicated: Secondary | ICD-10-CM

## 2020-05-17 DIAGNOSIS — Z87891 Personal history of nicotine dependence: Secondary | ICD-10-CM

## 2020-05-17 NOTE — Progress Notes (Signed)
Contacted and scheduled annual lung screening scan with [facility] coordinator Kia.   Patient is a current smoker with a 61.5 pack year history.

## 2020-05-22 ENCOUNTER — Other Ambulatory Visit: Payer: Self-pay

## 2020-05-22 ENCOUNTER — Emergency Department
Admission: EM | Admit: 2020-05-22 | Discharge: 2020-05-22 | Disposition: A | Discharge status: Home / Self Care | Payer: Medicare (Managed Care) | Attending: Emergency Medicine | Admitting: Emergency Medicine

## 2020-05-22 ENCOUNTER — Emergency Department: Payer: Medicare (Managed Care)

## 2020-05-22 DIAGNOSIS — Z5321 Procedure and treatment not carried out due to patient leaving prior to being seen by health care provider: Secondary | ICD-10-CM | POA: Diagnosis not present

## 2020-05-22 DIAGNOSIS — R519 Headache, unspecified: Secondary | ICD-10-CM | POA: Insufficient documentation

## 2020-05-22 DIAGNOSIS — R0789 Other chest pain: Secondary | ICD-10-CM | POA: Insufficient documentation

## 2020-05-22 LAB — BASIC METABOLIC PANEL
Anion gap: 8 (ref 5–15)
BUN: 10 mg/dL (ref 6–20)
CO2: 24 mmol/L (ref 22–32)
Calcium: 9.4 mg/dL (ref 8.9–10.3)
Chloride: 105 mmol/L (ref 98–111)
Creatinine, Ser: 0.63 mg/dL (ref 0.44–1.00)
GFR, Estimated: >60 mL/min (ref >60)
Glucose, Bld: 83 mg/dL (ref 70–99)
Potassium: 3.9 mmol/L (ref 3.5–5.1)
Sodium: 137 mmol/L (ref 135–145)

## 2020-05-22 LAB — CBC
HCT: 35.9 % — ABNORMAL LOW (ref 36.0–46.0)
Hemoglobin: 12.4 g/dL (ref 12.0–15.0)
MCH: 31.6 pg (ref 26.0–34.0)
MCHC: 34.5 g/dL (ref 30.0–36.0)
MCV: 91.3 fL (ref 80.0–100.0)
Platelets: 184 10*3/uL (ref 150–400)
RBC: 3.93 MIL/uL (ref 3.87–5.11)
RDW: 13 % (ref 11.5–15.5)
WBC: 5.9 10*3/uL (ref 4.0–10.5)
nRBC: 0 % (ref 0.0–0.2)

## 2020-05-22 LAB — TROPONIN I (HIGH SENSITIVITY): Troponin I (High Sensitivity): 4 ng/L (ref <18)

## 2020-05-22 NOTE — ED Triage Notes (Signed)
Pt c/o headache since this morning, pt states she is from [facility] and told staff she had headache and chest tightness but staff did nothing about it so pt called 911. Pt states she is also having some sob. Pt has hx seizures.

## 2020-05-22 NOTE — ED Notes (Signed)
Pt reports leaving ED prior to being seen by provider.

## 2020-05-31 ENCOUNTER — Ambulatory Visit: Admission: RE | Admit: 2020-05-31 | Payer: Medicare (Managed Care) | Source: Ambulatory Visit

## 2020-06-15 DIAGNOSIS — G4489 Other headache syndrome: Secondary | ICD-10-CM | POA: Diagnosis not present

## 2020-06-15 DIAGNOSIS — F39 Unspecified mood [affective] disorder: Secondary | ICD-10-CM | POA: Diagnosis not present

## 2020-06-15 DIAGNOSIS — R569 Unspecified convulsions: Secondary | ICD-10-CM | POA: Diagnosis not present

## 2020-06-28 ENCOUNTER — Other Ambulatory Visit: Payer: Self-pay

## 2020-06-28 ENCOUNTER — Ambulatory Visit
Admission: RE | Admit: 2020-06-28 | Discharge: 2020-06-28 | Disposition: A | Discharge status: Home / Self Care | Payer: Medicare HMO | Source: Ambulatory Visit | Attending: Oncology | Admitting: Oncology

## 2020-06-28 DIAGNOSIS — Z87891 Personal history of nicotine dependence: Secondary | ICD-10-CM | POA: Insufficient documentation

## 2020-06-28 DIAGNOSIS — Z122 Encounter for screening for malignant neoplasm of respiratory organs: Secondary | ICD-10-CM | POA: Diagnosis not present

## 2020-06-28 DIAGNOSIS — F17200 Nicotine dependence, unspecified, uncomplicated: Secondary | ICD-10-CM | POA: Diagnosis not present

## 2020-06-28 DIAGNOSIS — F17210 Nicotine dependence, cigarettes, uncomplicated: Secondary | ICD-10-CM | POA: Diagnosis not present

## 2020-07-05 ENCOUNTER — Encounter: Payer: Self-pay | Admitting: *Deleted

## 2020-09-08 ENCOUNTER — Emergency Department
Admission: EM | Admit: 2020-09-08 | Discharge: 2020-09-13 | Disposition: A | Discharge status: Home / Self Care | Payer: Medicare Other | Attending: Emergency Medicine | Admitting: Emergency Medicine

## 2020-09-08 DIAGNOSIS — F17210 Nicotine dependence, cigarettes, uncomplicated: Secondary | ICD-10-CM | POA: Diagnosis not present

## 2020-09-08 DIAGNOSIS — J45909 Unspecified asthma, uncomplicated: Secondary | ICD-10-CM | POA: Diagnosis not present

## 2020-09-08 DIAGNOSIS — R569 Unspecified convulsions: Secondary | ICD-10-CM | POA: Diagnosis not present

## 2020-09-08 DIAGNOSIS — Z5329 Procedure and treatment not carried out because of patient's decision for other reasons: Secondary | ICD-10-CM

## 2020-09-08 LAB — COMPREHENSIVE METABOLIC PANEL
ALT: 85 U/L — ABNORMAL HIGH (ref 0–44)
AST: 137 U/L — ABNORMAL HIGH (ref 15–41)
Albumin: 4.5 g/dL (ref 3.5–5.0)
Alkaline Phosphatase: 71 U/L (ref 38–126)
Anion gap: 9 (ref 5–15)
BUN: 8 mg/dL (ref 6–20)
CO2: 20 mmol/L — ABNORMAL LOW (ref 22–32)
Calcium: 8.8 mg/dL — ABNORMAL LOW (ref 8.9–10.3)
Chloride: 105 mmol/L (ref 98–111)
Creatinine, Ser: 0.55 mg/dL (ref 0.44–1.00)
GFR, Estimated: >60 mL/min (ref >60)
Glucose, Bld: 95 mg/dL (ref 70–99)
Potassium: 3.9 mmol/L (ref 3.5–5.1)
Sodium: 134 mmol/L — ABNORMAL LOW (ref 135–145)
Total Bilirubin: 0.7 mg/dL (ref 0.3–1.2)
Total Protein: 7.7 g/dL (ref 6.5–8.1)

## 2020-09-08 LAB — CBC WITH DIFFERENTIAL/PLATELET
Abs Immature Granulocytes: 0.02 10*3/uL (ref 0.00–0.07)
Basophils Absolute: 0 10*3/uL (ref 0.0–0.1)
Basophils Relative: 0 %
Eosinophils Absolute: 0 10*3/uL (ref 0.0–0.5)
Eosinophils Relative: 1 %
HCT: 36.4 % (ref 36.0–46.0)
Hemoglobin: 12.8 g/dL (ref 12.0–15.0)
Immature Granulocytes: 0 %
Lymphocytes Relative: 29 %
Lymphs Abs: 1.4 10*3/uL (ref 0.7–4.0)
MCH: 32.1 pg (ref 26.0–34.0)
MCHC: 35.2 g/dL (ref 30.0–36.0)
MCV: 91.2 fL (ref 80.0–100.0)
Monocytes Absolute: 0.4 10*3/uL (ref 0.1–1.0)
Monocytes Relative: 8 %
Neutro Abs: 2.9 10*3/uL (ref 1.7–7.7)
Neutrophils Relative %: 62 %
Platelets: 149 10*3/uL — ABNORMAL LOW (ref 150–400)
RBC: 3.99 MIL/uL (ref 3.87–5.11)
RDW: 12.8 % (ref 11.5–15.5)
WBC: 4.8 10*3/uL (ref 4.0–10.5)
nRBC: 0 % (ref 0.0–0.2)

## 2020-09-08 LAB — PHENYTOIN LEVEL, TOTAL: Phenytoin Lvl: <2.5 ug/mL — ABNORMAL LOW (ref 10.0–20.0)

## 2020-09-08 LAB — VALPROIC ACID LEVEL: Valproic Acid Lvl: <10 ug/mL — ABNORMAL LOW (ref 50.0–100.0)

## 2020-09-08 NOTE — ED Notes (Signed)
Blood specimens collected by RN Florentina Addison for right hand; labeled sent to lab

## 2020-09-08 NOTE — ED Triage Notes (Signed)
Family called EMS to home for multiple seizures; upon arrival patient A/O x4; skin warm/ dry; respirations even non-labored; smells of ETOH

## 2020-09-08 NOTE — ED Notes (Signed)
Patient refuses cardiac monitoring at this time; yelling and swinging arms irratically

## 2020-09-08 NOTE — ED Provider Notes (Signed)
[facility] Emergency Department Provider Note  ____________________________________________   Event Date/Time   First MD Initiated Contact with Patient 09/08/20 0225     (approximate)  I have reviewed the triage vital signs and the nursing notes.   HISTORY  Chief Complaint Seizures    HPI [name] is a 55 y.o. female with past medical history as below here with seizure like activity.  Patient arrives via EMS.  She reportedly had a witnessed seizure-like activity.  He has a history of recurrent seizures and on my assessment, states she has not been taking her medications because she was somewhat unclear what she should be taking.  She is somewhat erratic on history taking, but states that she does not take anything because she has gotten 2 different pill packs from 2 pharmacies and is unclear what she should take.  She is very agitated about being in the ED and is requesting to leave.  She admits to fairly regular alcohol use but not any more than usual.  She denies any other complaints.  Denies any head trauma.  No numbness or weakness.  No other complaints.    Past Medical History:  Diagnosis Date   Acute renal failure (ARF) (HCC) 01/07/2018   Allergy    Anemia    Arthritis    Asthma    NO INHALERS   Bipolar disorder (HCC)    Cocaine abuse in remission (HCC)    Depression    Dyspnea    Eczema    Nervous    Schizophrenia (HCC)    Scoliosis    Seizure (HCC)    Seizures (HCC)    Last seizure, May 20, 2016-PT WAS ALONE-UNSURE LENGTH OF SEIZURE-SCRAPED NOSE AND LIP   Stroke (HCC) 2007   Tobacco use     Patient Active Problem List   Diagnosis Date Noted   Lumbar radicular pain (left) 06/10/2018   History of cocaine abuse (HCC) 06/10/2018   Polysubstance abuse (HCC) 06/10/2018   Acute renal failure (ARF) (HCC) 01/07/2018   Bipolar disorder (HCC) 07/14/2017   Somatic dysfunction of spine, lumbar 12/11/2016   Somatic dysfunction of  spine, thoracic 12/11/2016   Low back pain 11/27/2016   Spasm of muscle of lower back 11/27/2016   Cyst of skin of breast 03/18/2016   Eczema 05/16/2015   Cocaine abuse in remission (HCC) 08/22/2014   Seizure disorder (HCC) 08/21/2014    Past Surgical History:  Procedure Laterality Date   APPENDECTOMY  2000   BREAST CYST EXCISION Bilateral 05/29/2016   Procedure: MASS EXCISION CHEST WALL;  Surgeon: Ricarda Frame, MD;  Location: [facility] ORS;  Service: General;  Laterality: Bilateral;   DILATION AND CURETTAGE OF UTERUS      Prior to Admission medications   Medication Sig Start Date End Date Taking? Authorizing Provider  albuterol (VENTOLIN HFA) 108 (90 Base) MCG/ACT inhaler  08/11/18   [provider]  clotrimazole-betamethasone (LOTRISONE) cream APPLY TOPICALLY TWICE DAILY FOR 14 DAYS 08/20/19   Malfi, Jodelle Gross, FNP  divalproex (DEPAKOTE) 500 MG DR tablet Take 1 tab in the morning and 2 tabs at night 04/29/18   Sharman Cheek, MD  gabapentin (NEURONTIN) 100 MG capsule Take 1 capsule (100 mg total) by mouth 3 (three) times daily. Patient not taking: Reported on 08/31/2018 08/28/18 08/28/19  Nita Sickle, MD  phenytoin (DILANTIN) 100 MG ER capsule Take 2 capsules (200 mg total) by mouth at bedtime. 04/29/18 06/30/19  Sharman Cheek, MD    Allergies  Flagyl [metronidazole]  Family History  Problem Relation Age of Onset   Epilepsy Mother    Seizures Mother    Heart disease Father    Epilepsy Maternal Grandmother    Heart attack Maternal Grandmother     Social History Social History   Tobacco Use   Smoking status: Every Day    Packs/day: 0.25    Years: 39.00    Pack years: 9.75    Types: Cigarettes   Smokeless tobacco: Never   Tobacco comments:    Interested in Museum/gallery exhibitions officer Use: Never used  Substance Use Topics   Alcohol use: Yes    Alcohol/week: 0.0 standard drinks    Comment: "sometimes"    Drug use: Yes    Types: Cocaine    Review  of Systems  Review of Systems  Constitutional:  Positive for fatigue. Negative for chills and fever.  HENT:  Negative for sore throat.   Respiratory:  Negative for shortness of breath.   Cardiovascular:  Negative for chest pain.  Gastrointestinal:  Negative for abdominal pain.  Genitourinary:  Negative for flank pain.  Musculoskeletal:  Negative for neck pain.  Skin:  Negative for rash and wound.  Allergic/Immunologic: Negative for immunocompromised state.  Neurological:  Positive for seizures. Negative for weakness and numbness.  Hematological:  Does not bruise/bleed easily.  All other systems reviewed and are negative.   ____________________________________________  PHYSICAL EXAM:      VITAL SIGNS: ED Triage Vitals  Enc Vitals Group     BP 09/08/20 0223 107/80     Pulse Rate 09/08/20 0223 (!) 107     Resp 09/08/20 0223 18     Temp 09/08/20 0223 98.6 F (37 C)     Temp Source 09/08/20 0223 Oral     SpO2 09/08/20 0223 94 %     Weight 09/08/20 0224 105 lb (47.6 kg)     Height 09/08/20 0224 5\' 5"  (1.651 m)     Head Circumference --      Peak Flow --      Pain Score --      Pain Loc --      Pain Edu? --      Excl. in [place]? --      Physical Exam Vitals and nursing note reviewed.  Constitutional:      General: She is not in acute distress.    Appearance: She is well-developed.  HENT:     Head: Normocephalic and atraumatic.  Eyes:     Conjunctiva/sclera: Conjunctivae normal.  Cardiovascular:     Rate and Rhythm: Normal rate and regular rhythm.     Heart sounds: Normal heart sounds.  Pulmonary:     Effort: Pulmonary effort is normal. No respiratory distress.     Breath sounds: No wheezing.  Abdominal:     General: There is no distension.  Musculoskeletal:     Cervical back: Neck supple.  Skin:    General: Skin is warm.     Capillary Refill: Capillary refill takes less than 2 seconds.     Findings: No rash.  Neurological:     Mental Status: She is alert and  oriented to person, place, and time.     Motor: No abnormal muscle tone.      ____________________________________________   LABS (all labs ordered are listed, but only abnormal results are displayed)  Labs Reviewed  CBC WITH DIFFERENTIAL/PLATELET - Abnormal; Notable for the following components:  Result Value   Platelets 149 (*)    All other components within normal limits  COMPREHENSIVE METABOLIC PANEL - Abnormal; Notable for the following components:   Sodium 134 (*)    CO2 20 (*)    Calcium 8.8 (*)    AST 137 (*)    ALT 85 (*)    All other components within normal limits  PHENYTOIN LEVEL, TOTAL  VALPROIC ACID LEVEL    ____________________________________________  EKG:  ________________________________________  RADIOLOGY All imaging, including plain films, CT scans, and ultrasounds, independently reviewed by me, and interpretations confirmed via formal radiology reads.  ED MD interpretation:     Official radiology report(s): No results found.  ____________________________________________  PROCEDURES   Procedure(s) performed (including Critical Care):  Procedures  ____________________________________________  INITIAL IMPRESSION / MDM / ASSESSMENT AND PLAN / ED COURSE  As part of my medical decision making, I reviewed the following data within the electronic MEDICAL RECORD NUMBER Nursing notes reviewed and incorporated, Old chart reviewed, Notes from prior ED visits, and [place] Controlled Substance Database       *Raizy A Harwick was evaluated in Emergency Department on 09/08/2020 for the symptoms described in the history of present illness. She was evaluated in the context of the global COVID-19 pandemic, which necessitated consideration that the patient might be at risk for infection with the SARS-CoV-2 virus that causes COVID-19. Institutional protocols and algorithms that pertain to the evaluation of patients at risk for COVID-19 are in a state of rapid change  based on information released by regulatory bodies including the CDC and federal and state organizations. These policies and algorithms were followed during the patient's care in the ED.  Some ED evaluations and interventions may be delayed as a result of limited staffing during the pandemic.*     Medical Decision Making: 55 year old female here with witnessed seizure-like activity.  Suspect breakthrough seizure in setting of medication nonadherence.  Patient also admits to fairly regular alcohol use which could be contributing.  Patient is very agitated on arrival, angry that nursing had to place an IV.  She ripped this out shortly thereafter.  Patient initially refusing treatment but I was able to have a long conversation with her discussing the need for work-up, as well as likely load of antiepileptics.  I had a long discussion with her regarding the importance of taking her antiepileptics, and she initially seemed amenable to receiving these as well as trying to work out what she should and should not be on.  However, shortly thereafter, she left AMA.  She seemed agitated but was able to answer questions for me appropriately, as well as understand the reason that she was here and the need for her medications.  She did not appear significantly, clinically intoxicated.  Screening lab work that had returned at the time, reviewed and is unremarkable.  AST and ALT elevation likely from alcohol use.  CBC unremarkable.  Depakote and phenytoin levels pending.  ____________________________________________  FINAL CLINICAL IMPRESSION(S) / ED DIAGNOSES  Final diagnoses:  Seizure (HCC)  Left against medical advice     MEDICATIONS GIVEN DURING THIS VISIT:  Medications - No data to display   ED Discharge Orders     None        Note:  This document was prepared using Dragon voice recognition software and may include unintentional dictation errors.   Shaune Pollack, MD 09/08/20 [phone]

## 2020-09-17 IMAGING — CR DG LUMBAR SPINE 2-3V
3 series · 3 of 3 positions shown · non-contrast
Comparison: 01/07/2018.

CLINICAL DATA: Back and left leg pain.

EXAM:
LUMBAR SPINE - 2-3 VIEW

[l-spine ap]
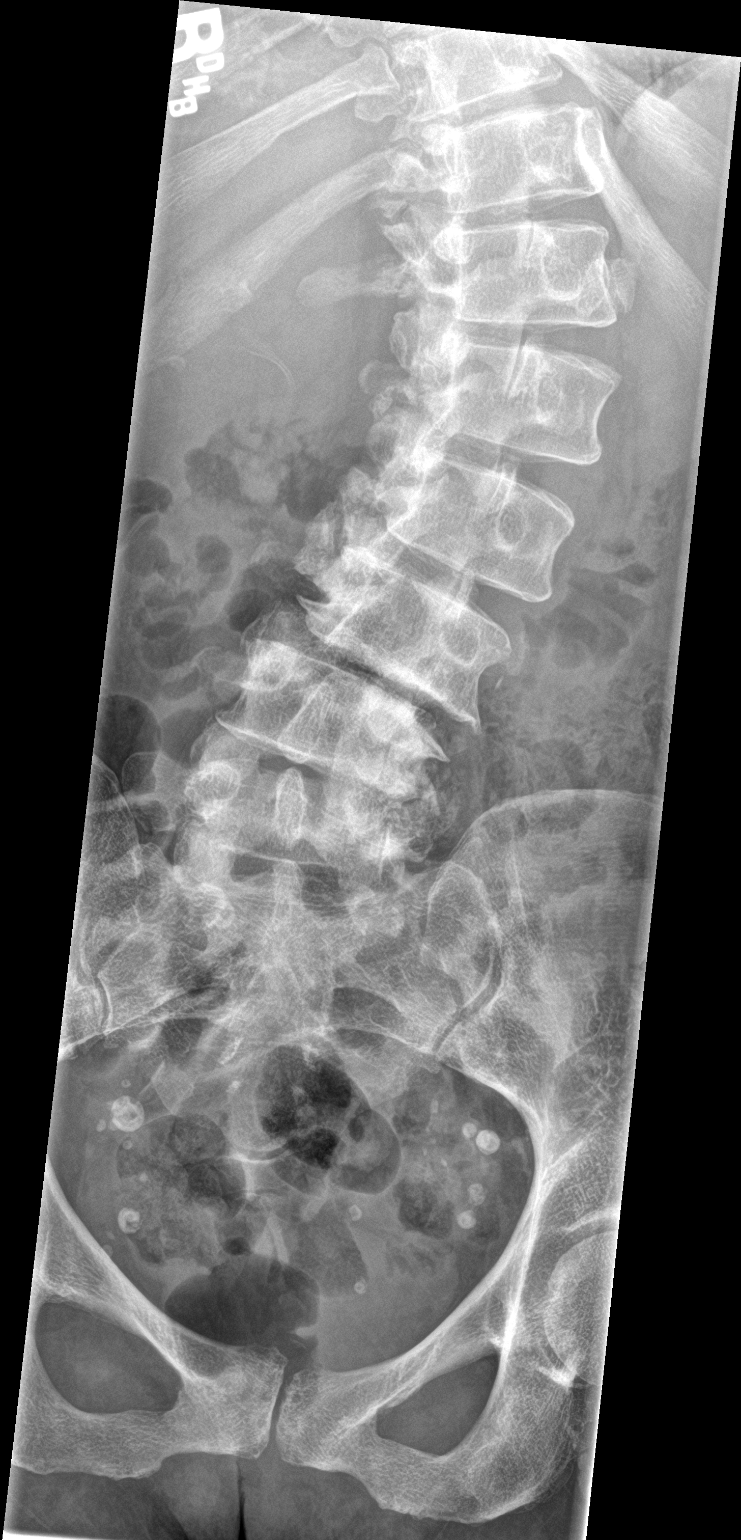

[l-spine lat]
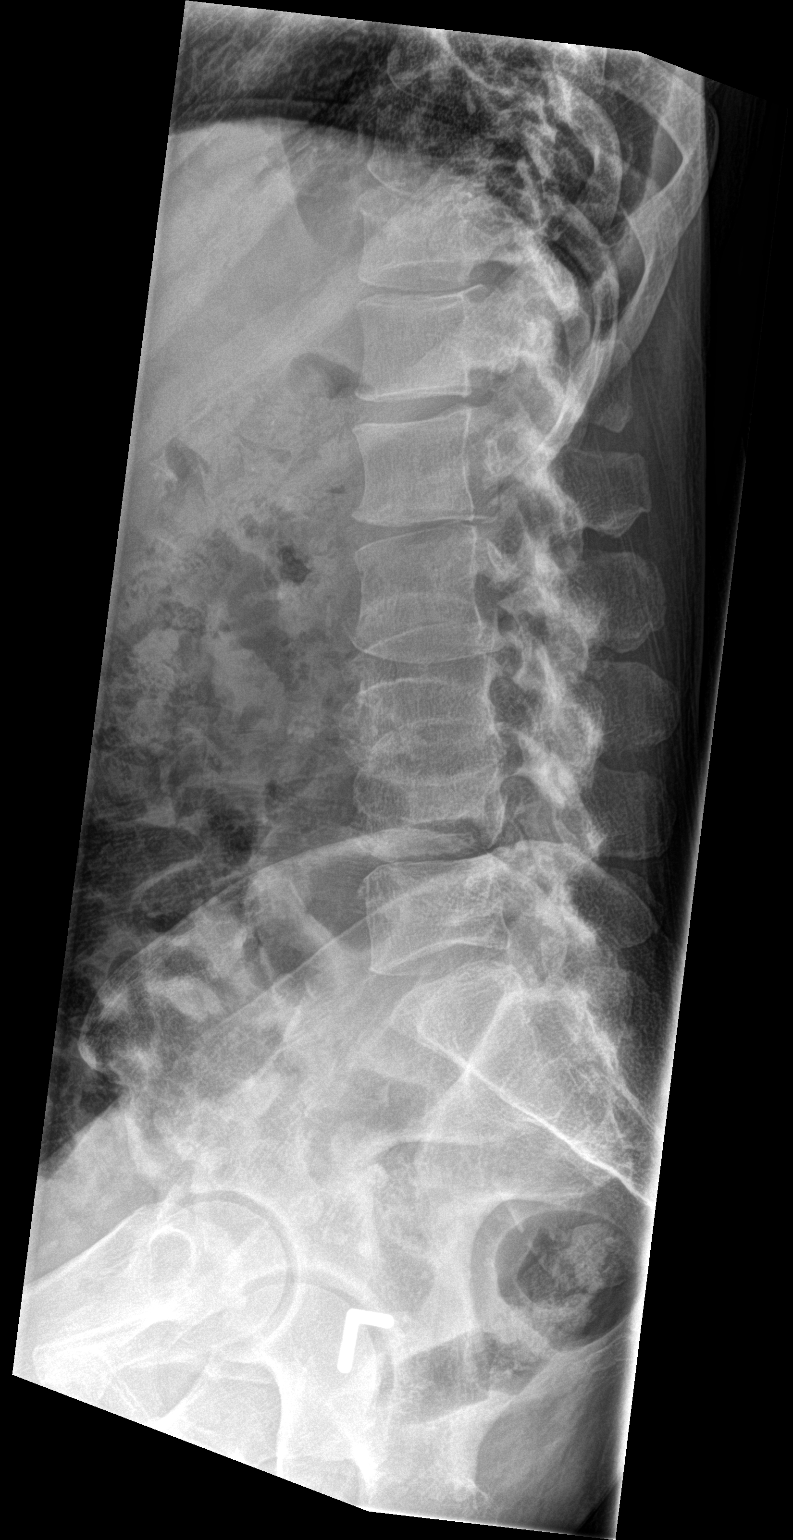

[l-spine spot]
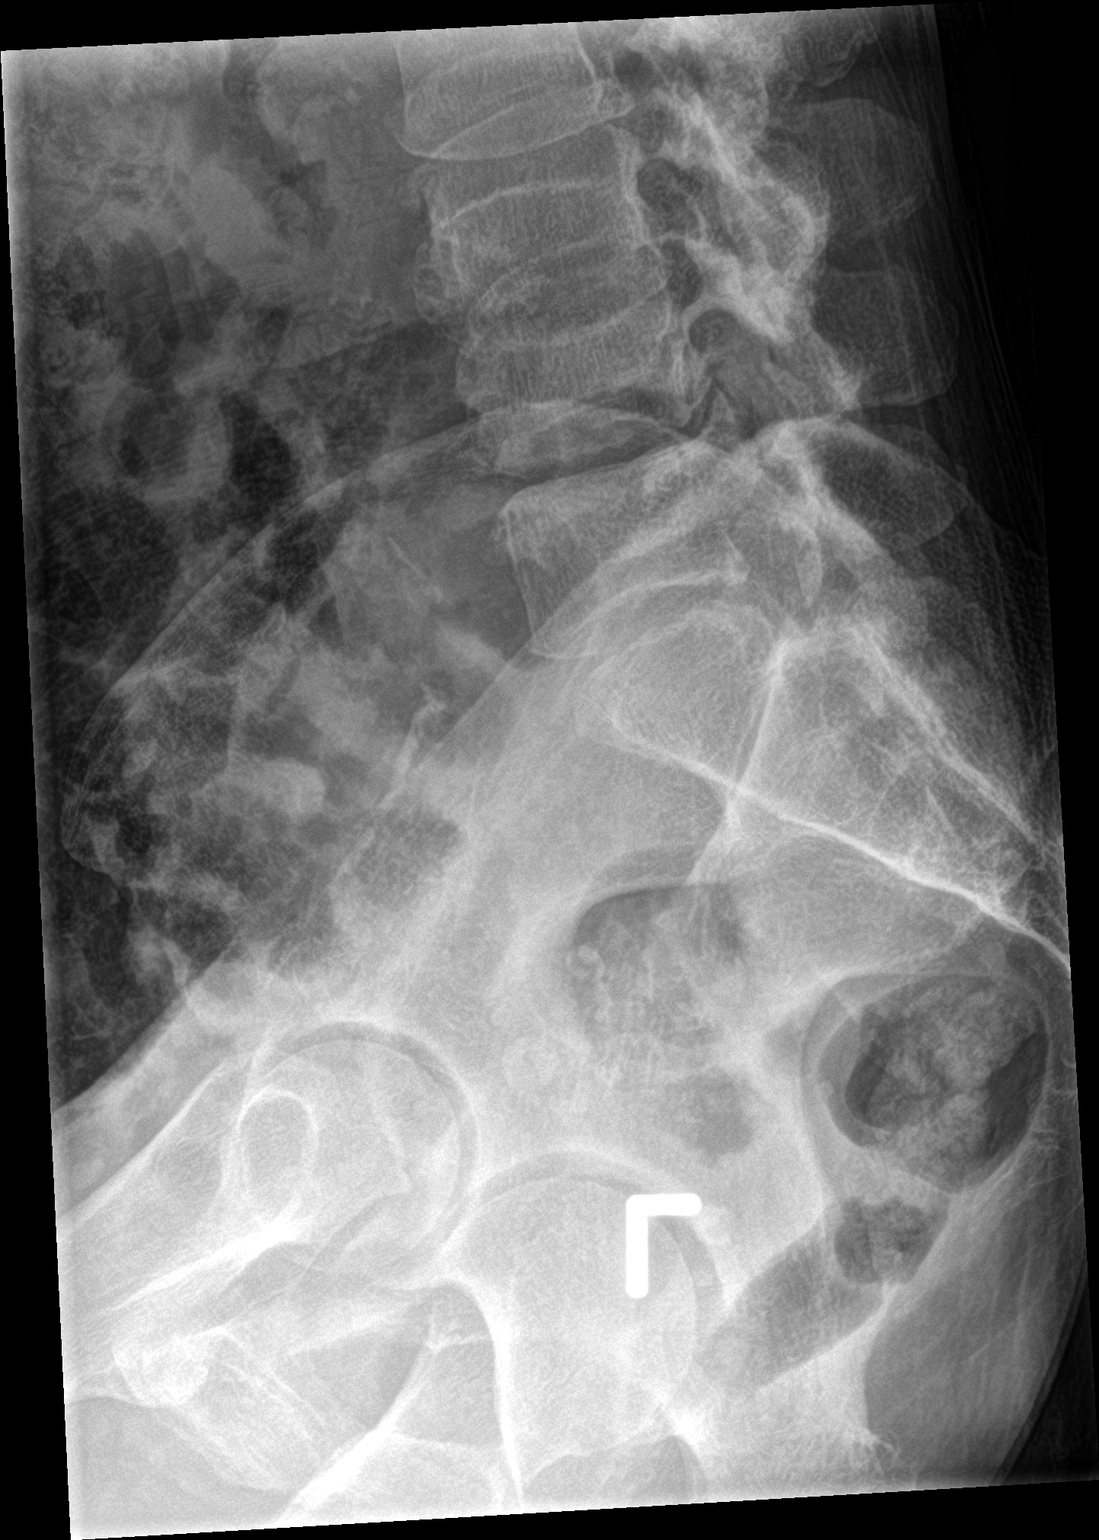

[3 of 3 positions shown; findings below may reference images not displayed]

FINDINGS: Five non-rib-bearing lumbar vertebrae. Stable marked levoconvex
thoracolumbar rotary scoliosis. Multilevel degenerative changes. No
fractures or pars defects. Bilateral pelvic phleboliths.
IMPRESSION: Stable scoliosis and multilevel degenerative changes. No acute
abnormality.

## 2020-11-19 ENCOUNTER — Other Ambulatory Visit: Payer: Self-pay

## 2020-11-19 ENCOUNTER — Emergency Department
Admission: EM | Admit: 2020-11-19 | Discharge: 2020-11-19 | Disposition: A | Discharge status: Home / Self Care | Payer: Medicare Other | Attending: Emergency Medicine | Admitting: Emergency Medicine

## 2020-11-19 DIAGNOSIS — Z5321 Procedure and treatment not carried out due to patient leaving prior to being seen by health care provider: Secondary | ICD-10-CM | POA: Insufficient documentation

## 2020-11-19 DIAGNOSIS — N644 Mastodynia: Secondary | ICD-10-CM | POA: Insufficient documentation

## 2020-11-19 DIAGNOSIS — N611 Abscess of the breast and nipple: Secondary | ICD-10-CM | POA: Diagnosis not present

## 2020-11-19 NOTE — ED Notes (Signed)
Called for room no answer. No pt found outside or in bathrooms

## 2020-11-19 NOTE — ED Triage Notes (Signed)
Pt presents via POV due to small cyst on her right nipple that developed a week ago. Pt also states she has a Hx of seizures and she hasn't had any of her medications in several days - she states she's "unsure" when her last seizure was.

## 2020-11-19 NOTE — ED Notes (Addendum)
Called for room and no answer 

## 2020-11-19 NOTE — ED Triage Notes (Signed)
Pt comes pov with small abscess/cyst area on right nipple starting yesterday

## 2020-11-19 NOTE — ED Notes (Signed)
Patient called for treatment room x 3. Pt cell phone called, no answer, unable to leave voicemail

## 2020-11-20 ENCOUNTER — Other Ambulatory Visit: Payer: Self-pay

## 2020-11-20 ENCOUNTER — Emergency Department
Admission: EM | Admit: 2020-11-20 | Discharge: 2020-11-20 | Disposition: A | Discharge status: Home / Self Care | Payer: Medicare Other | Source: Home / Self Care

## 2020-11-20 ENCOUNTER — Emergency Department
Admission: EM | Admit: 2020-11-20 | Discharge: 2020-11-20 | Disposition: A | Discharge status: Home / Self Care | Payer: Medicare Other | Attending: Emergency Medicine | Admitting: Emergency Medicine

## 2020-11-20 DIAGNOSIS — N611 Abscess of the breast and nipple: Secondary | ICD-10-CM | POA: Diagnosis not present

## 2020-11-20 DIAGNOSIS — Z5321 Procedure and treatment not carried out due to patient leaving prior to being seen by health care provider: Secondary | ICD-10-CM | POA: Insufficient documentation

## 2020-11-20 NOTE — ED Notes (Signed)
Called x2 for rooming without answer.

## 2020-11-20 NOTE — ED Triage Notes (Signed)
Pt comes via [facility] with c/o abscess to breast area. Pt seen here recently for same but left before being seen.

## 2020-11-20 NOTE — ED Notes (Signed)
No answer when called for rooming  

## 2020-12-14 ENCOUNTER — Other Ambulatory Visit: Payer: Self-pay | Admitting: Family Medicine

## 2020-12-14 DIAGNOSIS — L0291 Cutaneous abscess, unspecified: Secondary | ICD-10-CM

## 2020-12-14 DIAGNOSIS — Z1231 Encounter for screening mammogram for malignant neoplasm of breast: Secondary | ICD-10-CM

## 2021-01-31 ENCOUNTER — Other Ambulatory Visit: Payer: Self-pay | Admitting: Family Medicine

## 2021-01-31 DIAGNOSIS — Z1231 Encounter for screening mammogram for malignant neoplasm of breast: Secondary | ICD-10-CM

## 2021-02-21 ENCOUNTER — Other Ambulatory Visit: Payer: Self-pay

## 2021-02-21 ENCOUNTER — Ambulatory Visit
Admission: RE | Admit: 2021-02-21 | Discharge: 2021-02-21 | Disposition: A | Discharge status: Home / Self Care | Payer: Medicare Other | Source: Ambulatory Visit | Attending: Family Medicine | Admitting: Family Medicine

## 2021-02-21 DIAGNOSIS — Z1231 Encounter for screening mammogram for malignant neoplasm of breast: Secondary | ICD-10-CM | POA: Insufficient documentation

## 2021-02-27 ENCOUNTER — Other Ambulatory Visit: Payer: Self-pay | Admitting: Family Medicine

## 2021-02-27 DIAGNOSIS — R921 Mammographic calcification found on diagnostic imaging of breast: Secondary | ICD-10-CM

## 2021-02-27 DIAGNOSIS — R928 Other abnormal and inconclusive findings on diagnostic imaging of breast: Secondary | ICD-10-CM

## 2021-03-07 ENCOUNTER — Other Ambulatory Visit: Payer: Self-pay

## 2021-03-07 ENCOUNTER — Ambulatory Visit
Admission: RE | Admit: 2021-03-07 | Discharge: 2021-03-07 | Disposition: A | Discharge status: Home / Self Care | Payer: Medicare Other | Source: Ambulatory Visit | Attending: Family Medicine | Admitting: Family Medicine

## 2021-03-07 DIAGNOSIS — R921 Mammographic calcification found on diagnostic imaging of breast: Secondary | ICD-10-CM | POA: Insufficient documentation

## 2021-03-07 DIAGNOSIS — R928 Other abnormal and inconclusive findings on diagnostic imaging of breast: Secondary | ICD-10-CM | POA: Diagnosis not present

## 2021-03-09 ENCOUNTER — Other Ambulatory Visit: Payer: Self-pay | Admitting: Family Medicine

## 2021-03-09 DIAGNOSIS — R928 Other abnormal and inconclusive findings on diagnostic imaging of breast: Secondary | ICD-10-CM

## 2021-03-09 DIAGNOSIS — R921 Mammographic calcification found on diagnostic imaging of breast: Secondary | ICD-10-CM

## 2021-03-28 ENCOUNTER — Other Ambulatory Visit: Payer: Self-pay

## 2021-03-28 ENCOUNTER — Ambulatory Visit
Admission: RE | Admit: 2021-03-28 | Discharge: 2021-03-28 | Disposition: A | Discharge status: Home / Self Care | Payer: Medicare Other | Source: Ambulatory Visit | Attending: Family Medicine | Admitting: Family Medicine

## 2021-03-28 DIAGNOSIS — R928 Other abnormal and inconclusive findings on diagnostic imaging of breast: Secondary | ICD-10-CM

## 2021-03-28 DIAGNOSIS — R921 Mammographic calcification found on diagnostic imaging of breast: Secondary | ICD-10-CM | POA: Insufficient documentation

## 2021-03-28 HISTORY — PX: BREAST BIOPSY: SHX20

## 2021-06-02 ENCOUNTER — Encounter (HOSPITAL_BASED_OUTPATIENT_CLINIC_OR_DEPARTMENT_OTHER): Payer: Self-pay

## 2021-06-02 ENCOUNTER — Other Ambulatory Visit: Payer: Self-pay

## 2021-06-02 ENCOUNTER — Emergency Department (HOSPITAL_BASED_OUTPATIENT_CLINIC_OR_DEPARTMENT_OTHER)
Admission: EM | Admit: 2021-06-02 | Discharge: 2021-06-02 | Disposition: A | Discharge status: Home / Self Care | Payer: Medicare Other | Attending: Emergency Medicine | Admitting: Emergency Medicine

## 2021-06-02 DIAGNOSIS — R269 Unspecified abnormalities of gait and mobility: Secondary | ICD-10-CM | POA: Diagnosis not present

## 2021-06-02 DIAGNOSIS — M545 Low back pain, unspecified: Secondary | ICD-10-CM | POA: Diagnosis not present

## 2021-06-02 LAB — URINALYSIS, ROUTINE W REFLEX MICROSCOPIC
Bilirubin Urine: NEGATIVE
Glucose, UA: NEGATIVE mg/dL
Hgb urine dipstick: NEGATIVE
Ketones, ur: NEGATIVE mg/dL
Nitrite: NEGATIVE
Protein, ur: NEGATIVE mg/dL
Specific Gravity, Urine: 1.01 (ref 1.005–1.030)
pH: 5 (ref 5.0–8.0)

## 2021-06-02 MED ORDER — NAPROXEN 500 MG PO TABS: 500.0000 mg | ORAL_TABLET | Freq: Two times a day (BID) | ORAL | 0 refills | Status: DC

## 2021-06-02 MED ORDER — METHOCARBAMOL 500 MG PO TABS
500.0000 mg | ORAL_TABLET | Freq: Once | ORAL | Status: AC
  Administered 2021-06-02: 500 mg via ORAL
  Filled 2021-06-02: qty 1

## 2021-06-02 MED ORDER — METHOCARBAMOL 500 MG PO TABS: 500.0000 mg | ORAL_TABLET | Freq: Two times a day (BID) | ORAL | 0 refills | Status: DC

## 2021-06-02 MED ORDER — KETOROLAC TROMETHAMINE 30 MG/ML IJ SOLN
30.0000 mg | Freq: Once | INTRAMUSCULAR | Status: AC
Start: 2021-06-02 — End: 2021-06-02
  Administered 2021-06-02: 30 mg via INTRAMUSCULAR
  Filled 2021-06-02: qty 1

## 2021-06-02 NOTE — ED Triage Notes (Signed)
Pt presented via ems with c/o lower L back pain x1 day. Denies recent fall/ injury per ems.  ? ?Hx scoliosis and chronic back pain   ?

## 2021-06-02 NOTE — ED Provider Notes (Signed)
? ?[facility]  ?Provider Note ? ?CSN: [id number] ?Arrival date & time: 06/02/21 0039 ? ?History ?Chief Complaint  ?Patient presents with  ? Back Pain  ? ? ?[name] is a 56 y.o. 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 She has had moderate aching pain in L lower back, nonradiating. No fevers. No falls or injuries. No bowel or bladder problems.  ? ? ?Home Medications ?Prior to Admission medications   ?Medication Sig Start Date End Date Taking? Authorizing Provider  ?methocarbamol (ROBAXIN) 500 MG tablet Take 1 tablet (500 mg total) by mouth 2 (two) times daily. 06/02/21  Yes Pollyann Savoy, MD  ?naproxen (NAPROSYN) 500 MG tablet Take 1 tablet (500 mg total) by mouth 2 (two) times daily. 06/02/21  Yes Pollyann Savoy, MD  ?albuterol (VENTOLIN HFA) 108 [phone] Base) MCG/ACT inhaler  08/11/18   [provider]  ?clotrimazole-betamethasone (LOTRISONE) cream APPLY TOPICALLY TWICE DAILY FOR 14 DAYS 08/20/19   Malfi, Jodelle Gross, FNP  ?divalproex (DEPAKOTE) 500 MG DR tablet Take 1 tab in the morning and 2 tabs at night 04/29/18   Sharman Cheek, MD  ?gabapentin (NEURONTIN) 100 MG capsule Take 1 capsule (100 mg total) by mouth 3 (three) times daily. ?Patient not taking: Reported on 08/31/2018 08/28/18 08/28/19  Nita Sickle, MD  ?phenytoin (DILANTIN) 100 MG ER capsule Take 2 capsules (200 mg total) by mouth at bedtime. 04/29/18 06/30/19  Sharman Cheek, MD  ? ? ? ?Allergies    ?Flagyl [metronidazole] ? ? ?Review of Systems   ?Review of Systems ?Please see HPI for pertinent positives and negatives ? ?Physical Exam ?BP 107/73   Pulse 95   Temp 98.8 ?F (37.1 ?C)   Resp 18   Ht 5\' 4"  (1.626 m)   Wt 44 kg   SpO2 100%   BMI 16.65 kg/m?  ? ?Physical Exam ?Vitals and nursing note reviewed.  ?Constitutional:   ?    Appearance: Normal appearance.  ?HENT:  ?   Head: Normocephalic and atraumatic.  ?   Nose: Nose normal.  ?   Mouth/Throat:  ?   Mouth: Mucous membranes are moist.  ?Eyes:  ?   Extraocular Movements: Extraocular movements intact.  ?   Conjunctiva/sclera: Conjunctivae normal.  ?Cardiovascular:  ?   Rate and Rhythm: Normal rate.  ?Pulmonary:  ?   Effort: Pulmonary effort is normal.  ?   Breath sounds: Normal breath sounds.  ?Abdominal:  ?   General: Abdomen is flat.  ?   Palpations: Abdomen is soft.  ?   Tenderness: There is no abdominal tenderness.  ?Musculoskeletal:     ?   General: Tenderness (L lumbar paraspinal muscles) present. No swelling. Normal range of motion.  ?   Cervical back: Neck supple.  ?Skin: ?   General: Skin is warm and dry.  ?Neurological:  ?   General: No focal deficit present.  ?   Mental Status: She is alert.  ?   Sensory: No sensory deficit.  ?   Motor: No weakness.  ?   Gait: Gait abnormal (antalgic gait).  ?Psychiatric:     ?   Mood and Affect: Mood normal.  ? ? ?ED Results / Procedures / Treatments   ?EKG ?None ? ?Procedures ?Procedures ? ?Medications Ordered in the ED ?Medications  ?  ketorolac (TORADOL) 30 MG/ML injection 30 mg (30 mg Intramuscular Given 06/02/21 0259)  ?methocarbamol (ROBAXIN) tablet 500 mg (500 mg Oral Given 06/02/21 0259)  ? ? ?Initial Impression and Plan ? Patient here with MSK back pain. No red flags. Will give Toradol and robaxin. Rx for NSAID, robaxin and PCP follow up.  ? ?ED Course  ? ?  ? ? ?MDM Rules/Calculators/A&P ?Medical Decision Making ?Problems Addressed: ?Acute left-sided low back pain without sciatica: acute illness or injury ? ?Amount and/or Complexity of Data Reviewed ?Labs: ordered. Decision-making details documented in ED Course. ? ?Risk ?Prescription drug management. ? ? ? ?Final Clinical Impression(s) / ED Diagnoses ?Final diagnoses:  ?Acute left-sided low back pain without sciatica  ? ? ?Rx / DC Orders ?ED Discharge Orders   ? ?      Ordered  ?   naproxen (NAPROSYN) 500 MG tablet  2 times daily       ? 06/02/21 0310  ?  methocarbamol (ROBAXIN) 500 MG tablet  2 times daily       ? 06/02/21 0310  ? ?  ?  ? ?  ? ?  ?Pollyann Savoy, MD ?06/02/21 0310 ? ?

## 2021-06-26 DIAGNOSIS — H5015 Alternating exotropia: Secondary | ICD-10-CM | POA: Insufficient documentation

## 2021-06-26 DIAGNOSIS — H5022 Vertical strabismus, left eye: Secondary | ICD-10-CM | POA: Insufficient documentation

## 2021-06-26 DIAGNOSIS — H532 Diplopia: Secondary | ICD-10-CM | POA: Insufficient documentation

## 2021-07-20 ENCOUNTER — Other Ambulatory Visit: Payer: Self-pay | Admitting: *Deleted

## 2021-07-20 DIAGNOSIS — Z122 Encounter for screening for malignant neoplasm of respiratory organs: Secondary | ICD-10-CM

## 2021-07-20 DIAGNOSIS — F17210 Nicotine dependence, cigarettes, uncomplicated: Secondary | ICD-10-CM

## 2021-07-20 DIAGNOSIS — Z87891 Personal history of nicotine dependence: Secondary | ICD-10-CM

## 2021-07-22 ENCOUNTER — Other Ambulatory Visit: Payer: Self-pay

## 2021-07-22 ENCOUNTER — Emergency Department
Admission: EM | Admit: 2021-07-22 | Discharge: 2021-07-22 | Disposition: A | Discharge status: Home / Self Care | Payer: Medicare Other | Attending: Emergency Medicine | Admitting: Emergency Medicine

## 2021-07-22 ENCOUNTER — Emergency Department: Payer: Medicare Other

## 2021-07-22 DIAGNOSIS — W06XXXA Fall from bed, initial encounter: Secondary | ICD-10-CM | POA: Diagnosis not present

## 2021-07-22 DIAGNOSIS — W19XXXA Unspecified fall, initial encounter: Secondary | ICD-10-CM

## 2021-07-22 DIAGNOSIS — S8991XA Unspecified injury of right lower leg, initial encounter: Secondary | ICD-10-CM | POA: Diagnosis present

## 2021-07-22 DIAGNOSIS — S8001XA Contusion of right knee, initial encounter: Secondary | ICD-10-CM | POA: Diagnosis not present

## 2021-07-22 MED ORDER — MELOXICAM 15 MG PO TABS: 15.0000 mg | ORAL_TABLET | Freq: Every day | ORAL | 0 refills | Status: DC

## 2021-07-22 MED ORDER — HYDROCODONE-ACETAMINOPHEN 5-325 MG PO TABS
1.0000 | ORAL_TABLET | Freq: Once | ORAL | Status: AC
  Administered 2021-07-22: 1 via ORAL
  Filled 2021-07-22: qty 1

## 2021-07-22 NOTE — ED Provider Notes (Signed)
[facility] Provider Note  Patient Contact: 8:00 PM (approximate)   History   Fall   HPI  [name] is a 56 y.o. female who presents the emergency department complaining of leg and knee pain after rolling out of her bed.  Patient states that she was too close to the edge and rolled out and landed on her right leg.  She hurts from her hip to her ankle but mostly at her knee.  Did not hit her head or lose consciousness.  Denies any other complaints.     Physical Exam   Triage Vital Signs: ED Triage Vitals  Enc Vitals Group     BP 07/22/21 1852 125/89     Pulse Rate 07/22/21 1852 89     Resp 07/22/21 1852 20     Temp 07/22/21 1852 99.4 F (37.4 C)     Temp Source 07/22/21 1852 Oral     SpO2 07/22/21 1852 96 %     Weight 07/22/21 1853 110 lb (49.9 kg)     Height 07/22/21 1853 5\' 4"  (1.626 m)     Head Circumference --      Peak Flow --      Pain Score 07/22/21 1853 8     Pain Loc --      Pain Edu? --      Excl. in GC? --     Most recent vital signs: Vitals:   07/22/21 1852  BP: 125/89  Pulse: 89  Resp: 20  Temp: 99.4 F (37.4 C)  SpO2: 96%     General: Alert and in no acute distress.  Cardiovascular:  Good peripheral perfusion Respiratory: Normal respiratory effort without tachypnea or retractions. Lungs CTAB.  Musculoskeletal: Full range of motion to all extremities.  Visualization of the right lower extremity reveals no obvious signs of trauma.  Patient has good range of motion to the hip, knee and ankle joint.  Patient is tender globally about the knee without palpable abnormality or ballottement.  No other significant tenderness to palpation.  Pulses sensation intact distally. Neurologic:  No gross focal neurologic deficits are appreciated.  Skin:   No rash noted Other:   ED Results / Procedures / Treatments   Labs (all labs ordered are listed, but only abnormal results are displayed) Labs Reviewed - No data to  display   EKG     RADIOLOGY  I personally viewed, evaluated, and interpreted these images as part of my medical decision making, as well as reviewing the written report by the radiologist.  ED Provider Interpretation: No acute traumatic finding on x-rays of the femur or tib-fib.  DG Femur Min 2 Views Right  Result Date: 07/22/2021 CLINICAL DATA:  Fall and trauma to the right lower extremity. EXAM: RIGHT FEMUR 2 VIEWS; RIGHT TIBIA AND FIBULA - 2 VIEW COMPARISON:  None Available. FINDINGS: There is no acute fracture or dislocation. Mild osteopenia. No significant arthritic changes. Soft tissue ossification noted adjacent to the right ischial tuberosity. Atherosclerotic calcification of the vasculature. The soft tissues are unremarkable. IMPRESSION: Negative. Electronically Signed   By: 07/24/2021 M.D.   On: 07/22/2021 20:37   DG Tibia/Fibula Right  Result Date: 07/22/2021 CLINICAL DATA:  Fall and trauma to the right lower extremity. EXAM: RIGHT FEMUR 2 VIEWS; RIGHT TIBIA AND FIBULA - 2 VIEW COMPARISON:  None Available. FINDINGS: There is no acute fracture or dislocation. Mild osteopenia. No significant arthritic changes. Soft tissue ossification noted adjacent to the right  ischial tuberosity. Atherosclerotic calcification of the vasculature. The soft tissues are unremarkable. IMPRESSION: Negative. Electronically Signed   By: Elgie Collard M.D.   On: 07/22/2021 20:37    PROCEDURES:  Critical Care performed: No  Procedures   MEDICATIONS ORDERED IN ED: Medications  HYDROcodone-acetaminophen (NORCO/VICODIN) 5-325 MG per tablet 1 tablet (1 tablet Oral Given 07/22/21 2008)     IMPRESSION / MDM / ASSESSMENT AND PLAN / ED COURSE  I reviewed the triage vital signs and the nursing notes.                              Differential diagnosis includes, but is not limited to, knee contusion, femur fracture, tibial plateau fracture, ligamentous injury  Patient's presentation is most  consistent with acute illness / injury with system symptoms.   Patient's diagnosis is consistent with knee contusion after falling out of bed.  Patient presents to the emergency department complaining of right knee pain.  Overall exam is reassuring.  No open wounds.  X-ray reveals no acute traumatic findings.  Anti-inflammatories for symptom relief.  Follow-up primary care as needed. Patient is given ED precautions to return to the ED for any worsening or new symptoms.        FINAL CLINICAL IMPRESSION(S) / ED DIAGNOSES   Final diagnoses:  Fall, initial encounter  Contusion of right knee, initial encounter     Rx / DC Orders   ED Discharge Orders          Ordered    meloxicam (MOBIC) 15 MG tablet  Daily        07/22/21 2157             Note:  This document was prepared using Dragon voice recognition software and may include unintentional dictation errors.   Lanette Hampshire 07/22/21 2158    Georga Hacking, MD 07/25/21 1759

## 2021-07-22 NOTE — ED Notes (Signed)
Pt stating she fell from bed today and hurt her R leg. PT showing pain in lateral R hip radiating down lateral upper leg into knee.

## 2021-07-22 NOTE — ED Triage Notes (Signed)
Pt states she fell out of the bed this AM and hurt her R leg

## 2021-07-22 NOTE — ED Notes (Signed)
PT stating they are in extreme pain at this time

## 2021-07-22 NOTE — ED Notes (Signed)
Pt discharged prior to RN assessment. Pt verbalized understanding of discharge instructions, prescriptions, and follow-up care instructions. Pt advised if symptoms worsen to return to ED. 

## 2021-07-31 ENCOUNTER — Ambulatory Visit: Admission: RE | Admit: 2021-07-31 | Payer: Medicare Other | Source: Ambulatory Visit

## 2021-08-17 DIAGNOSIS — Z9889 Other specified postprocedural states: Secondary | ICD-10-CM | POA: Insufficient documentation

## 2021-08-19 DIAGNOSIS — M899 Disorder of bone, unspecified: Secondary | ICD-10-CM | POA: Insufficient documentation

## 2021-08-19 DIAGNOSIS — Z789 Other specified health status: Secondary | ICD-10-CM | POA: Insufficient documentation

## 2021-08-19 DIAGNOSIS — Z79899 Other long term (current) drug therapy: Secondary | ICD-10-CM | POA: Insufficient documentation

## 2021-08-19 DIAGNOSIS — G894 Chronic pain syndrome: Secondary | ICD-10-CM | POA: Insufficient documentation

## 2021-08-19 NOTE — Progress Notes (Unsigned)
Patient: [name]  Service Category: E/M  Provider: Gaspar Cola, MD  DOB: 17-Nov-1965  DOS: 08/20/2021  Referring Provider: Margaretha Sheffield, MD  MRN: [id number]  Setting: Ambulatory outpatient  PCP: Shelly Lank, MD  Type: New Patient  Specialty: Interventional Pain Management    Location: Office  Delivery: Face-to-face     Primary Reason(s) for Visit: Encounter for initial evaluation of one or more chronic problems (new to examiner) potentially causing chronic pain, and posing a threat to normal musculoskeletal function. (Level of risk: High) CC: No chief complaint on file.  HPI  Ms. Palm is a 56 y.o. year old, female patient, who comes for the first time to our practice referred by Margaretha Sheffield, MD for our initial evaluation of her chronic pain. 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[place]); Eczema; Right wrist pain; Cyst of skin of breast; Mass involving both sides of chest wall; Low back pain; Spasm of muscle of lower back; Somatic dysfunction of spine, lumbar; Somatic dysfunction of spine, thoracic; Acute renal failure (ARF) ([place]); Bipolar disorder ([place]); Lumbar radicular pain (left); History of cocaine abuse ([place]); Polysubstance abuse ([place]); Alternating exotropia; Diplopia; Hypertropia of left eye; S/P eye surgery; Mood disorder ([place]); Chronic pain syndrome; Pharmacologic therapy; Disorder of skeletal system; and Problems influencing health status on their problem list. Today she comes in for evaluation of her No chief complaint on file.  Pain Assessment: Location:     Radiating:   Onset:   Duration:   Quality:   Severity:  /10 (subjective, self-reported pain score)  Effect on ADL:   Timing:   Modifying factors:   BP:    HR:    Onset and Duration: {Hx; Onset and Duration:210120511} Cause of pain: {Hx; Cause:210120521} Severity: {Pain Severity:210120502} Timing: {Symptoms; Timing:210120501} Aggravating Factors: {Causes; Aggravating pain  factors:210120507} Alleviating Factors: {Causes; Alleviating Factors:210120500} Associated Problems: {Hx; Associated problems:210120515} Quality of Pain: {Hx; Symptom quality or Descriptor:210120531} Previous Examinations or Tests: {Hx; Previous examinations or test:210120529} Previous Treatments: {Hx; Previous Treatment:210120503}  ***  Today I took the time to provide the patient with information regarding my pain practice. The patient was informed that my practice is divided into two sections: an interventional pain management section, as well as a completely separate and distinct medication management section. I explained that I have procedure days for my interventional therapies, and evaluation days for follow-ups and medication management. Because of the amount of documentation required during both, they are kept separated. This means that there is the possibility that she may be scheduled for a procedure on one day, and medication management the next. I have also informed her that because of staffing and facility limitations, I no longer take patients for medication management only. To illustrate the reasons for this, I gave the patient the example of surgeons, and how inappropriate it would be to refer a patient to his/her care, just to write for the post-surgical antibiotics on a surgery done by a different surgeon.   Because interventional pain management is my board-certified specialty, the patient was informed that joining my practice means that they are open to any and all interventional therapies. I made it clear that this does not mean that they will be forced to have any procedures done. What this means is that I believe interventional therapies to be essential part of the diagnosis and proper management of chronic pain conditions. Therefore, patients not interested in these interventional alternatives will be better served under the care of a  different practitioner.  The patient was  also made aware of my Comprehensive Pain Management Safety Guidelines where by joining my practice, they limit all of their nerve blocks and joint injections to those done by our practice, for as long as we are retained to manage their care.   Historic Controlled Substance Pharmacotherapy Review  PMP and historical list of controlled substances: ***  Current opioid analgesics:   *** MME/day: *** mg/day  Historical Monitoring: The patient  reports current drug use. Drug: Cocaine. List of all UDS Test(s): Lab Results  Component Value Date   MDMA NONE DETECTED 09/03/2018   MDMA NONE DETECTED 01/07/2018   MDMA NONE DETECTED 08/14/2017   MDMA NONE DETECTED 05/01/2017   MDMA NONE DETECTED 05/29/2016   MDMA NONE DETECTED 05/28/2016   MDMA NONE DETECTED 03/27/2016   MDMA NONE DETECTED 03/21/2016   MDMA NONE DETECTED 08/22/2015   MDMA NONE DETECTED 06/13/2015   MDMA NONE DETECTED 04/02/2015   MDMA NONE DETECTED 11/01/2014   MDMA NONE DETECTED 09/12/2014   MDMA NONE DETECTED 08/21/2014   MDMA NEGATIVE 05/05/2014   MDMA NEGATIVE 06/23/2013   MDMA NEGATIVE 02/01/2013   MDMA NEGATIVE 07/18/2012   MDMA NEGATIVE 06/29/2012   MDMA NEGATIVE 04/24/2012   MDMA NEGATIVE 04/02/2012   MDMA NEGATIVE 11/08/2011   MDMA NEGATIVE 09/26/2011   COCAINSCRNUR NONE DETECTED 09/03/2018   COCAINSCRNUR NONE DETECTED 01/07/2018   COCAINSCRNUR POSITIVE (A) 08/14/2017   COCAINSCRNUR NONE DETECTED 05/01/2017   COCAINSCRNUR NONE DETECTED 05/29/2016   COCAINSCRNUR NONE DETECTED 05/28/2016   COCAINSCRNUR NONE DETECTED 03/27/2016   COCAINSCRNUR POSITIVE (A) 03/21/2016   COCAINSCRNUR POSITIVE (A) 08/22/2015   COCAINSCRNUR POSITIVE (A) 06/13/2015   COCAINSCRNUR POSITIVE (A) 04/02/2015   COCAINSCRNUR NONE DETECTED 11/01/2014   COCAINSCRNUR NONE DETECTED 09/12/2014   COCAINSCRNUR POSITIVE (A) 08/21/2014   COCAINSCRNUR POSITIVE 05/05/2014   COCAINSCRNUR POSITIVE 06/23/2013   COCAINSCRNUR POSITIVE 02/01/2013    COCAINSCRNUR NEGATIVE 07/18/2012   COCAINSCRNUR POSITIVE 06/29/2012   COCAINSCRNUR POSITIVE 04/24/2012   COCAINSCRNUR POSITIVE 04/02/2012   COCAINSCRNUR POSITIVE 11/08/2011   COCAINSCRNUR POSITIVE 09/26/2011   PCPSCRNUR NONE DETECTED 09/03/2018   PCPSCRNUR NONE DETECTED 01/07/2018   PCPSCRNUR NONE DETECTED 08/14/2017   PCPSCRNUR NONE DETECTED 05/01/2017   PCPSCRNUR NONE DETECTED 05/29/2016   PCPSCRNUR NONE DETECTED 05/28/2016   PCPSCRNUR NONE DETECTED 03/27/2016   PCPSCRNUR NONE DETECTED 03/21/2016   PCPSCRNUR NONE DETECTED 08/22/2015   PCPSCRNUR NONE DETECTED 06/13/2015   PCPSCRNUR NONE DETECTED 04/02/2015   PCPSCRNUR NONE DETECTED 11/01/2014   PCPSCRNUR NONE DETECTED 09/12/2014   PCPSCRNUR NONE DETECTED 08/21/2014   PCPSCRNUR NEGATIVE 05/05/2014   PCPSCRNUR NEGATIVE 06/23/2013   PCPSCRNUR NEGATIVE 02/01/2013   PCPSCRNUR NEGATIVE 07/18/2012   PCPSCRNUR NEGATIVE 06/29/2012   PCPSCRNUR NEGATIVE 04/24/2012   PCPSCRNUR NEGATIVE 04/02/2012   PCPSCRNUR NEGATIVE 11/08/2011   PCPSCRNUR NEGATIVE 09/26/2011   THCU NONE DETECTED 09/03/2018   THCU NONE DETECTED 01/07/2018   THCU NONE DETECTED 08/14/2017   THCU NONE DETECTED 05/01/2017   THCU NONE DETECTED 05/29/2016   THCU NONE DETECTED 05/28/2016   THCU NONE DETECTED 03/27/2016   THCU NONE DETECTED 03/21/2016   THCU NONE DETECTED 08/22/2015   THCU NONE DETECTED 06/13/2015   THCU NONE DETECTED 04/02/2015   THCU NONE DETECTED 11/01/2014   THCU NONE DETECTED 09/12/2014   THCU NONE DETECTED 08/21/2014   THCU NEGATIVE 05/05/2014   THCU NEGATIVE 06/23/2013   THCU NEGATIVE 02/01/2013   THCU NEGATIVE 07/18/2012   THCU NEGATIVE 06/29/2012   THCU NEGATIVE 04/24/2012  THCU NEGATIVE 04/02/2012   THCU NEGATIVE 11/08/2011   THCU NEGATIVE 09/26/2011   ETH <10 01/07/2018   ETH 108 (H) 05/01/2017   ETH 219 (H) 08/22/2015   ETH 70 (H) 06/13/2015   ETH 209 (H) 11/01/2014   ETH 227 (H) 09/12/2014   ETH 268 (H) 08/21/2014   List of  other Serum/Urine Drug Screening Test(s):  Lab Results  Component Value Date   COCAINSCRNUR NONE DETECTED 09/03/2018   COCAINSCRNUR NONE DETECTED 01/07/2018   COCAINSCRNUR POSITIVE (A) 08/14/2017   COCAINSCRNUR NONE DETECTED 05/01/2017   COCAINSCRNUR NONE DETECTED 05/29/2016   COCAINSCRNUR NONE DETECTED 05/28/2016   COCAINSCRNUR NONE DETECTED 03/27/2016   COCAINSCRNUR POSITIVE (A) 03/21/2016   COCAINSCRNUR POSITIVE (A) 08/22/2015   COCAINSCRNUR POSITIVE (A) 06/13/2015   COCAINSCRNUR POSITIVE (A) 04/02/2015   COCAINSCRNUR NONE DETECTED 11/01/2014   COCAINSCRNUR NONE DETECTED 09/12/2014   COCAINSCRNUR POSITIVE (A) 08/21/2014   COCAINSCRNUR POSITIVE 05/05/2014   COCAINSCRNUR POSITIVE 06/23/2013   COCAINSCRNUR POSITIVE 02/01/2013   COCAINSCRNUR NEGATIVE 07/18/2012   COCAINSCRNUR POSITIVE 06/29/2012   COCAINSCRNUR POSITIVE 04/24/2012   COCAINSCRNUR POSITIVE 04/02/2012   COCAINSCRNUR POSITIVE 11/08/2011   COCAINSCRNUR POSITIVE 09/26/2011   THCU NONE DETECTED 09/03/2018   THCU NONE DETECTED 01/07/2018   THCU NONE DETECTED 08/14/2017   THCU NONE DETECTED 05/01/2017   THCU NONE DETECTED 05/29/2016   THCU NONE DETECTED 05/28/2016   THCU NONE DETECTED 03/27/2016   THCU NONE DETECTED 03/21/2016   THCU NONE DETECTED 08/22/2015   THCU NONE DETECTED 06/13/2015   THCU NONE DETECTED 04/02/2015   THCU NONE DETECTED 11/01/2014   THCU NONE DETECTED 09/12/2014   THCU NONE DETECTED 08/21/2014   THCU NEGATIVE 05/05/2014   THCU NEGATIVE 06/23/2013   THCU NEGATIVE 02/01/2013   THCU NEGATIVE 07/18/2012   THCU NEGATIVE 06/29/2012   THCU NEGATIVE 04/24/2012   THCU NEGATIVE 04/02/2012   THCU NEGATIVE 11/08/2011   THCU NEGATIVE 09/26/2011   ETH <10 01/07/2018   ETH 108 (H) 05/01/2017   ETH 219 (H) 08/22/2015   ETH 70 (H) 06/13/2015   ETH 209 (H) 11/01/2014   ETH 227 (H) 09/12/2014   ETH 268 (H) 08/21/2014   Historical Background Evaluation: [place] PMP: PDMP reviewed during this encounter.  Review of the past 73-months conducted.             PMP NARX Score Report:  Narcotic: *** Sedative: *** Stimulant: *** [place] Department of public safety, offender search: Editor, commissioning Information) Non-contributory Risk Assessment Profile: Aberrant behavior: None observed or detected today Risk factors for fatal opioid overdose: None identified today PMP NARX Overdose Risk Score: *** Fatal overdose hazard ratio (HR): Calculation deferred Non-fatal overdose hazard ratio (HR): Calculation deferred Risk of opioid abuse or dependence: 0.7-3.0% with doses ? 36 MME/day and 6.1-26% with doses ? 120 MME/day. Substance use disorder (SUD) risk level: See below Personal History of Substance Abuse (SUD-Substance use disorder):  Alcohol:    Illegal Drugs:    Rx Drugs:    ORT Risk Level calculation:    ORT Scoring interpretation table:  Score <3 = Low Risk for SUD  Score between 4-7 = Moderate Risk for SUD  Score >8 = High Risk for Opioid Abuse   PHQ-2 Depression Scale:  Total score:    PHQ-2 Scoring interpretation table: (Score and probability of major depressive disorder)  Score 0 = No depression  Score 1 = 15.4% Probability  Score 2 = 21.1% Probability  Score 3 = 38.4% Probability  Score 4 = 45.5% Probability  Score 5 = 56.4% Probability  Score 6 = 78.6% Probability   PHQ-9 Depression Scale:  Total score:    PHQ-9 Scoring interpretation table:  Score 0-4 = No depression  Score 5-9 = Mild depression  Score 10-14 = Moderate depression  Score 15-19 = Moderately severe depression  Score 20-27 = Severe depression (2.4 times higher risk of SUD and 2.89 times higher risk of overuse)   Pharmacologic Plan: As per protocol, I have not taken over any controlled substance management, pending the results of ordered tests and/or consults.            Initial impression: Pending review of available data and ordered tests.  Meds   Current Outpatient Medications:    albuterol (VENTOLIN HFA) 108 (90  Base) MCG/ACT inhaler, , Disp: , Rfl:    clotrimazole-betamethasone (LOTRISONE) cream, APPLY TOPICALLY TWICE DAILY FOR 14 DAYS, Disp: 45 g, Rfl: 0   divalproex (DEPAKOTE) 500 MG DR tablet, Take 1 tab in the morning and 2 tabs at night, Disp: 90 tablet, Rfl: 0   gabapentin (NEURONTIN) 100 MG capsule, Take 1 capsule (100 mg total) by mouth 3 (three) times daily. (Patient not taking: Reported on 08/31/2018), Disp: 30 capsule, Rfl: 0   meloxicam (MOBIC) 15 MG tablet, Take 1 tablet (15 mg total) by mouth daily., Disp: 30 tablet, Rfl: 0   methocarbamol (ROBAXIN) 500 MG tablet, Take 1 tablet (500 mg total) by mouth 2 (two) times daily., Disp: 20 tablet, Rfl: 0   naproxen (NAPROSYN) 500 MG tablet, Take 1 tablet (500 mg total) by mouth 2 (two) times daily., Disp: 30 tablet, Rfl: 0   phenytoin (DILANTIN) 100 MG ER capsule, Take 2 capsules (200 mg total) by mouth at bedtime., Disp: 60 capsule, Rfl: 0  Imaging Review  Cervical Imaging: Cervical MR wo contrast: No results found for this or any previous visit.  Cervical MR wo contrast: No valid procedures specified. Cervical MR w/wo contrast: No results found for this or any previous visit.  Cervical MR w contrast: No results found for this or any previous visit.  Cervical CT wo contrast: No results found for this or any previous visit.  Cervical CT w/wo contrast: No results found for this or any previous visit.  Cervical CT w/wo contrast: No results found for this or any previous visit.  Cervical CT w contrast: No results found for this or any previous visit.  Cervical CT outside: No results found for this or any previous visit.  Cervical DG 1 view: No results found for this or any previous visit.  Cervical DG 2-3 views: No results found for this or any previous visit.  Cervical DG F/E views: No results found for this or any previous visit.  Cervical DG 2-3 clearing views: No results found for this or any previous visit.  Cervical DG  Bending/F/E views: No results found for this or any previous visit.  Cervical DG complete: No results found for this or any previous visit.  Cervical DG Myelogram views: No results found for this or any previous visit.  Cervical DG Myelogram views: No results found for this or any previous visit.  Cervical Discogram views: No results found for this or any previous visit.   Shoulder Imaging: Shoulder-R MR w contrast: No results found for this or any previous visit.  Shoulder-L MR w contrast: No results found for this or any previous visit.  Shoulder-R MR w/wo contrast: No results found for this or any previous visit.  Shoulder-L MR  w/wo contrast: No results found for this or any previous visit.  Shoulder-R MR wo contrast: No results found for this or any previous visit.  Shoulder-L MR wo contrast: No results found for this or any previous visit.  Shoulder-R CT w contrast: No results found for this or any previous visit.  Shoulder-L CT w contrast: No results found for this or any previous visit.  Shoulder-R CT w/wo contrast: No results found for this or any previous visit.  Shoulder-L CT w/wo contrast: No results found for this or any previous visit.  Shoulder-R CT wo contrast: No results found for this or any previous visit.  Shoulder-L CT wo contrast: No results found for this or any previous visit.  Shoulder-R DG Arthrogram: No results found for this or any previous visit.  Shoulder-L DG Arthrogram: No results found for this or any previous visit.  Shoulder-R DG 1 view: No results found for this or any previous visit.  Shoulder-L DG 1 view: No results found for this or any previous visit.  Shoulder-R DG: Results for orders placed during the hospital encounter of 01/26/20  DG Shoulder Right  Narrative CLINICAL DATA:  Recent seizure and right shoulder pain.  EXAM: RIGHT SHOULDER - 2+ VIEW  COMPARISON:  None.  FINDINGS: There is no evidence of fracture or  dislocation. Mild degenerative changes seen involving the right acromioclavicular joint and right glenohumeral articulation. Soft tissues are unremarkable.  IMPRESSION: Mild degenerative changes without evidence of acute fracture or dislocation.   Electronically Signed By: Virgina Norfolk M.D. On: 01/26/2020 20:09  Shoulder-L DG: No results found for this or any previous visit.   Thoracic Imaging: Thoracic MR wo contrast: No results found for this or any previous visit.  Thoracic MR wo contrast: No valid procedures specified. Thoracic MR w/wo contrast: No results found for this or any previous visit.  Thoracic MR w contrast: No results found for this or any previous visit.  Thoracic CT wo contrast: No results found for this or any previous visit.  Thoracic CT w/wo contrast: No results found for this or any previous visit.  Thoracic CT w/wo contrast: No results found for this or any previous visit.  Thoracic CT w contrast: No results found for this or any previous visit.  Thoracic DG 2-3 views: No results found for this or any previous visit.  Thoracic DG 4 views: No results found for this or any previous visit.  Thoracic DG: No results found for this or any previous visit.  Thoracic DG w/swimmers view: No results found for this or any previous visit.  Thoracic DG Myelogram views: No results found for this or any previous visit.  Thoracic DG Myelogram views: No results found for this or any previous visit.   Lumbosacral Imaging: Lumbar MR wo contrast: Results for orders placed during the hospital encounter of 08/06/18  MR LUMBAR SPINE WO CONTRAST  Narrative CLINICAL DATA:  Chronic low back pain and left leg pain. Difficulty walking.  EXAM: MRI LUMBAR SPINE WITHOUT CONTRAST  TECHNIQUE: Multiplanar, multisequence MR imaging of the lumbar spine was performed. No intravenous contrast was administered.  COMPARISON:  Lumbar radiography  11/27/2016.  FINDINGS: Segmentation:  5 lumbar type vertebral bodies assumed.  Alignment: Pronounced scoliotic curvature convex to the left with the apex at L1-2.  Vertebrae: No primary bone abnormality. Discogenic edematous changes within the L3, L4 and L5 vertebral bodies. Edema of the right sacrum and iliac bones, presumed secondary to sacroiliac arthropathy.  Conus medullaris and cauda  equina: Conus extends to the L1 level. Conus and cauda equina appear normal.  Paraspinal and other soft tissues: Negative  Disc levels:  T12-L1: Disc and facet degeneration more pronounced on the right. Mild right foraminal narrowing. No likely compressive stenosis.  L1-2: Mild disc bulge towards the right. Right-sided facet degeneration. Mild right foraminal narrowing, not likely compressive.  L2-3: Disc degeneration more pronounced on the right. Endplate osteophytes and bulging of the disc. Facet degeneration and hypertrophy worse on the right. Narrowing of the right lateral recess and intervertebral foramen on the right that could possibly cause right-sided symptoms.  L3-4: Disc degeneration with endplate osteophytes and bulging of the disc more pronounced towards the left. Bilateral facet and ligamentous hypertrophy. Multifactorial spinal stenosis worse on the left than the right. Potential for neural compression at this level, particularly on the left. Discogenic edema of the L3 and L4 endplates could contribute to back pain.  L4-5: Disc degeneration worse on the left. Endplate osteophytes and bulging of the disc. Facet degeneration worse on the left. Prominent epidural fat. Spinal stenosis at this level. Left foraminal stenosis that could compress the left L4 nerve. Edematous facet arthropathy which is likely painful. Pronounced discogenic edema of the L4 and L5 vertebral bodies likely contributing to back pain.  L5-S1: Mild bulging of the disc. Mild facet osteoarthritis. No  canal or foraminal stenosis.  Edematous changes in the right sacroiliac region as noted above, not fully imaged.  IMPRESSION: Scoliosis convex to the left with the apex at L1-2.  The most advanced pathology is at the L4-5 level. There is disc degeneration more pronounced on the left. There is facet arthropathy more pronounced on the left. Multifactorial spinal stenosis and left neural foraminal stenosis. Neural compression is possible this level, particularly of the left L4 nerve in the stenotic foramen. Edematous facet arthropathy could also be painful. Discogenic edema throughout the vertebral bodies likely contributes to low back pain.  L3-4: Multifactorial stenosis due to endplate osteophytes and bulging of the disc more prominent towards the left, with bilateral facet degeneration and hypertrophy somewhat more pronounced on the left. Neural compression could occur on either or both sides. Discogenic edematous changes of the endplates could contribute to back pain.  L2-3: Right lateral recess and foraminal narrowing due to osteophytes, bulging disc and facet arthropathy could cause right-sided neural compression.  Lesser right-sided degenerative changes at T12-L1 and L1-2 without apparent neural compression.  Edematous changes on the right affecting the iliac bone and sacrum probably indicating edematous right sacroiliac arthropathy. This could certainly be symptomatic. This area was not studied completely.   Electronically Signed By: Nelson Chimes M.D. On: 08/06/2018 11:16  Lumbar MR wo contrast: No valid procedures specified. Lumbar MR w/wo contrast: No results found for this or any previous visit.  Lumbar MR w/wo contrast: No results found for this or any previous visit.  Lumbar MR w contrast: No results found for this or any previous visit.  Lumbar CT wo contrast: No results found for this or any previous visit.  Lumbar CT w/wo contrast: No results found for  this or any previous visit.  Lumbar CT w/wo contrast: No results found for this or any previous visit.  Lumbar CT w contrast: No results found for this or any previous visit.  Lumbar DG 1V: No results found for this or any previous visit.  Lumbar DG 1V (Clearing): No results found for this or any previous visit.  Lumbar DG 2-3V (Clearing): No results found for  this or any previous visit.  Lumbar DG 2-3 views: Results for orders placed during the hospital encounter of 01/15/18  DG Lumbar Spine 2-3 Views  Narrative CLINICAL DATA:  Back and left leg pain.  EXAM: LUMBAR SPINE - 2-3 VIEW  COMPARISON:  01/07/2018.  FINDINGS: Five non-rib-bearing lumbar vertebrae. Stable marked levoconvex thoracolumbar rotary scoliosis. Multilevel degenerative changes. No fractures or pars defects. Bilateral pelvic phleboliths.  IMPRESSION: Stable scoliosis and multilevel degenerative changes. No acute abnormality.   Electronically Signed By: Claudie Revering M.D. On: 01/16/2018 00:53  Lumbar DG (Complete) 4+V: Results for orders placed during the hospital encounter of 11/27/16  DG Lumbar Spine Complete  Narrative CLINICAL DATA:  Chronic low back and lumbar spine pain.  EXAM: LUMBAR SPINE - COMPLETE 4+ VIEW  COMPARISON:  11/02/2014 and prior radiographs  FINDINGS: A moderate to severe severe apex left thoracolumbar scoliosis is again identified.  Mild to moderate degenerative disc disease and spondylosis in the lower a lumbar spine noted greatest at L3-4 and L4-5.  There is no evidence of cysts acute fracture or subluxation.  No focal bony lesions or spondylolysis noted.  IMPRESSION: 1. No evidence of acute abnormality 2. Scoliosis and degenerative changes again noted.   Electronically Signed By: Margarette Canada M.D. On: 11/27/2016 16:53        Lumbar DG F/E views: No results found for this or any previous visit.        Lumbar DG Bending views: No results found for this or any  previous visit.        Lumbar DG Myelogram views: No results found for this or any previous visit.  Lumbar DG Myelogram: No results found for this or any previous visit.  Lumbar DG Myelogram: No results found for this or any previous visit.  Lumbar DG Myelogram: No results found for this or any previous visit.  Lumbar DG Myelogram Lumbosacral: No results found for this or any previous visit.  Lumbar DG Diskogram views: No results found for this or any previous visit.  Lumbar DG Diskogram views: No results found for this or any previous visit.  Lumbar DG Epidurogram OP: No results found for this or any previous visit.  Lumbar DG Epidurogram IP: No valid procedures specified.  Sacroiliac Joint Imaging: Sacroiliac Joint DG: No results found for this or any previous visit.  Sacroiliac Joint MR w/wo contrast: No results found for this or any previous visit.  Sacroiliac Joint MR wo contrast: No results found for this or any previous visit.   Spine Imaging: Whole Spine DG Myelogram views: No results found for this or any previous visit.  Whole Spine MR Mets screen: No results found for this or any previous visit.  Whole Spine MR Mets screen: No results found for this or any previous visit.  Whole Spine MR w/wo: No results found for this or any previous visit.  MRA Spinal Canal w/ cm: No results found for this or any previous visit.  MRA Spinal Canal wo/ cm: No valid procedures specified. MRA Spinal Canal w/wo cm: No results found for this or any previous visit.  Spine Outside MR Films: No results found for this or any previous visit.  Spine Outside CT Films: No results found for this or any previous visit.  CT-Guided Biopsy: No results found for this or any previous visit.  CT-Guided Needle Placement: No results found for this or any previous visit.  DG Spine outside: No results found for this or any previous visit.  IR Spine outside: No results found for this or any  previous visit.  NM Spine outside: No results found for this or any previous visit.   Hip Imaging: Hip-R MR w contrast: No results found for this or any previous visit.  Hip-L MR w contrast: No results found for this or any previous visit.  Hip-R MR w/wo contrast: No results found for this or any previous visit.  Hip-L MR w/wo contrast: No results found for this or any previous visit.  Hip-R MR wo contrast: No results found for this or any previous visit.  Hip-L MR wo contrast: No results found for this or any previous visit.  Hip-R CT w contrast: No results found for this or any previous visit.  Hip-L CT w contrast: No results found for this or any previous visit.  Hip-R CT w/wo contrast: No results found for this or any previous visit.  Hip-L CT w/wo contrast: No results found for this or any previous visit.  Hip-R CT wo contrast: No results found for this or any previous visit.  Hip-L CT wo contrast: No results found for this or any previous visit.  Hip-R DG 2-3 views: No results found for this or any previous visit.  Hip-L DG 2-3 views: No results found for this or any previous visit.  Hip-R DG Arthrogram: No results found for this or any previous visit.  Hip-L DG Arthrogram: No results found for this or any previous visit.  Hip-B DG Bilateral: No results found for this or any previous visit.   Knee Imaging: Knee-R MR w contrast: No results found for this or any previous visit.  Knee-L MR w/o contrast: No results found for this or any previous visit.  Knee-R MR w/wo contrast: No results found for this or any previous visit.  Knee-L MR w/wo contrast: No results found for this or any previous visit.  Knee-R MR wo contrast: No results found for this or any previous visit.  Knee-L MR wo contrast: No results found for this or any previous visit.  Knee-R CT w contrast: No results found for this or any previous visit.  Knee-L CT w contrast: No results found for  this or any previous visit.  Knee-R CT w/wo contrast: No results found for this or any previous visit.  Knee-L CT w/wo contrast: No results found for this or any previous visit.  Knee-R CT wo contrast: No results found for this or any previous visit.  Knee-L CT wo contrast: No results found for this or any previous visit.  Knee-R DG 1-2 views: No results found for this or any previous visit.  Knee-L DG 1-2 views: No results found for this or any previous visit.  Knee-R DG 3 views: No results found for this or any previous visit.  Knee-L DG 3 views: No results found for this or any previous visit.  Knee-R DG 4 views: No results found for this or any previous visit.  Knee-L DG 4 views: No results found for this or any previous visit.  Knee-R DG Arthrogram: No results found for this or any previous visit.  Knee-L DG Arthrogram: No results found for this or any previous visit.   Ankle Imaging: Ankle-R DG Complete: No results found for this or any previous visit.  Ankle-L DG Complete: No results found for this or any previous visit.   Foot Imaging: Foot-R DG Complete: Results for orders placed during the hospital encounter of 07/06/14  DG Foot Complete Right  Narrative CLINICAL  DATA:  Injury  EXAM: RIGHT FOOT COMPLETE - 3+ VIEW  COMPARISON:  None.  FINDINGS: There is no evidence of fracture or dislocation. There is no evidence of arthropathy or other focal bone abnormality. Small calcaneal spur. Soft tissues are unremarkable. No radiodense foreign body or subcutaneous gas.  IMPRESSION: Negative.   Electronically Signed By: Lucrezia Europe M.D. On: 07/06/2014 17:28  Foot-L DG Complete: No results found for this or any previous visit.   Elbow Imaging: Elbow-R DG Complete: No results found for this or any previous visit.  Elbow-L DG Complete: No results found for this or any previous visit.   Wrist Imaging: Wrist-R DG Complete: Results for orders placed during  the hospital encounter of 10/26/15  DG Wrist Complete Right  Narrative CLINICAL DATA:  Aching pain in RIGHT wrist, no injury, can hardly using any more, greatest pain laterally, rheumatoid nodule  EXAM: RIGHT WRIST - COMPLETE 3+ VIEW  COMPARISON:  None  FINDINGS: Osseous demineralization.  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  Small rounded soft tissue calcification at the ulnar aspect of the RIGHT hand at the level of the proximal second metacarpal.  No acute fracture, dislocation or bone destruction.  IMPRESSION: No acute osseous abnormalities.   Electronically Signed By: Lavonia Dana M.D. On: 10/26/2015 16:56  Wrist-L DG Complete: No results found for this or any previous visit.   Hand Imaging: Hand-R DG Complete: No results found for this or any previous visit.  Hand-L DG Complete: No results found for this or any previous visit.   Complexity Note: Imaging results reviewed. Results shared with Ms. Dibbern, using Layman's terms.                         ROS  Cardiovascular: {Hx; Cardiovascular History:210120525} Pulmonary or Respiratory: {Hx; Pumonary and/or Respiratory History:210120523} Neurological: {Hx; Neurological:210120504} Psychological-Psychiatric: {Hx; Psychological-Psychiatric History:210120512} Gastrointestinal: {Hx; Gastrointestinal:210120527} Genitourinary: {Hx; Genitourinary:210120506} Hematological: {Hx; Hematological:210120510} Endocrine: {Hx; Endocrine history:210120509} Rheumatologic: {Hx; Rheumatological:210120530} Musculoskeletal: {Hx; Musculoskeletal:210120528} Work History: {Hx; Work history:210120514}  Allergies  Ms. Kopp is allergic to flagyl [metronidazole].  Laboratory Chemistry Profile   Renal Lab Results  Component Value Date   BUN 8 09/08/2020   CREATININE 0.55 09/08/2020   BCR 9 05/22/2016   GFRAA >60 10/17/2018   GFRNONAA >60 09/08/2020   SPECGRAV 1.006 12/14/2015   PHUR 5.5 12/14/2015   PROTEINUR  NEGATIVE 06/02/2021     Electrolytes Lab Results  Component Value Date   NA 134 (L) 09/08/2020   K 3.9 09/08/2020   CL 105 09/08/2020   CALCIUM 8.8 (L) 09/08/2020   MG 1.8 08/22/2014     Hepatic Lab Results  Component Value Date   AST 137 (H) 09/08/2020   ALT 85 (H) 09/08/2020   ALBUMIN 4.5 09/08/2020   ALKPHOS 71 09/08/2020     ID Lab Results  Component Value Date   HIV Non Reactive 08/15/2017   PREGTESTUR NEGATIVE 05/01/2017     Bone No results found for: "VD25OH", "VD125OH2TOT", "EL8590BP1", "PE1624EC9", "25OHVITD1", "25OHVITD2", "50HKUVJD0", "TESTOFREE", "TESTOSTERONE"   Endocrine Lab Results  Component Value Date   GLUCOSE 95 09/08/2020   GLUCOSEU NEGATIVE 06/02/2021   HGBA1C 5.2 08/14/2017   TSH 0.670 02/28/2016     Neuropathy Lab Results  Component Value Date   VITAMINB12 364 10/18/2015   HGBA1C 5.2 08/14/2017   HIV Non Reactive 08/15/2017     CNS No results found for: "COLORCSF", "APPEARCSF", "RBCCOUNTCSF", "WBCCSF", "POLYSCSF", "LYMPHSCSF", "EOSCSF", "PROTEINCSF", "GLUCCSF", "JCVIRUS", "  CSFOLI", "IGGCSF", "LABACHR", "ACETBL"   Inflammation (CRP: Acute  ESR: Chronic) Lab Results  Component Value Date   ESRSEDRATE CANCELED 10/18/2015   LATICACIDVEN 0.9 01/15/2018     Rheumatology Lab Results  Component Value Date   RF <10.0 12/19/2015   LABURIC 3.2 11/15/2015     Coagulation Lab Results  Component Value Date   INR 0.9 09/28/2011   LABPROT 12.2 09/28/2011   PLT 149 (L) 09/08/2020     Cardiovascular Lab Results  Component Value Date   CKTOTAL 170 11/09/2011   CKMB 1.4 11/09/2011   TROPONINI <0.03 02/04/2018   HGB 12.8 09/08/2020   HCT 36.4 09/08/2020     Screening Lab Results  Component Value Date   HIV Non Reactive 08/15/2017   PREGTESTUR NEGATIVE 05/01/2017     Cancer No results found for: "CEA", "CA125", "LABCA2"   Allergens No results found for: "ALMOND", "APPLE", "ASPARAGUS", "AVOCADO", "BANANA", "BARLEY", "BASIL",  "BAYLEAF", "GREENBEAN", "LIMABEAN", "WHITEBEAN", "BEEFIGE", "REDBEET", "BLUEBERRY", "BROCCOLI", "CABBAGE", "MELON", "CARROT", "CASEIN", "CASHEWNUT", "CAULIFLOWER", "CELERY"     Note: Lab results reviewed.  [place]  Drug: Ms. Hanback  reports current drug use. Drug: Cocaine. Alcohol:  reports current alcohol use. Tobacco:  reports that she has been smoking cigarettes. She has a 9.75 pack-year smoking history. She has never used smokeless tobacco. Medical:  has a past medical history of Acute renal failure (ARF) ([place]) (01/07/2018), Allergy, Anemia, Arthritis, Asthma, Bipolar disorder ([place]), Cocaine abuse in remission ([place]), Depression, Dyspnea, Eczema, Nervous, Schizophrenia ([place]), Scoliosis, Seizure ([place]), Seizures ([place]), Stroke ([place]) (2007), and Tobacco use. Family: family history includes Epilepsy in her maternal grandmother and mother; Heart attack in her maternal grandmother; Heart disease in her father; Seizures in her mother.  Past Surgical History:  Procedure Laterality Date   APPENDECTOMY  2000   BREAST BIOPSY Right 03/28/2021   right breast stereo ribbon clip-path pending   BREAST CYST EXCISION Bilateral 05/29/2016   Procedure: MASS EXCISION CHEST WALL;  Surgeon: Clayburn Pert, MD;  Location: [facility] ORS;  Service: General;  Laterality: Bilateral;   DILATION AND CURETTAGE OF UTERUS     Active Ambulatory Problems    Diagnosis Date Noted   Seizure ([place]) 08/21/2014   Cocaine abuse in remission ([place]) 08/22/2014   Eczema 05/16/2015   Right wrist pain 03/15/2016   Cyst of skin of breast 03/18/2016   Mass involving both sides of chest wall 08/26/2017   Low back pain 11/27/2016   Spasm of muscle of lower back 11/27/2016   Somatic dysfunction of spine, lumbar 12/11/2016   Somatic dysfunction of spine, thoracic 12/11/2016   Acute renal failure (ARF) ([place]) 01/07/2018   Bipolar disorder ([place]) 07/14/2017   Lumbar radicular pain (left) 06/10/2018   History of cocaine abuse ([place]) 06/10/2018    Polysubstance abuse ([place]) 06/10/2018   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   Diplopia 06/26/2021   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   S/P eye surgery 08/17/2021   Mood disorder ([place]) 07/14/2017   Chronic pain syndrome 08/19/2021   Pharmacologic therapy 08/19/2021   Disorder of skeletal system 08/19/2021   Problems influencing health status 08/19/2021   Resolved Ambulatory Problems    Diagnosis Date Noted   No Resolved Ambulatory Problems   Past Medical History:  Diagnosis Date   Allergy    Anemia    Arthritis    Asthma    Depression    Dyspnea    Nervous    Schizophrenia ([place])    Scoliosis    Seizures ([place])  Stroke [facility]) 2007   Tobacco use    Constitutional Exam  General appearance: Well nourished, well developed, and well hydrated. In no apparent acute distress There were no vitals filed for this visit. BMI Assessment: Estimated body mass index is 18.88 kg/m as calculated from the following:   Height as of 07/22/21: $RemoveBef'5\' 4"'jjSylAVDvo$  (1.626 m).   Weight as of 07/22/21: 110 lb (49.9 kg).  BMI interpretation table: BMI level Category Range association with higher incidence of chronic pain  <18 kg/m2 Underweight   18.5-24.9 kg/m2 Ideal body weight   25-29.9 kg/m2 Overweight Increased incidence by 20%  30-34.9 kg/m2 Obese (Class I) Increased incidence by 68%  35-39.9 kg/m2 Severe obesity (Class II) Increased incidence by 136%  >40 kg/m2 Extreme obesity (Class III) Increased incidence by 254%   Patient's current BMI Ideal Body weight  There is no height or weight on file to calculate BMI. Patient weight not recorded   BMI Readings from Last 4 Encounters:  07/22/21 18.88 kg/m  06/02/21 16.65 kg/m  11/20/20 16.82 kg/m  11/19/20 19.14 kg/m   Wt Readings from Last 4 Encounters:  07/22/21 110 lb (49.9 kg)  06/02/21 97 lb (44 kg)  11/20/20 98 lb (44.5 kg)  11/19/20 98 lb (44.5 kg)    Psych/Mental status: Alert, oriented x 3 (person, place, & time)       Eyes:  PERLA Respiratory: No evidence of acute respiratory distress  Assessment  Primary Diagnosis & Pertinent Problem List: The primary encounter diagnosis was Chronic pain syndrome. Diagnoses of Pharmacologic therapy, Disorder of skeletal system, and Problems influencing health status were also pertinent to this visit.  Visit Diagnosis (New problems to examiner): 1. Chronic pain syndrome   2. Pharmacologic therapy   3. Disorder of skeletal system   4. Problems influencing health status    Plan of Care (Initial workup plan)  Note: Ms. Duprey was reminded that as per protocol, today's visit has been an evaluation only. We have not taken over the patient's controlled substance management.  Problem-specific plan: No problem-specific Assessment & Plan notes found for this encounter.  Lab Orders  No laboratory test(s) ordered today   Imaging Orders  No imaging studies ordered today   Referral Orders  No referral(s) requested today   Procedure Orders    No procedure(s) ordered today   Pharmacotherapy (current): Medications ordered:  No orders of the defined types were placed in this encounter.  Medications administered during this visit: Rolla A. Date had no medications administered during this visit.   Pharmacological management options:  Opioid Analgesics: The patient was informed that there is no guarantee that she would be a candidate for opioid analgesics. The decision will be made following CDC guidelines. This decision will be based on the results of diagnostic studies, as well as Ms. Waren's risk profile.   Membrane stabilizer: To be determined at a later time  Muscle relaxant: To be determined at a later time  NSAID: To be determined at a later time  Other analgesic(s): To be determined at a later time   Interventional management options: Ms. Nolet was informed that there is no guarantee that she would be a candidate for interventional therapies. The decision will be  based on the results of diagnostic studies, as well as Ms. [place]'s risk profile.  Procedure(s) under consideration:  Pending results of ordered studies      Interventional Therapies  Risk  Complexity Considerations:   Estimated body mass index is 18.88 kg/m as calculated  from the following:   Height as of 07/22/21: $RemoveBef'5\' 4"'HaJJbkrFLI$  (1.626 m).   Weight as of 07/22/21: 110 lb (49.9 kg). WNL   Planned  Pending:   Pending further evaluation   Under consideration:   ***   Completed:   None at this time   Therapeutic  Palliative (PRN) options:   None established      Provider-requested follow-up: No follow-ups on file.  Future Appointments  Date Time Provider [place]  08/20/2021  9:00 AM Milinda Pointer, MD [facility] None    Note by: Gaspar Cola, MD Date: 08/20/2021; Time: 2:56 PM

## 2021-08-20 ENCOUNTER — Ambulatory Visit: Payer: Medicare Other | Attending: Pain Medicine | Admitting: Pain Medicine

## 2021-08-20 ENCOUNTER — Encounter: Payer: Self-pay | Admitting: Pain Medicine

## 2021-08-20 VITALS — BP 144/79 | HR 96 | Temp 96.8°F | Resp 16 | Ht <= 58 in | Wt 95.0 lb

## 2021-08-20 DIAGNOSIS — M545 Low back pain, unspecified: Secondary | ICD-10-CM | POA: Diagnosis present

## 2021-08-20 DIAGNOSIS — Z789 Other specified health status: Secondary | ICD-10-CM | POA: Diagnosis present

## 2021-08-20 DIAGNOSIS — M79605 Pain in left leg: Secondary | ICD-10-CM | POA: Insufficient documentation

## 2021-08-20 DIAGNOSIS — R892 Abnormal level of other drugs, medicaments and biological substances in specimens from other organs, systems and tissues: Secondary | ICD-10-CM | POA: Insufficient documentation

## 2021-08-20 DIAGNOSIS — F1411 Cocaine abuse, in remission: Secondary | ICD-10-CM | POA: Insufficient documentation

## 2021-08-20 DIAGNOSIS — M5137 Other intervertebral disc degeneration, lumbosacral region: Secondary | ICD-10-CM | POA: Insufficient documentation

## 2021-08-20 DIAGNOSIS — R937 Abnormal findings on diagnostic imaging of other parts of musculoskeletal system: Secondary | ICD-10-CM | POA: Diagnosis present

## 2021-08-20 DIAGNOSIS — M19011 Primary osteoarthritis, right shoulder: Secondary | ICD-10-CM | POA: Insufficient documentation

## 2021-08-20 DIAGNOSIS — G8929 Other chronic pain: Secondary | ICD-10-CM | POA: Diagnosis present

## 2021-08-20 DIAGNOSIS — M899 Disorder of bone, unspecified: Secondary | ICD-10-CM | POA: Insufficient documentation

## 2021-08-20 DIAGNOSIS — M4807 Spinal stenosis, lumbosacral region: Secondary | ICD-10-CM | POA: Insufficient documentation

## 2021-08-20 DIAGNOSIS — M48061 Spinal stenosis, lumbar region without neurogenic claudication: Secondary | ICD-10-CM | POA: Diagnosis present

## 2021-08-20 DIAGNOSIS — F1910 Other psychoactive substance abuse, uncomplicated: Secondary | ICD-10-CM | POA: Diagnosis present

## 2021-08-20 DIAGNOSIS — M47817 Spondylosis without myelopathy or radiculopathy, lumbosacral region: Secondary | ICD-10-CM | POA: Diagnosis present

## 2021-08-20 DIAGNOSIS — Z765 Malingerer [conscious simulation]: Secondary | ICD-10-CM | POA: Diagnosis present

## 2021-08-20 DIAGNOSIS — M461 Sacroiliitis, not elsewhere classified: Secondary | ICD-10-CM | POA: Diagnosis present

## 2021-08-20 DIAGNOSIS — M5136 Other intervertebral disc degeneration, lumbar region: Secondary | ICD-10-CM | POA: Insufficient documentation

## 2021-08-20 DIAGNOSIS — M419 Scoliosis, unspecified: Secondary | ICD-10-CM | POA: Insufficient documentation

## 2021-08-20 DIAGNOSIS — M47816 Spondylosis without myelopathy or radiculopathy, lumbar region: Secondary | ICD-10-CM | POA: Insufficient documentation

## 2021-08-20 DIAGNOSIS — G894 Chronic pain syndrome: Secondary | ICD-10-CM | POA: Diagnosis present

## 2021-08-20 DIAGNOSIS — Z79899 Other long term (current) drug therapy: Secondary | ICD-10-CM | POA: Insufficient documentation

## 2021-11-04 ENCOUNTER — Other Ambulatory Visit: Payer: Self-pay

## 2021-11-04 ENCOUNTER — Emergency Department: Payer: Medicare Other

## 2021-11-04 ENCOUNTER — Emergency Department
Admission: EM | Admit: 2021-11-04 | Discharge: 2021-11-04 | Disposition: A | Discharge status: Home / Self Care | Payer: Medicare Other | Attending: Emergency Medicine | Admitting: Emergency Medicine

## 2021-11-04 DIAGNOSIS — M545 Low back pain, unspecified: Secondary | ICD-10-CM | POA: Diagnosis present

## 2021-11-04 DIAGNOSIS — G8929 Other chronic pain: Secondary | ICD-10-CM

## 2021-11-04 DIAGNOSIS — Z7982 Long term (current) use of aspirin: Secondary | ICD-10-CM | POA: Diagnosis not present

## 2021-11-04 DIAGNOSIS — Z87891 Personal history of nicotine dependence: Secondary | ICD-10-CM | POA: Diagnosis not present

## 2021-11-04 DIAGNOSIS — J45909 Unspecified asthma, uncomplicated: Secondary | ICD-10-CM | POA: Insufficient documentation

## 2021-11-04 MED ORDER — CELECOXIB 100 MG PO CAPS: 100.0000 mg | ORAL_CAPSULE | Freq: Two times a day (BID) | ORAL | 0 refills | Status: AC

## 2021-11-04 NOTE — ED Triage Notes (Signed)
Per EMS report, Patient comes from home, c/o new onset lumbar pain that began 4 days ago and has gradually worsened. Patient c/o not being able to sleep due to the pain and was tearful when EMTs arrived. Patient was given 4mg  Zofran IV and 41mcg of Fentanyl IV by EMT and pain dropped from 9/10 to 5/10.  Patient was placed on 2L O2 via [place] after Fentanyl administration by EMT for a drop in sats. Patient is alert and oriented upon arrival, calm and cooperative.  EMT VS:  160/100 110 pulse 94% on 2L O2  22g IV in Left forearm, placed by EMT

## 2021-11-04 NOTE — ED Notes (Signed)
Patient's sats went down to 88% on room air. Patient was drowsy. Patient placed on 2L O2 via [place]. Sats are now 94%.

## 2021-11-04 NOTE — ED Notes (Signed)
Pt placed on a purwick

## 2021-11-04 NOTE — Discharge Instructions (Addendum)
You have been seen today in the emergency room for lower back pain.  Your x-ray shows no acute changes from previous x-rays.  I will be giving you a short course of pain medication to use and he can follow-up with your primary care provider next week. I am also providing you the contact information for orthopedics that she requested.

## 2021-11-04 NOTE — ED Provider Notes (Signed)
[facility] Emergency Department Provider Note   ____________________________________________   Event Date/Time   First MD Initiated Contact with Patient 11/04/21 1843     (approximate)  I have reviewed the triage vital signs and the nursing notes.   HISTORY  Chief Complaint Back Pain    HPI [name] is a 56 y.o. female presents to the emergency room via EMS for complaint of lower back pain.  Patient reports that she awoke with lower back pain approximately 4 days ago.  She denies any injury/falls.  Patient reports that the pain has been a 8-10 out of 10 consistently for the past 4 days.  She reports that she is not able to sleep due to the pain.  She reports that intermittently she is having to use a walker to walk due to her inability to stand erect.  She denies any radiation of pain to lower extremities.  She denies any numbness or tingling in extremities.  However, she does endorse that she is having some burning sensations in the right hip/thigh.  She denies any changes to bowel or bladder habits. Patient reports that she has been taking Tylenol/ibuprofen/meloxicam for the discomfort without relief. Patient was given fentanyl by EMS and reports that the pain has decreased now to a 5 out of 10.   Past Medical History:  Diagnosis Date   Acute renal failure (ARF) (HCC) 01/07/2018   Allergy    Anemia    Arthritis    Asthma    NO INHALERS   Bipolar disorder (HCC)    Cocaine abuse in remission (HCC)    Depression    Dyspnea    Eczema    Nervous    Schizophrenia (HCC)    Scoliosis    Seizure (HCC)    Seizures (HCC)    Last seizure, May 20, 2016-PT WAS ALONE-UNSURE LENGTH OF SEIZURE-SCRAPED NOSE AND LIP   Stroke (HCC) 2007   Tobacco use     Patient Active Problem List   Diagnosis Date Noted   Osteoarthritis of shoulder (Right) 08/20/2021   Abnormal MRI, lumbar spine (08/06/2018) 08/20/2021   Osteoarthritis of facet joint of lumbar spine  08/20/2021   DDD (degenerative disc disease), lumbosacral 08/20/2021   Lumbosacral facet arthropathy (Multilevel) (Bilateral) 08/20/2021   Discogenic low back pain 08/20/2021   Sacroiliitis (HCC) (Right) 08/20/2021   Lumbar central spinal stenosis, 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, L4-5) 08/20/2021   Lumbosacral foraminal stenosis (Right: T12-L1, L1-2, L2-3) (Bilateral: L3-4) (Left: L4-5) 08/20/2021   Chronic low back pain (Bilateral) w/ sciatica (Left) 08/20/2021   Chronic lower extremity pain (2ry area of Pain) (Left) 08/20/2021   Scoliosis of lumbar spine 08/20/2021   Long term prescription benzodiazepine use 08/20/2021   Abnormal drug screen (multiple) 08/20/2021   Drug-seeking behavior 08/20/2021   Chronic pain syndrome 08/19/2021   Pharmacologic therapy 08/19/2021   Disorder of skeletal system 08/19/2021   Problems influencing health status 08/19/2021   S/P eye surgery 08/17/2021   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   Diplopia 06/26/2021   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   Lumbar radicular pain (left) 06/10/2018   History of cocaine abuse (HCC) 06/10/2018   Polysubstance abuse (HCC) 06/10/2018   Acute renal failure (ARF) (HCC) 01/07/2018   Mass involving both sides of chest wall 08/26/2017   Bipolar disorder (HCC) 07/14/2017   Mood disorder (HCC) 07/14/2017   Somatic dysfunction of spine, lumbar 12/11/2016   Somatic dysfunction of spine, thoracic 12/11/2016   Chronic low back pain (  1ry area of Pain) (Bilateral) w/o sciatica 11/27/2016   Spasm of muscle of lower back 11/27/2016   Cyst of skin of breast 03/18/2016   Right wrist pain 03/15/2016   Eczema 05/16/2015   Cocaine abuse in remission (HCC) 08/22/2014   Seizure (HCC) 08/21/2014    Past Surgical History:  Procedure Laterality Date   APPENDECTOMY  2000   BREAST BIOPSY Right 03/28/2021   right breast stereo ribbon clip-path pending   BREAST CYST EXCISION Bilateral 05/29/2016   Procedure: MASS EXCISION CHEST  WALL;  Surgeon: Ricarda Frame, MD;  Location: [facility] ORS;  Service: General;  Laterality: Bilateral;   DILATION AND CURETTAGE OF UTERUS      Prior to Admission medications   Medication Sig Start Date End Date Taking? Authorizing Provider  celecoxib (CELEBREX) 100 MG capsule Take 1 capsule (100 mg total) by mouth 2 (two) times daily for 10 days. 11/04/21 11/14/21 Yes Herschell Dimes, NP  ACETAMINOPHEN EXTRA STRENGTH 500 MG tablet Take 500 mg by mouth every 6 (six) hours as needed. 05/03/21   [provider]  albuterol (VENTOLIN HFA) 108 (90 Base) MCG/ACT inhaler  08/11/18   [provider]  aspirin EC 81 MG tablet Take 81 mg by mouth daily. 06/22/21   [provider]  busPIRone (BUSPAR) 7.5 MG tablet Take 7.5 mg by mouth 2 (two) times daily. 08/03/21   [provider]  clotrimazole-betamethasone (LOTRISONE) cream APPLY TOPICALLY TWICE DAILY FOR 14 DAYS 08/20/19   Malfi, Jodelle Gross, FNP  divalproex (DEPAKOTE) 500 MG DR tablet Take 1 tab in the morning and 2 tabs at night 04/29/18   Sharman Cheek, MD  gabapentin (NEURONTIN) 100 MG capsule Take 1 capsule (100 mg total) by mouth 3 (three) times daily. Patient not taking: Reported on 08/31/2018 08/28/18 08/28/19  Nita Sickle, MD  phenytoin (DILANTIN) 100 MG ER capsule Take 2 capsules (200 mg total) by mouth at bedtime. 04/29/18 06/30/19  Sharman Cheek, MD  phenytoin (DILANTIN) 100 MG ER capsule  07/13/20   [provider]  QUEtiapine (SEROQUEL) 100 MG tablet Take 100 mg by mouth at bedtime. 08/03/21   [provider]  vitamin B-12 (CYANOCOBALAMIN) 1000 MCG tablet  05/03/21   [provider]  VITAMIN D-1000 MAX ST 25 MCG (1000 UT) tablet Take 1,000 Units by mouth daily. 06/22/21   [provider]    Allergies Flagyl [metronidazole]  Family History  Problem Relation Age of Onset   Epilepsy Mother    Seizures Mother    Heart disease Father    Epilepsy Maternal Grandmother     Heart attack Maternal Grandmother     Social History Social History   Tobacco Use   Smoking status: Former    Packs/day: 0.25    Years: 39.00    Total pack years: 9.75    Types: Cigarettes   Smokeless tobacco: Never   Tobacco comments:    Interested in Museum/gallery exhibitions officer Use: Never used  Substance Use Topics   Alcohol use: Yes    Alcohol/week: 0.0 standard drinks of alcohol    Comment: "sometimes"    Drug use: Not Currently    Types: Cocaine    Review of Systems  Constitutional: No fever/chills Eyes: No visual changes. ENT: No sore throat. Cardiovascular: Denies chest pain. Respiratory: Denies shortness of breath. Gastrointestinal: No abdominal pain.  No nausea, no vomiting.  No diarrhea.  No constipation. Genitourinary: Negative for dysuria. Musculoskeletal: Positive for back pain Skin:  Negative for rash. Neurological: Negative for headaches, focal weakness or numbness.   ____________________________________________   PHYSICAL EXAM:  VITAL SIGNS: ED Triage Vitals [11/04/21 1826]  Enc Vitals Group     BP 120/72     Pulse Rate 86     Resp 16     Temp 99.3 F (37.4 C)     Temp Source Oral     SpO2 93 %     Weight 110 lb (49.9 kg)     Height 4\' 9"  (1.448 m)     Head Circumference      Peak Flow      Pain Score      Pain Loc      Pain Edu?      Excl. in GC?    Constitutional: Alert and oriented. Well appearing and in no acute distress. Eyes: Conjunctivae are normal. PERRL. EOMI. Head: Atraumatic. Nose: No congestion/rhinnorhea. Mouth/Throat: Mucous membranes are moist.  Oropharynx non-erythematous. Neck: No stridor.   Cardiovascular: Normal rate, regular rhythm. Grossly normal heart sounds.  Good peripheral circulation. Respiratory: Normal respiratory effort.  No retractions. Lungs CTAB. Gastrointestinal: Soft and nontender. No distention. No abdominal bruits. No CVA tenderness. Musculoskeletal: There is no abnormality noted to the  lumbar spine.  There is no redness/swelling noted to the lumbar region.  No bruising is noted to the lumbar spine.  There is minimal pain with palpation.  Patient has limited range of motion. Neurologic:  Normal speech and language. No gross focal neurologic deficits are appreciated. No gait instability. Skin:  Skin is warm, dry and intact. No rash noted. Psychiatric: Mood and affect are normal. Speech and behavior are normal.  ____________________________________________   LABS (all labs ordered are listed, but only abnormal results are displayed)  Labs Reviewed - No data to display ____________________________________________  EKG   ____________________________________________  RADIOLOGY  ED MD interpretation: X-ray of the lumbar spine was reviewed by me and read by radiologist  Official radiology report(s): DG Lumbar Spine 2-3 Views  Result Date: 11/04/2021 CLINICAL DATA:  Lower back pain. EXAM: LUMBAR SPINE - 2-3 VIEW COMPARISON:  Lumbar spine radiographs 01/16/2018 FINDINGS: There are tiny ribs at the vertebral body considered T12. Distal to this there are 5 non-rib-bearing lumbar-type vertebral bodies. Moderate levocurvature centered at T12-L1. Mild right T12-L1 disc space narrowing. Moderate left-greater-than-right L3-4 and severe left L4-5 and L5-S1 disc space narrowing. Moderate left L3-4 endplate osteophytes. There is again straightening of the normal lumbar lordosis. No sagittal spondylolisthesis. Vertebral body heights are maintained. IMPRESSION: 1. Moderate levocurvature centered at T12-L1. 2. Moderate to severe multilevel degenerative disc and endplate changes. Electronically Signed   By: 01/18/2018 M.D.   On: 11/04/2021 19:31    ____________________________________________   PROCEDURES  Procedure(s) performed: None  Procedures  Critical Care performed: No  ____________________________________________   INITIAL IMPRESSION / ASSESSMENT AND PLAN / ED  COURSE     MILAYAH KRELL is a 56 y.o. female presents to the emergency room via EMS for complaint of lower back pain.  Patient reports that she awoke with lower back pain approximately 4 days ago.  She denies any injury/falls.  Patient reports that the pain has been a 8-10 out of 10 consistently for the past 4 days.  She reports that she is not able to sleep due to the pain.  She reports that intermittently she is having to use a walker to walk due to her inability to stand erect.  She denies any radiation of pain  to lower extremities.  She denies any numbness or tingling in extremities.  However, she does endorse that she is having some burning sensations in the right hip/thigh.  She denies any changes to bowel or bladder habits. Patient reports that she has been taking Tylenol/ibuprofen/meloxicam for the discomfort without relief. Patient was given fentanyl by EMS and reports that the pain has decreased now to a 5 out of 10.   Patient has history of degenerative disc disease in the lumbar region as well as facet arthropathy and spinal stenosis.  We will obtain x-ray of lumbar spine.  X-ray of lumbar spine shows Moderate levocurvature centered at T12-L1 and moderate to severe multilevel degenerative disc and endplate changes. Overall this is relatively the same as previous in 2020. I do not feel that further imaging is necessary today.  Patient reported to me that she had spoke to her primary care provider and was told to follow-up with them in the office this coming week.  I feel that this is the best course of action.  She should most likely follow-up with orthopedics as well.  I will provide her with information for orthopedics on-call, so that she can make appointment to see them if she would like.  Patient has been seen by pain management center as recent as July of this year chronic pain.  When patient realized that she would not be receiving pain medication she left the office without  further intervention.  Today I will discharge patient with a short course of celebrex and she needs to follow-up with her primary care provider next week.  Patient will be discharged home in stable condition at this time.      ____________________________________________   FINAL CLINICAL IMPRESSION(S) / ED DIAGNOSES  Final diagnoses:  Chronic bilateral low back pain without sciatica     ED Discharge Orders          Ordered    celecoxib (CELEBREX) 100 MG capsule  2 times daily        11/04/21 1946             Note:  This document was prepared using Dragon voice recognition software and may include unintentional dictation errors.     Willaim Rayas, NP 11/04/21 Leonides Schanz    Duffy Bruce, MD 11/04/21 2227

## 2021-11-17 ENCOUNTER — Emergency Department
Admission: EM | Admit: 2021-11-17 | Discharge: 2021-11-18 | Disposition: A | Discharge status: Home / Self Care | Payer: Medicare Other | Attending: Emergency Medicine | Admitting: Emergency Medicine

## 2021-11-17 ENCOUNTER — Encounter: Payer: Self-pay | Admitting: Emergency Medicine

## 2021-11-17 ENCOUNTER — Other Ambulatory Visit: Payer: Self-pay

## 2021-11-17 DIAGNOSIS — G40909 Epilepsy, unspecified, not intractable, without status epilepticus: Secondary | ICD-10-CM | POA: Insufficient documentation

## 2021-11-17 DIAGNOSIS — M25511 Pain in right shoulder: Secondary | ICD-10-CM | POA: Diagnosis not present

## 2021-11-17 DIAGNOSIS — W19XXXA Unspecified fall, initial encounter: Secondary | ICD-10-CM | POA: Insufficient documentation

## 2021-11-17 DIAGNOSIS — R569 Unspecified convulsions: Secondary | ICD-10-CM | POA: Diagnosis present

## 2021-11-17 LAB — CBG MONITORING, ED: Glucose-Capillary: 95 mg/dL (ref 70–99)

## 2021-11-17 MED ORDER — SODIUM CHLORIDE 0.9 % IV SOLN
1000.0000 mg | Freq: Once | INTRAVENOUS | Status: AC
  Administered 2021-11-18: 1000 mg via INTRAVENOUS
  Filled 2021-11-17: qty 20

## 2021-11-17 MED ORDER — LORAZEPAM 2 MG/ML IJ SOLN: 2.0000 mg | Freq: Once | INTRAMUSCULAR | Status: DC

## 2021-11-17 NOTE — ED Notes (Signed)
Dr. Joni Fears at bedside assessing pt.

## 2021-11-17 NOTE — ED Notes (Signed)
Pt had jerking of all body, stiff,foaming out of her mouth, airway protected.  brought pt to room 53. Pt was combative for a few minutes and at present she is alert, informed pt of what happened. 96%, 151/97, 96

## 2021-11-17 NOTE — ED Triage Notes (Signed)
Pt presents to ER from home with complaints of right shoulder pain reports unable to lift her arm. Pt reports she has a history of seizures and ran out of her seizure medication for about a week. Pt reports she had a seizure yesterday and another one this evening. Pt reports she developed right shoulder pain yesterday not sure if she hit her right shoulder when she had a seizure. Pt denies taking any blood thinner pt does not remember if she hit her head or not

## 2021-11-18 ENCOUNTER — Emergency Department: Payer: Medicare Other

## 2021-11-18 DIAGNOSIS — G40909 Epilepsy, unspecified, not intractable, without status epilepticus: Secondary | ICD-10-CM | POA: Diagnosis not present

## 2021-11-18 LAB — COMPREHENSIVE METABOLIC PANEL
ALT: 43 U/L (ref 0–44)
AST: 85 U/L — ABNORMAL HIGH (ref 15–41)
Albumin: 4.5 g/dL (ref 3.5–5.0)
Alkaline Phosphatase: 92 U/L (ref 38–126)
Anion gap: 10 (ref 5–15)
BUN: 9 mg/dL (ref 6–20)
CO2: 20 mmol/L — ABNORMAL LOW (ref 22–32)
Calcium: 9.4 mg/dL (ref 8.9–10.3)
Chloride: 107 mmol/L (ref 98–111)
Creatinine, Ser: 0.61 mg/dL (ref 0.44–1.00)
GFR, Estimated: >60 mL/min (ref >60)
Glucose, Bld: 88 mg/dL (ref 70–99)
Potassium: 3.9 mmol/L (ref 3.5–5.1)
Sodium: 137 mmol/L (ref 135–145)
Total Bilirubin: 0.8 mg/dL (ref 0.3–1.2)
Total Protein: 8.2 g/dL — ABNORMAL HIGH (ref 6.5–8.1)

## 2021-11-18 LAB — CBC
HCT: 38.1 % (ref 36.0–46.0)
Hemoglobin: 13 g/dL (ref 12.0–15.0)
MCH: 31 pg (ref 26.0–34.0)
MCHC: 34.1 g/dL (ref 30.0–36.0)
MCV: 90.7 fL (ref 80.0–100.0)
Platelets: 169 10*3/uL (ref 150–400)
RBC: 4.2 MIL/uL (ref 3.87–5.11)
RDW: 13.2 % (ref 11.5–15.5)
WBC: 6.6 10*3/uL (ref 4.0–10.5)
nRBC: 0 % (ref 0.0–0.2)

## 2021-11-18 LAB — VALPROIC ACID LEVEL: Valproic Acid Lvl: <10 ug/mL — ABNORMAL LOW (ref 50.0–100.0)

## 2021-11-18 LAB — PHENYTOIN LEVEL, TOTAL: Phenytoin Lvl: <2.5 ug/mL — ABNORMAL LOW (ref 10.0–20.0)

## 2021-11-18 LAB — CBG MONITORING, ED: Glucose-Capillary: 89 mg/dL (ref 70–99)

## 2021-11-18 MED ORDER — LORAZEPAM 2 MG/ML IJ SOLN: 1.0000 mg | Freq: Once | INTRAMUSCULAR | Status: AC

## 2021-11-18 MED ORDER — DIVALPROEX SODIUM 500 MG PO DR TAB
1000.0000 mg | DELAYED_RELEASE_TABLET | ORAL | Status: AC
  Administered 2021-11-18: 1000 mg via ORAL
  Filled 2021-11-18: qty 2

## 2021-11-18 MED ORDER — LORAZEPAM 2 MG/ML IJ SOLN
INTRAMUSCULAR | Status: AC
  Administered 2021-11-18: 1 mg via INTRAVENOUS
  Filled 2021-11-18: qty 1

## 2021-11-18 MED ORDER — SODIUM CHLORIDE 0.9 % IV BOLUS
1000.0000 mL | Freq: Once | INTRAVENOUS | Status: AC
  Administered 2021-11-18: 1000 mL via INTRAVENOUS

## 2021-11-18 MED ORDER — DIVALPROEX SODIUM 500 MG PO DR TAB: DELAYED_RELEASE_TABLET | ORAL | 1 refills | Status: DC

## 2021-11-18 MED ORDER — PHENYTOIN SODIUM EXTENDED 100 MG PO CAPS: 200.0000 mg | ORAL_CAPSULE | Freq: Every day | ORAL | 1 refills | Status: DC

## 2021-11-18 NOTE — ED Provider Notes (Signed)
[facility] Provider Note    Event Date/Time   First MD Initiated Contact with Patient 11/17/21 2359     (approximate)   History   Chief Complaint: Seizures   HPI  [name] is a 56 y.o. female  with a h/o seizures, schizophrenia, cocaine abuse who comes to the ED due to seizures at home. She reports she ran out of her AEDs 1 week ago.  After having a seizure at home, she also developed R shoulder pain which she attributes to fall.  No chest pain, sob, fever, vision change, or head trauma. while in triage, pt was noted to have seizure and brought immediately to treatment room.      Physical Exam   Triage Vital Signs: ED Triage Vitals [11/17/21 2345]  Enc Vitals Group     BP (!) 131/92     Pulse Rate (!) 104     Resp 16     Temp 98.7 F (37.1 C)     Temp Source Oral     SpO2 100 %     Weight 101 lb (45.8 kg)     Height 4\' 9"  (1.448 m)     Head Circumference      Peak Flow      Pain Score 8     Pain Loc      Pain Edu?      Excl. in GC?     Most recent vital signs: Vitals:   11/17/21 2345 11/18/21 0315  BP: (!) 131/92 99/74  Pulse: (!) 104 88  Resp: 16 17  Temp: 98.7 F (37.1 C)   SpO2: 100% 94%    General: Post-ictal CV:  Good peripheral perfusion. RRR Resp:  Normal effort. ctab Abd:  No distention. Soft nontender Other:  No deformity or wound. FROM. No head trauma. No tongue bite - pt does not have any teeth.   ED Results / Procedures / Treatments   Labs (all labs ordered are listed, but only abnormal results are displayed) Labs Reviewed  PHENYTOIN LEVEL, TOTAL - Abnormal; Notable for the following components:      Result Value   Phenytoin Lvl <2.5 (*)    All other components within normal limits  COMPREHENSIVE METABOLIC PANEL - Abnormal; Notable for the following components:   CO2 20 (*)    Total Protein 8.2 (*)    AST 85 (*)    All other components within normal limits  VALPROIC ACID LEVEL - Abnormal; Notable  for the following components:   Valproic Acid Lvl <10 (*)    All other components within normal limits  CBC  CBG MONITORING, ED  CBG MONITORING, ED     EKG Interpreted by me Sinus tachy, rate 101. Normal axis, normal intervals. PRWP. Normal ST segments and T waves   RADIOLOGY Xray right shoulder interpreted by me, negative for fracture or dislocation. Radiology report reviewed   PROCEDURES:  .Critical Care  Performed by: 11/20/21, MD Authorized by: Sharman Cheek, MD   Critical care provider statement:    Critical care time (minutes):  35   Critical care time was exclusive of:  Separately billable procedures and treating other patients   Critical care was necessary to treat or prevent imminent or life-threatening deterioration of the following conditions:  CNS failure or compromise   Critical care was time spent personally by me on the following activities:  Development of treatment plan with patient or surrogate, discussions with consultants, evaluation of patient's  response to treatment, examination of patient, obtaining history from patient or surrogate, ordering and performing treatments and interventions, ordering and review of laboratory studies, ordering and review of radiographic studies, pulse oximetry, re-evaluation of patient's condition and review of old charts    MEDICATIONS ORDERED IN ED: Medications  phenytoin (DILANTIN) 1,000 mg in sodium chloride 0.9 % 250 mL IVPB (0 mg Intravenous Stopped 11/18/21 0257)  LORazepam (ATIVAN) injection 1 mg (1 mg Intravenous Given 11/18/21 0035)  sodium chloride 0.9 % bolus 1,000 mL (0 mLs Intravenous Stopped 11/18/21 0331)  divalproex (DEPAKOTE) DR tablet 1,000 mg (1,000 mg Oral Given 11/18/21 0348)     IMPRESSION / MDM / [place] / ED COURSE  I reviewed the triage vital signs and the nursing notes.                              Differential diagnosis includes, but is not limited to, seizure due to  medication non compliance, electrolyte abnormality, AKI, shoulder fracture  Patient's presentation is most consistent with acute presentation with potential threat to life or bodily function.  Pt p/w seizures in setting of being off meds for 1 week.  She was given IV ativan to stop seizure activity in the ED.  She then recovered to normal mental status. She was given a phenytoin load as well as oral depakote. I have sent refills to her pharmacy. Labs are unremarkable and there are no acute injuries.  She is conversant, oriented, ambulatory, stable for discharge.       FINAL CLINICAL IMPRESSION(S) / ED DIAGNOSES   Final diagnoses:  Seizure ([place])     Rx / DC Orders   ED Discharge Orders          Ordered    divalproex (DEPAKOTE) 500 MG DR tablet        11/18/21 0405    phenytoin (DILANTIN) 100 MG ER capsule  Daily at bedtime        11/18/21 0405             Note:  This document was prepared using Dragon voice recognition software and may include unintentional dictation errors.   Carrie Mew, MD 11/18/21 [phone]

## 2021-12-08 ENCOUNTER — Emergency Department
Admission: EM | Admit: 2021-12-08 | Discharge: 2021-12-09 | Disposition: A | Discharge status: Home / Self Care | Payer: Medicare Other | Attending: Emergency Medicine | Admitting: Emergency Medicine

## 2021-12-08 ENCOUNTER — Other Ambulatory Visit: Payer: Self-pay

## 2021-12-08 DIAGNOSIS — Z91148 Patient's other noncompliance with medication regimen for other reason: Secondary | ICD-10-CM | POA: Insufficient documentation

## 2021-12-08 DIAGNOSIS — G8929 Other chronic pain: Secondary | ICD-10-CM | POA: Insufficient documentation

## 2021-12-08 DIAGNOSIS — M25511 Pain in right shoulder: Secondary | ICD-10-CM | POA: Insufficient documentation

## 2021-12-08 DIAGNOSIS — R569 Unspecified convulsions: Secondary | ICD-10-CM

## 2021-12-08 DIAGNOSIS — G40909 Epilepsy, unspecified, not intractable, without status epilepticus: Secondary | ICD-10-CM | POA: Insufficient documentation

## 2021-12-08 LAB — COMPREHENSIVE METABOLIC PANEL
ALT: 23 U/L (ref 0–44)
AST: 36 U/L (ref 15–41)
Albumin: 4.2 g/dL (ref 3.5–5.0)
Alkaline Phosphatase: 82 U/L (ref 38–126)
Anion gap: 10 (ref 5–15)
BUN: 7 mg/dL (ref 6–20)
CO2: 21 mmol/L — ABNORMAL LOW (ref 22–32)
Calcium: 9.6 mg/dL (ref 8.9–10.3)
Chloride: 108 mmol/L (ref 98–111)
Creatinine, Ser: 0.52 mg/dL (ref 0.44–1.00)
GFR, Estimated: >60 mL/min (ref >60)
Glucose, Bld: 91 mg/dL (ref 70–99)
Potassium: 3.3 mmol/L — ABNORMAL LOW (ref 3.5–5.1)
Sodium: 139 mmol/L (ref 135–145)
Total Bilirubin: 0.3 mg/dL (ref 0.3–1.2)
Total Protein: 7.7 g/dL (ref 6.5–8.1)

## 2021-12-08 LAB — CBC
HCT: 34.6 % — ABNORMAL LOW (ref 36.0–46.0)
Hemoglobin: 11.7 g/dL — ABNORMAL LOW (ref 12.0–15.0)
MCH: 31 pg (ref 26.0–34.0)
MCHC: 33.8 g/dL (ref 30.0–36.0)
MCV: 91.8 fL (ref 80.0–100.0)
Platelets: 233 10*3/uL (ref 150–400)
RBC: 3.77 MIL/uL — ABNORMAL LOW (ref 3.87–5.11)
RDW: 13.3 % (ref 11.5–15.5)
WBC: 7.5 10*3/uL (ref 4.0–10.5)
nRBC: 0 % (ref 0.0–0.2)

## 2021-12-08 LAB — VALPROIC ACID LEVEL: Valproic Acid Lvl: <10 ug/mL — ABNORMAL LOW (ref 50.0–100.0)

## 2021-12-08 LAB — PHENYTOIN LEVEL, TOTAL: Phenytoin Lvl: <2.5 ug/mL — ABNORMAL LOW (ref 10.0–20.0)

## 2021-12-08 MED ORDER — QUETIAPINE FUMARATE 100 MG PO TABS: 100.0000 mg | ORAL_TABLET | Freq: Every day | ORAL | 0 refills | Status: DC

## 2021-12-08 MED ORDER — PHENYTOIN SODIUM EXTENDED 100 MG PO CAPS: 200.0000 mg | ORAL_CAPSULE | Freq: Every day | ORAL | 0 refills | Status: DC

## 2021-12-08 MED ORDER — ACETAMINOPHEN 325 MG PO TABS
650.0000 mg | ORAL_TABLET | Freq: Once | ORAL | Status: AC
  Administered 2021-12-08: 650 mg via ORAL
  Filled 2021-12-08: qty 2

## 2021-12-08 MED ORDER — PHENYTOIN SODIUM EXTENDED 100 MG PO CAPS
200.0000 mg | ORAL_CAPSULE | Freq: Once | ORAL | Status: AC
  Administered 2021-12-08: 200 mg via ORAL
  Filled 2021-12-08: qty 2

## 2021-12-08 MED ORDER — BUSPIRONE HCL 7.5 MG PO TABS: 7.5000 mg | ORAL_TABLET | Freq: Two times a day (BID) | ORAL | 0 refills | Status: DC

## 2021-12-08 MED ORDER — DIVALPROEX SODIUM 500 MG PO DR TAB: 500.0000 mg | DELAYED_RELEASE_TABLET | Freq: Three times a day (TID) | ORAL | 0 refills | Status: DC

## 2021-12-08 MED ORDER — PHENYTOIN SODIUM EXTENDED 100 MG PO CAPS: 100.0000 mg | ORAL_CAPSULE | Freq: Once | ORAL | Status: DC

## 2021-12-08 MED ORDER — BUSPIRONE HCL 5 MG PO TABS
7.5000 mg | ORAL_TABLET | Freq: Once | ORAL | Status: AC
  Administered 2021-12-08: 7.5 mg via ORAL
  Filled 2021-12-08: qty 2

## 2021-12-08 MED ORDER — DIVALPROEX SODIUM 500 MG PO DR TAB
500.0000 mg | DELAYED_RELEASE_TABLET | Freq: Once | ORAL | Status: AC
  Administered 2021-12-08: 500 mg via ORAL
  Filled 2021-12-08: qty 1

## 2021-12-08 MED ORDER — QUETIAPINE FUMARATE 25 MG PO TABS
100.0000 mg | ORAL_TABLET | Freq: Once | ORAL | Status: AC
  Administered 2021-12-08: 100 mg via ORAL
  Filled 2021-12-08: qty 4

## 2021-12-08 NOTE — ED Provider Notes (Signed)
[facility] Provider Note   Event Date/Time   First MD Initiated Contact with Patient 12/08/21 2301     (approximate) History  Seizures  HPI [name] is a 56 y.o. female with a stated past medical history of epilepsy who presents for breakthrough seizure activity today after missing the doses of her normal antiepileptic medication.  Patient states that she was at work and forgot to go to the pharmacy beforehand to get her antiepileptic medications and when she got off work the pharmacy was closed.  Patient states that she will not be able to get her medications until Monday we will be able to pick up a [facility] day supply [facility].  Patient also complaining of 3 weeks of anterior right shoulder pain that is keeping her from using this shoulder to the full range of motion without significant pain.  Patient denies any trauma to this shoulder and states that she has been wearing a sling but has not been unable to follow-up with the orthopedist that she was referred to after her last visit. ROS: Patient currently denies any vision changes, tinnitus, difficulty speaking, facial droop, sore throat, chest pain, shortness of breath, abdominal pain, nausea/vomiting/diarrhea, dysuria, or weakness/numbness/paresthesias in any extremity   Physical Exam  Triage Vital Signs: ED Triage Vitals [12/08/21 2054]  Enc Vitals Group     BP 135/89     Pulse Rate (!) 102     Resp 18     Temp 97.7 F (36.5 C)     Temp Source Oral     SpO2 100 %     Weight      Height      Head Circumference      Peak Flow      Pain Score      Pain Loc      Pain Edu?      Excl. in GC?    Most recent vital signs: Vitals:   12/08/21 2320 12/08/21 2345  BP: 132/85 123/75  Pulse: 100 77  Resp: 17 17  Temp:    SpO2: 99% 99%   General: Awake, oriented x4. CV:  Good peripheral perfusion.  Resp:  Normal effort.  Abd:  No distention.  Other:  Middle-aged African-American female laying in  bed in no acute distress.  Right arm with limited range of motion in extension at the right shoulder.  Passive range of motion intact with moderate pain.  No pronator drift ED Results / Procedures / Treatments  Labs (all labs ordered are listed, but only abnormal results are displayed) Labs Reviewed  PHENYTOIN LEVEL, TOTAL - Abnormal; Notable for the following components:      Result Value   Phenytoin Lvl <2.5 (*)    All other components within normal limits  VALPROIC ACID LEVEL - Abnormal; Notable for the following components:   Valproic Acid Lvl <10 (*)    All other components within normal limits  COMPREHENSIVE METABOLIC PANEL - Abnormal; Notable for the following components:   Potassium 3.3 (*)    CO2 21 (*)    All other components within normal limits  CBC - Abnormal; Notable for the following components:   RBC 3.77 (*)    Hemoglobin 11.7 (*)    HCT 34.6 (*)    All other components within normal limits   PROCEDURES: Critical Care performed: No .1-3 Lead EKG Interpretation  Performed by: Merwyn Katos, MD Authorized by: Merwyn Katos, MD  Interpretation: normal     ECG rate:  78   ECG rate assessment: normal     Rhythm: sinus rhythm     Ectopy: none     Conduction: normal    MEDICATIONS ORDERED IN ED: Medications  busPIRone (BUSPAR) tablet 7.5 mg (7.5 mg Oral Given 12/08/21 2339)  divalproex (DEPAKOTE) DR tablet 500 mg (500 mg Oral Given 12/08/21 2337)  QUEtiapine (SEROQUEL) tablet 100 mg (100 mg Oral Given 12/08/21 2338)  phenytoin (DILANTIN) ER capsule 200 mg (200 mg Oral Given 12/08/21 2337)  acetaminophen (TYLENOL) tablet 650 mg (650 mg Oral Given 12/08/21 2339)   IMPRESSION / MDM / ASSESSMENT AND PLAN / ED COURSE  I reviewed the triage vital signs and the nursing notes.                             Patient's presentation is most consistent with acute presentation with potential threat to life or bodily function. Patient presents after recent seizure  episode.  Patient had slow return to baseline mental and physical function per patient's boyfriend at home. No immunosuppresion hx and had no preceding fever. No history of alcohol abuse or suspicion for toxin ingestion. Unlikely stroke, syncope. Unlikely infectious etiology. No preceding trauma.  Workup: EKG, BMP, POCT glucose (pregnancy test if female), phenytoin level, valproic acid level Laboratory evaluation significant for a valproic acid level less than 10 and phenytoin level less than 2.5.  Patient has stable anemia with a hemoglobin of 11.7 and hematocrit of 34.6  Rx: 1 day of [facility]'s [facility].  Patient also given referral to orthopedic surgery for her chronic right shoulder pain Disposition: Discharge home with primary care follow up in next 24-48 hours.   FINAL CLINICAL IMPRESSION(S) / ED DIAGNOSES   Final diagnoses:  Seizure ([place])  Nonadherence to medication  Chronic right shoulder pain   Rx / DC Orders   ED Discharge Orders          Ordered    AMB referral to orthopedics        12/08/21 2325    busPIRone (BUSPAR) 7.5 MG tablet  2 times daily        12/08/21 2332    divalproex (DEPAKOTE) 500 MG DR tablet  3 times daily        12/08/21 2332    phenytoin (DILANTIN) 100 MG ER capsule  Daily at bedtime        12/08/21 2332    QUEtiapine (SEROQUEL) 100 MG tablet  Daily at bedtime        12/08/21 2332           Note:  This document was prepared using Dragon voice recognition software and may include unintentional dictation errors.   Naaman Plummer, MD 12/09/21 [phone]

## 2021-12-08 NOTE — ED Triage Notes (Signed)
Pt states she had a seizure tonight, unwitnessed, states her boyfriend was in another room.  Pt states she is having pain in her right arm/shoulder and is unsure if she injured it.  Was here and seen for similar arm pain on 10/14 and states she was placed in a sling.  Pt reports she has taken her meds as prescribed but has had trouble getting it for this month.

## 2021-12-26 ENCOUNTER — Ambulatory Visit: Payer: Medicare Other | Admitting: Orthopedic Surgery

## 2022-04-12 ENCOUNTER — Ambulatory Visit: Payer: 59 | Admitting: Orthopedic Surgery

## 2022-05-14 ENCOUNTER — Emergency Department
Admission: EM | Admit: 2022-05-14 | Discharge: 2022-05-14 | Disposition: A | Discharge status: Home / Self Care | Payer: 59 | Attending: Emergency Medicine | Admitting: Emergency Medicine

## 2022-05-14 ENCOUNTER — Other Ambulatory Visit: Payer: Self-pay

## 2022-05-14 DIAGNOSIS — K644 Residual hemorrhoidal skin tags: Secondary | ICD-10-CM | POA: Insufficient documentation

## 2022-05-14 DIAGNOSIS — K649 Unspecified hemorrhoids: Secondary | ICD-10-CM

## 2022-05-14 DIAGNOSIS — Z760 Encounter for issue of repeat prescription: Secondary | ICD-10-CM | POA: Diagnosis not present

## 2022-05-14 LAB — URINALYSIS, ROUTINE W REFLEX MICROSCOPIC
Bilirubin Urine: NEGATIVE
Glucose, UA: NEGATIVE mg/dL
Hgb urine dipstick: NEGATIVE
Ketones, ur: NEGATIVE mg/dL
Leukocytes,Ua: NEGATIVE
Nitrite: NEGATIVE
Protein, ur: NEGATIVE mg/dL
Specific Gravity, Urine: 1.004 — ABNORMAL LOW (ref 1.005–1.030)
pH: 5 (ref 5.0–8.0)

## 2022-05-14 MED ORDER — BENZOCAINE 20 % RE OINT: TOPICAL_OINTMENT | Freq: Four times a day (QID) | RECTAL | 0 refills | Status: DC | PRN

## 2022-05-14 MED ORDER — DIVALPROEX SODIUM 500 MG PO DR TAB: DELAYED_RELEASE_TABLET | ORAL | 1 refills | Status: DC

## 2022-05-14 MED ORDER — PHENYTOIN SODIUM EXTENDED 100 MG PO CAPS: 200.0000 mg | ORAL_CAPSULE | Freq: Every day | ORAL | 1 refills | Status: DC

## 2022-05-14 NOTE — ED Provider Notes (Signed)
[facility] Provider Note    Event Date/Time   First MD Initiated Contact with Patient 05/14/22 1040     (approximate)   History   Hemorrhoids and Medication Refill   HPI  [name] is a 57 y.o. female presents to the ED with complaint of urinary symptoms along with history of hemorrhoids.  Patient has been prescribed over-the-counter medication for hemorrhoids and states it is not helping.  Patient also wants to get her seizure medication refilled as she thinks that she cannot see her doctor until her primary care refers her and gets her appointment.     Physical Exam   Triage Vital Signs: ED Triage Vitals  Enc Vitals Group     BP 05/14/22 0958 131/88     Pulse Rate 05/14/22 0958 (!) 101     Resp 05/14/22 0958 16     Temp 05/14/22 0958 99.3 F (37.4 C)     Temp Source 05/14/22 0958 Oral     SpO2 05/14/22 0958 97 %     Weight 05/14/22 0957 100 lb 15.5 oz (45.8 kg)     Height 05/14/22 0957 4\' 9"  (1.448 m)     Head Circumference --      Peak Flow --      Pain Score 05/14/22 0957 8     Pain Loc --      Pain Edu? --      Excl. in GC? --     Most recent vital signs: Vitals:   05/14/22 0958  BP: 131/88  Pulse: (!) 101  Resp: 16  Temp: 99.3 F (37.4 C)  SpO2: 97%     General: Awake, no distress.  CV:  Good peripheral perfusion.  Resp:  Normal effort.   Abd:  No distention.  Other:  Rectal exam with a single external hemorrhoid, nonthrombosed.   ED Results / Procedures / Treatments   Labs (all labs ordered are listed, but only abnormal results are displayed) Labs Reviewed  URINALYSIS, ROUTINE W REFLEX MICROSCOPIC - Abnormal; Notable for the following components:      Result Value   Color, Urine STRAW (*)    APPearance CLEAR (*)    Specific Gravity, Urine 1.004 (*)    All other components within normal limits       PROCEDURES:  Critical Care performed:   Procedures   MEDICATIONS ORDERED IN ED: Medications - No  data to display   IMPRESSION / MDM / ASSESSMENT AND PLAN / ED COURSE  I reviewed the triage vital signs and the nursing notes.   Differential diagnosis includes, but is not limited to, external hemorrhoid, thrombosed hemorrhoid, rectal itching, dysuria, encounter for medication refill, patient noncompliant.  57 year old female presents to the ED for refill of her seizure medication and also with complaint of hemorrhoids.  On exam there is a nonthrombosed hemorrhoid present and appreciation for benzocaine rectal cream was prescribed.  Refills on her medications was sent to her pharmacy and I also spoke with the pharmacist who states that she is noncompliant with her medications but she currently is supposed to be taking Depakote and Dilantin.  Patient was strongly advised to follow-up with her primary care provider and also obtain an appointment with her doctor for refills on her seizure medication.      Patient's presentation is most consistent with acute complicated illness / injury requiring diagnostic workup.  FINAL CLINICAL IMPRESSION(S) / ED DIAGNOSES   Final diagnoses:  Acute hemorrhoid  Encounter  for medication refill     Rx / DC Orders   ED Discharge Orders          Ordered    divalproex (DEPAKOTE) 500 MG DR tablet        05/14/22 1130    phenytoin (DILANTIN) 100 MG ER capsule  Daily at bedtime        05/14/22 1130    benzocaine (AMERICAINE) 20 % rectal ointment  Every 6 hours PRN        05/14/22 1155             Note:  This document was prepared using Dragon voice recognition software and may include unintentional dictation errors.   Tommi Rumps, PA-C 05/14/22 1229    Jene Every, MD 05/14/22 [phone]

## 2022-05-14 NOTE — ED Triage Notes (Signed)
Pt here with hemorrhoids for months. Pt also here for a refill of her seizure medication, Depakote and Dilantin. Pt states she also has burning when urinating. Pt ambulatory to triage.

## 2022-05-14 NOTE — Discharge Instructions (Signed)
Call make an appoint with your primary care provider for continued medications.  Also prescription for your hemorrhoid was sent to the pharmacy and if not improving your primary care provider can make arrangements for you to see a specialist.  Read the information about sitz bath's and begin doing warm bath soaks to help with your hemorrhoids along with the cream.  Would constipation with taking a stool softener if needed.  Continue with your seizure medication as prescribed.

## 2022-09-01 ENCOUNTER — Emergency Department
Admission: EM | Admit: 2022-09-01 | Discharge: 2022-09-01 | Discharge status: Against Medical Advice | Payer: Medicare HMO | Source: Home / Self Care

## 2022-09-01 ENCOUNTER — Other Ambulatory Visit: Payer: Self-pay

## 2022-09-01 DIAGNOSIS — R569 Unspecified convulsions: Secondary | ICD-10-CM | POA: Insufficient documentation

## 2022-09-01 DIAGNOSIS — Z5321 Procedure and treatment not carried out due to patient leaving prior to being seen by health care provider: Secondary | ICD-10-CM | POA: Insufficient documentation

## 2022-09-01 NOTE — ED Notes (Signed)
Pt is refusing vitals. Refusing medical care and treatment. Pt requesting to call her boyfriend to pick her up. Pt in wheel chair and taken to phone.

## 2022-09-01 NOTE — ED Notes (Signed)
Patient observed walking out of lobby outside with steady gait.

## 2022-09-01 NOTE — ED Notes (Signed)
Patient states she got in touch with her boyfriend and he will be coming to pick her up.  Patient out of wheelchair and ambulatory to rest room with steady gait.

## 2022-09-01 NOTE — ED Triage Notes (Addendum)
Pt to ed from home via [facility] for psuedo seizures. Ems states when they arrived at her house she was "fighting with the air yelling I am having a seizure I am having a seizure". Vitals for EMS WNL. Pt was in triage middle. Pt has been drinking tonight.   Pt is refusing triage. Pt is crying and yelling "I aint letting you do that. Dont touch me. Your white and I'm black and this aint fair".  Pt doesn't have on any underwear and will not stay covered up. Pt demanding to get up and leave. Pt is refusing to put on hospital provided underwear.   Charge made aware.

## 2022-10-18 ENCOUNTER — Emergency Department
Admission: EM | Admit: 2022-10-18 | Discharge: 2022-10-18 | Disposition: A | Discharge status: Home / Self Care | Payer: Medicare HMO | Attending: Emergency Medicine | Admitting: Emergency Medicine

## 2022-10-18 ENCOUNTER — Emergency Department: Payer: Medicare HMO

## 2022-10-18 ENCOUNTER — Other Ambulatory Visit: Payer: Self-pay

## 2022-10-18 DIAGNOSIS — S90121A Contusion of right lesser toe(s) without damage to nail, initial encounter: Secondary | ICD-10-CM | POA: Insufficient documentation

## 2022-10-18 DIAGNOSIS — W228XXA Striking against or struck by other objects, initial encounter: Secondary | ICD-10-CM | POA: Insufficient documentation

## 2022-10-18 DIAGNOSIS — S99921A Unspecified injury of right foot, initial encounter: Secondary | ICD-10-CM | POA: Diagnosis present

## 2022-10-18 NOTE — ED Triage Notes (Signed)
Pt to ED via EMS from home, pt c/o right 4th toe pain after hitting it on her bed frame. Pt states she had 3 beers tonight

## 2022-10-18 NOTE — ED Provider Notes (Signed)
   [facility] Provider Note    Event Date/Time   First MD Initiated Contact with Patient 10/18/22 [phone]     (approximate)   History   Toe Pain   HPI  [name] is a 57 y.o. female with a history of schizophrenia, cocaine abuse, seizure disorder who comes ED complaining of pain in the right fourth toe after hitting it on her bed frame.  Denies any other injury.  Does report drinking alcohol tonight.     Physical Exam   Triage Vital Signs: ED Triage Vitals  Encounter Vitals Group     BP 10/18/22 0230 121/78     Systolic BP Percentile --      Diastolic BP Percentile --      Pulse Rate 10/18/22 0230 (!) 109     Resp 10/18/22 0230 18     Temp 10/18/22 0230 98.9 F (37.2 C)     Temp Source 10/18/22 0230 Oral     SpO2 10/18/22 0230 96 %     Weight 10/18/22 0229 98 lb (44.5 kg)     Height 10/18/22 0229 5' (1.524 m)     Head Circumference --      Peak Flow --      Pain Score 10/18/22 0229 5     Pain Loc --      Pain Education --      Exclude from Growth Chart --     Most recent vital signs: Vitals:   10/18/22 0230  BP: 121/78  Pulse: (!) 109  Resp: 18  Temp: 98.9 F (37.2 C)  SpO2: 96%    General: Awake, no distress.  CV:  Good peripheral perfusion.  Normal DP pulse and capillary refill. Other:  Tenderness and swelling of the right fourth toe.  No deformity or open wounds.   ED Results / Procedures / Treatments   Labs (all labs ordered are listed, but only abnormal results are displayed) Labs Reviewed - No data to display   RADIOLOGY X-ray right foot interpreted by me, negative for fracture.  Radiology report reviewed   PROCEDURES:  Procedures   MEDICATIONS ORDERED IN ED: Medications - No data to display   IMPRESSION / MDM / ASSESSMENT AND PLAN / ED COURSE  I reviewed the triage vital signs and the nursing notes.                             Patient presents with minor blunt trauma injury to the right fourth toe.   X-ray negative for fracture.  Will provide a postop shoe for comfort.  Counseled patient to avoid drinking alcohol due to her seizure disorder.  She is otherwise in her usual state of health and stable for discharge.      FINAL CLINICAL IMPRESSION(S) / ED DIAGNOSES   Final diagnoses:  Contusion of lesser toe of right foot without damage to nail, initial encounter     Rx / DC Orders   ED Discharge Orders     None        Note:  This document was prepared using Dragon voice recognition software and may include unintentional dictation errors.   Sharman Cheek, MD 10/18/22 [phone]

## 2023-03-18 ENCOUNTER — Emergency Department
Admission: EM | Admit: 2023-03-18 | Discharge: 2023-03-19 | Disposition: A | Discharge status: Home / Self Care | Payer: Medicaid Other | Attending: Emergency Medicine | Admitting: Emergency Medicine

## 2023-03-18 DIAGNOSIS — R079 Chest pain, unspecified: Secondary | ICD-10-CM | POA: Diagnosis not present

## 2023-03-18 DIAGNOSIS — R0602 Shortness of breath: Secondary | ICD-10-CM | POA: Insufficient documentation

## 2023-03-18 DIAGNOSIS — J45909 Unspecified asthma, uncomplicated: Secondary | ICD-10-CM | POA: Diagnosis not present

## 2023-03-18 DIAGNOSIS — R569 Unspecified convulsions: Secondary | ICD-10-CM | POA: Insufficient documentation

## 2023-03-18 NOTE — ED Triage Notes (Signed)
Per patient, she called EMS for shortness of breath and mid chest pain, but on EMS arrival the patient was having seizure like activity. EMS gave 2.5mg  of Versed IM in route. Patient is A&Ox4 on arrival with slurred speech.

## 2023-03-19 ENCOUNTER — Emergency Department: Payer: Medicare HMO

## 2023-03-19 DIAGNOSIS — R0602 Shortness of breath: Secondary | ICD-10-CM | POA: Diagnosis not present

## 2023-03-19 NOTE — ED Notes (Signed)
Pt is refusing treatment at this time. I spoke to pt regarding need for blood and pt refused.   It is noted by primary RN Rayfield Citizen that pt removed IV in the process of being started and threw it across the room.   Pt yelling and crying at time of conversation. Verbal de-escalation unsuccessful as pt is continuing to refuse treatment at this time.   Pt states she does not have a ride home and cab is not running. AMA paperwork signed and pt independently ambulated to ED lobby

## 2023-03-19 NOTE — ED Provider Notes (Signed)
[facility] Provider Note    Event Date/Time   First MD Initiated Contact with Patient 03/18/23 2326     (approximate)   History   Shortness of Breath, Chest Pain, and Seizures   HPI [name] is a 58 y.o. female with history of chronic pain syndrome, asthma, bipolar, schizophrenia, seizures presenting today for shortness of breath.  Patient states while at home earlier today she had an episode of shortness of breath.  She called her friend over who called EMS.  Reportedly had an episode that seemed like seizure like activity and was given Versed by EMS.  Patient states that she did not have a seizure at any point.  No reported postictal period.  On arrival she is stable and alert.  She states she did drink alcohol tonight and her speech is normal for her.  She currently is denying shortness of breath, chest pain, abdominal pain, nausea, vomiting.  She only wants treatment for her shortness of breath and is not concerned about her seizure like activity.     Physical Exam   Triage Vital Signs: ED Triage Vitals  Encounter Vitals Group     BP 03/18/23 2334 99/76     Systolic BP Percentile --      Diastolic BP Percentile --      Pulse Rate 03/18/23 2334 99     Resp 03/18/23 2334 20     Temp 03/18/23 2334 98.3 F (36.8 C)     Temp Source 03/18/23 2334 Oral     SpO2 03/18/23 2334 99 %     Weight --      Height --      Head Circumference --      Peak Flow --      Pain Score 03/18/23 2335 4     Pain Loc --      Pain Education --      Exclude from Growth Chart --     Most recent vital signs: Vitals:   03/18/23 2334 03/19/23 0000  BP: 99/76 100/68  Pulse: 99   Resp: 20 17  Temp: 98.3 F (36.8 C)   SpO2: 99%    Physical Exam: I have reviewed the vital signs and nursing notes. General: Awake, alert, no acute distress.  Nontoxic appearing. Head:  Atraumatic, normocephalic.   ENT:  EOM intact, PERRL. Oral mucosa is pink and moist with no  lesions. Neck: Neck is supple with full range of motion, No meningeal signs. Cardiovascular:  RRR, No murmurs. Peripheral pulses palpable and equal bilaterally. Respiratory:  Symmetrical chest wall expansion.  No rhonchi, rales, or wheezes.  Good air movement throughout.  No use of accessory muscles.   Musculoskeletal:  No cyanosis or edema. Moving extremities with full ROM Abdomen:  Soft, nontender, nondistended. Neuro:  GCS 15, moving all four extremities, interacting appropriately. Speech clear. Psych:  Calm, appropriate.   Skin:  Warm, dry, no rash.    ED Results / Procedures / Treatments   Labs (all labs ordered are listed, but only abnormal results are displayed) Labs Reviewed  CBC WITH DIFFERENTIAL/PLATELET  BASIC METABOLIC PANEL  ETHANOL  TROPONIN I (HIGH SENSITIVITY)     EKG My EKG interpretation: Rate of 100, sinus tachycardia, normal axis, normal intervals.  No acute ST elevations or depressions   RADIOLOGY Independently interpreted chest x-ray with no acute pathology   PROCEDURES:  Critical Care performed: No  Procedures   MEDICATIONS ORDERED IN ED: Medications - No data  to display   IMPRESSION / MDM / ASSESSMENT AND PLAN / ED COURSE  I reviewed the triage vital signs and the nursing notes.                              Differential diagnosis includes, but is not limited to, pneumonia, pneumothorax, asthma exacerbation, seizure, lower suspicion ACS  Patient's presentation is most consistent with acute complicated illness / injury requiring diagnostic workup.  Patient is a 58 year old female presenting today for shortness of breath as well as possible seizure-like activity.  On arrival she is denying all complaints.  She does note alcohol use tonight but is coherent with her baseline.  Shortly after arrival she ripped out IV placed by EMS.  I was able to get story but then she did not want additional treatment here at this time.  I stated that we needed  further workup and she allowed an EKG and chest x-ray which were reassuring.  Vital signs otherwise stable.  Nurses attempted to place another IV and she ripped out as well.  Adamantly refusing all additional testing at this time.  States that she does not want to be in the emergency department any longer.  I explained why we needed to do blood work for further evaluation of today's symptoms.  Patient had capacity and understood risks and benefits of the workup.  She continues to emphasize that she does not want to be in the emergency department any longer and left AMA.  Patient ambulated to the waiting room in no acute distress.     FINAL CLINICAL IMPRESSION(S) / ED DIAGNOSES   Final diagnoses:  Shortness of breath     Rx / DC Orders   ED Discharge Orders     None        Note:  This document was prepared using Dragon voice recognition software and may include unintentional dictation errors.   Shelly Lima, MD 03/19/23 [phone]

## 2023-03-19 NOTE — ED Notes (Addendum)
This RN Went to go start an IV on this patient at this time. Once the IV was inserted and this RN got flash back and was attempting to thread the catheter when the patient starts screaming and thrashing and took the catheter out of her arm and threw in at staff. Patient yelling at staff and becoming verbally aggressive. Anner Crete, MD made aware. Patient refusing labs at this time. Durene Cal, RN aware.

## 2023-04-09 ENCOUNTER — Encounter: Payer: Self-pay | Admitting: Emergency Medicine

## 2023-04-09 ENCOUNTER — Other Ambulatory Visit: Payer: Self-pay

## 2023-04-09 ENCOUNTER — Emergency Department
Admission: EM | Admit: 2023-04-09 | Discharge: 2023-04-09 | Disposition: A | Discharge status: Home / Self Care | Attending: Emergency Medicine | Admitting: Emergency Medicine

## 2023-04-09 DIAGNOSIS — F10120 Alcohol abuse with intoxication, uncomplicated: Secondary | ICD-10-CM | POA: Insufficient documentation

## 2023-04-09 DIAGNOSIS — Z79899 Other long term (current) drug therapy: Secondary | ICD-10-CM | POA: Diagnosis not present

## 2023-04-09 DIAGNOSIS — R4781 Slurred speech: Secondary | ICD-10-CM | POA: Diagnosis present

## 2023-04-09 DIAGNOSIS — R1011 Right upper quadrant pain: Secondary | ICD-10-CM | POA: Insufficient documentation

## 2023-04-09 DIAGNOSIS — R109 Unspecified abdominal pain: Secondary | ICD-10-CM

## 2023-04-09 DIAGNOSIS — F10929 Alcohol use, unspecified with intoxication, unspecified: Secondary | ICD-10-CM

## 2023-04-09 LAB — COMPREHENSIVE METABOLIC PANEL
ALT: 77 U/L — ABNORMAL HIGH (ref 0–44)
AST: 128 U/L — ABNORMAL HIGH (ref 15–41)
Albumin: 4.3 g/dL (ref 3.5–5.0)
Alkaline Phosphatase: 83 U/L (ref 38–126)
Anion gap: 13 (ref 5–15)
BUN: 8 mg/dL (ref 6–20)
CO2: 19 mmol/L — ABNORMAL LOW (ref 22–32)
Calcium: 9.1 mg/dL (ref 8.9–10.3)
Chloride: 101 mmol/L (ref 98–111)
Creatinine, Ser: 0.54 mg/dL (ref 0.44–1.00)
GFR, Estimated: >60 mL/min (ref >60)
Glucose, Bld: 97 mg/dL (ref 70–99)
Potassium: 4 mmol/L (ref 3.5–5.1)
Sodium: 133 mmol/L — ABNORMAL LOW (ref 135–145)
Total Bilirubin: 0.7 mg/dL (ref 0.0–1.2)
Total Protein: 8 g/dL (ref 6.5–8.1)

## 2023-04-09 LAB — CBC WITH DIFFERENTIAL/PLATELET
Abs Immature Granulocytes: 0 10*3/uL (ref 0.00–0.07)
Basophils Absolute: 0 10*3/uL (ref 0.0–0.1)
Basophils Relative: 1 %
Eosinophils Absolute: 0 10*3/uL (ref 0.0–0.5)
Eosinophils Relative: 0 %
HCT: 37.1 % (ref 36.0–46.0)
Hemoglobin: 13.2 g/dL (ref 12.0–15.0)
Immature Granulocytes: 0 %
Lymphocytes Relative: 29 %
Lymphs Abs: 1.2 10*3/uL (ref 0.7–4.0)
MCH: 32 pg (ref 26.0–34.0)
MCHC: 35.6 g/dL (ref 30.0–36.0)
MCV: 89.8 fL (ref 80.0–100.0)
Monocytes Absolute: 0.3 10*3/uL (ref 0.1–1.0)
Monocytes Relative: 8 %
Neutro Abs: 2.7 10*3/uL (ref 1.7–7.7)
Neutrophils Relative %: 62 %
Platelets: 172 10*3/uL (ref 150–400)
RBC: 4.13 MIL/uL (ref 3.87–5.11)
RDW: 13.2 % (ref 11.5–15.5)
WBC: 4.2 10*3/uL (ref 4.0–10.5)
nRBC: 0 % (ref 0.0–0.2)

## 2023-04-09 LAB — ETHANOL: Alcohol, Ethyl (B): 270 mg/dL — ABNORMAL HIGH (ref <10)

## 2023-04-09 LAB — TROPONIN I (HIGH SENSITIVITY): Troponin I (High Sensitivity): 6 ng/L (ref <18)

## 2023-04-09 LAB — LIPASE, BLOOD: Lipase: 49 U/L (ref 11–51)

## 2023-04-09 NOTE — ED Notes (Signed)
 Pt called out stating her ride was here and she was ready for the doctor to release her. York Cerise, MD made aware.

## 2023-04-09 NOTE — Discharge Instructions (Signed)
 As we discussed, you are leaving before your lab work is back, but since you feel better, it is your right to do so.  Please check MyChart for the results and return immediately to the emergency department if you develop new or worsening symptoms that concern you.

## 2023-04-09 NOTE — ED Provider Notes (Signed)
 [facility] Provider Note    Event Date/Time   First MD Initiated Contact with Patient 04/09/23 0217     (approximate)   History   Abdominal Pain (Pt arrives from home c/o RLQ pain that started tonight. Pt states "she got up and got a cramp and points to RLQ"; pt states her lower abd is swollen. Pt reports drinking 6-7 beers tonight)   HPI [name] is a 58 y.o. female arrives from home complaining of pain in the right upper part of her abdomen.  She says that she took her seizure medicine tonight and drank 6 or 7 beers.  She states "I smoked crack, but just a little bit of it".  She then went to bed and when she got up she felt a cramp in her side so she decided to get checked out.  She says that she had a little bit of a pain in her chest when she was getting checked in, but that does not have anything to do with the crack, and now she feels better and is ready to go.  She called for a ride and as I was coming in the room she was trying to walk out with her IV in place.  She says she is fine and wants to leave and follow-up as an outpatient.  She is slurring her speech slightly but is ambulating without any difficulty.     Physical Exam   Triage Vital Signs: ED Triage Vitals  Encounter Vitals Group     BP 04/09/23 0220 124/83     Systolic BP Percentile --      Diastolic BP Percentile --      Pulse Rate 04/09/23 0220 (!) 114     Resp 04/09/23 0220 16     Temp 04/09/23 0220 97.9 F (36.6 C)     Temp Source 04/09/23 0220 Oral     SpO2 04/09/23 0220 96 %     Weight --      Height --      Head Circumference --      Peak Flow --      Pain Score 04/09/23 0221 3     Pain Loc --      Pain Education --      Exclude from Growth Chart --     Most recent vital signs: Vitals:   04/09/23 0220 04/09/23 0230  BP: 124/83 119/70  Pulse: (!) 114 (!) 110  Resp: 16 15  Temp: 97.9 F (36.6 C)   SpO2: 96% 97%    General: Awake, alert, no apparent  distress.  Slurring her speech slightly consistent with her report of alcohol intoxication, but is otherwise well-appearing. CV:  Good peripheral perfusion.  Mild tachycardia, regular rhythm. Resp:  Normal effort. Speaking easily and comfortably, no accessory muscle usage nor intercostal retractions.   Abd:  No distention.  No tenderness to palpation of the abdomen including the right upper quadrant where she says that she had pain (of note, she told the nurse when she was triage that she was having pain in the right lower quadrant, but she has no tenderness to palpation there either).   ED Results / Procedures / Treatments   Labs (all labs ordered are listed, but only abnormal results are displayed) Labs Reviewed  ETHANOL - Abnormal; Notable for the following components:      Result Value   Alcohol, Ethyl (B) 270 (*)    All other components within normal  limits  CBC WITH DIFFERENTIAL/PLATELET  COMPREHENSIVE METABOLIC PANEL  LIPASE, BLOOD  TROPONIN I (HIGH SENSITIVITY)     EKG  ED ECG REPORT I, Loleta Rose, the attending physician, personally viewed and interpreted this ECG.  Date: 04/09/2023 EKG Time: 2:38 AM Rate: 109 Rhythm: Sinus tachycardia QRS Axis: normal Intervals: normal ST/T Wave abnormalities: Non-specific ST segment / T-wave changes, but no clear evidence of acute ischemia. Narrative Interpretation: no definitive evidence of acute ischemia; does not meet STEMI criteria.    PROCEDURES:  Critical Care performed: No  Procedures    IMPRESSION / MDM / ASSESSMENT AND PLAN / ED COURSE  I reviewed the triage vital signs and the nursing notes.                              Differential diagnosis includes, but is not limited to, alcohol intoxication, musculoskeletal strain, biliary colic, diverticulitis.  Patient's presentation is most consistent with acute presentation with potential threat to life or bodily function.  Labs/studies ordered: CBC with  differential, ethanol level, high-sensitivity troponin, lipase, CMP, EKG  Interventions/Medications given:  Medications - No data to display  (Note:  hospital course my include additional interventions and/or labs/studies not listed above.)   No evidence of ischemia on EKG, mild tachycardia, otherwise normal vital signs.  Clearly intoxicated but is walking without any difficulty and is conversant despite slightly slurred speech.  The patient's high-sensitivity troponin, lipase, and comprehensive metabolic panel have not yet resulted, but her exam is reassuring.  The patient is adamant that she wants to leave and her ride is already waiting for her.  I had to make the decision about whether to let her go or not, but even though 1 could argue she is not at full capacity given her intoxication, I do not believe that she is in any immediate danger, and keeping her against her will would be a violation of her civil rights and not appropriate from a medical or legal/ethical standpoint.  The patient is going home with a sober adult and our nursing staff escorted her to the car to make sure that she was safe.  She knows she can follow-up at any time and was able to verbalize to me that she could check MyChart to check her lab work.       FINAL CLINICAL IMPRESSION(S) / ED DIAGNOSES   Final diagnoses:  Abdominal pain, unspecified abdominal location  Alcoholic intoxication with complication (HCC)     Rx / DC Orders   ED Discharge Orders     None        Note:  This document was prepared using Dragon voice recognition software and may include unintentional dictation errors.   Loleta Rose, MD 04/09/23 [phone]

## 2023-04-09 NOTE — ED Notes (Signed)
 This RN at bedside. Pt insisting her ride is here and she needs to leave. Dr. York Cerise at bedside. Pt decided she did not want to stay. This RN removed IV and ensured pt had a safe ride home.

## 2023-06-10 ENCOUNTER — Ambulatory Visit: Admitting: Cardiovascular Disease

## 2023-08-18 ENCOUNTER — Telehealth: Payer: Self-pay | Admitting: *Deleted

## 2023-08-18 NOTE — Telephone Encounter (Signed)
 Unable to LVM to verify card hx.

## 2023-08-23 DIAGNOSIS — R0602 Shortness of breath: Secondary | ICD-10-CM | POA: Insufficient documentation

## 2023-08-23 NOTE — Progress Notes (Deleted)
 Cardiology Office Note  Date:  08/23/2023   ID:  [name], DOB 1966/01/05, MRN [id number]  PCP:  Inc, [facility]   No chief complaint on file.   HPI:  [name] is a 58 year old woman with past medical history of Chronic pain Bipolar/schizophrenia Asthma Seizures, alcohol abuse Substance abuse/crack Who presents by referral from Catheryn Devonshire for palpitations, orthopnea  Seen in the emergency room March 18, 2023 shortness of breath She pulled out her IVs, declined workup Left AMA  Seen in the emergency room March 25 abdominal pain, crack Tachycardic, alcohol  PMH:   has a past medical history of Acute renal failure (ARF) (HCC) (01/07/2018), Allergy, Anemia, Arthritis, Asthma, Bipolar disorder (HCC), Cocaine abuse in remission (HCC), Depression, Dyspnea, Eczema, Nervous, Schizophrenia (HCC), Scoliosis, Seizure (HCC), Seizures (HCC), Stroke (HCC) (2007), and Tobacco use.  PSH:    Past Surgical History:  Procedure Laterality Date   APPENDECTOMY  2000   BREAST BIOPSY Right 03/28/2021   right breast stereo ribbon clip-path pending   BREAST CYST EXCISION Bilateral 05/29/2016   Procedure: MASS EXCISION CHEST WALL;  Surgeon: Carlin Pastel, MD;  Location: [facility] ORS;  Service: General;  Laterality: Bilateral;   DILATION AND CURETTAGE OF UTERUS      Current Outpatient Medications  Medication Sig Dispense Refill   ACETAMINOPHEN  EXTRA STRENGTH 500 MG tablet Take 500 mg by mouth every 6 (six) hours as needed.     albuterol  (VENTOLIN  HFA) 108 (90 Base) MCG/ACT inhaler      aspirin  EC 81 MG tablet Take 81 mg by mouth daily.     benzocaine  (AMERICAINE) 20 % rectal ointment Place rectally every 6 (six) hours as needed for pain. 28.4 g 0   busPIRone  (BUSPAR ) 7.5 MG tablet Take 1 tablet (7.5 mg total) by mouth 2 (two) times daily. 2 tablet 0   divalproex  (DEPAKOTE ) 500 MG DR tablet Take 1 tab in the morning and 2 tabs at night 90 tablet 1   gabapentin   (NEURONTIN ) 100 MG capsule Take 1 capsule (100 mg total) by mouth 3 (three) times daily. (Patient not taking: Reported on 08/31/2018) 30 capsule 0   phenytoin  (DILANTIN ) 100 MG ER capsule Take 2 capsules (200 mg total) by mouth at bedtime. 60 capsule 1   QUEtiapine  (SEROQUEL ) 100 MG tablet Take 1 tablet (100 mg total) by mouth at bedtime. 1 tablet 0   vitamin B-12 (CYANOCOBALAMIN) 1000 MCG tablet      VITAMIN D-1000 MAX ST 25 MCG (1000 UT) tablet Take 1,000 Units by mouth daily.     No current facility-administered medications for this visit.     Allergies:   Flagyl [metronidazole]   Social History:  The patient  reports that she has been smoking cigarettes. She has a 9.8 pack-year smoking history. She has never used smokeless tobacco. She reports current alcohol use of about 6.0 standard drinks of alcohol per week. She reports current drug use. Drug: Cocaine.   Family History:   family history includes Epilepsy in her maternal grandmother and mother; Heart attack in her maternal grandmother; Heart disease in her father; Seizures in her mother.    Review of Systems: ROS   PHYSICAL EXAM: VS:  LMP  (LMP Unknown)  , BMI There is no height or weight on file to calculate BMI. GEN: Well nourished, well developed, in no acute distress HEENT: normal Neck: no JVD, carotid bruits, or masses Cardiac: RRR; no murmurs, rubs, or gallops,no edema  Respiratory:  clear  to auscultation bilaterally, normal work of breathing GI: soft, nontender, nondistended, + BS MS: no deformity or atrophy Skin: warm and dry, no rash Neuro:  Strength and sensation are intact Psych: euthymic mood, full affect    Recent Labs: 04/09/2023: ALT 77; BUN 8; Creatinine, Ser 0.54; Hemoglobin 13.2; Platelets 172; Potassium 4.0; Sodium 133    Lipid Panel Lab Results  Component Value Date   CHOL 166 08/15/2017   HDL 101 08/15/2017   LDLCALC 57 08/15/2017   TRIG 42 08/15/2017      Wt Readings from Last 3 Encounters:   10/18/22 98 lb (44.5 kg)  05/14/22 100 lb 15.5 oz (45.8 kg)  11/17/21 101 lb (45.8 kg)       ASSESSMENT AND PLAN:  Problem List Items Addressed This Visit   None    Disposition:   F/U  12 months   Total encounter time more than 30 minutes  Greater than 50% was spent in counseling and coordination of care with the patient    Signed, Velinda Lunger, M.D., Ph.D. [facility] [place], [place] [phone]

## 2023-08-25 ENCOUNTER — Ambulatory Visit: Attending: Cardiovascular Disease | Admitting: Cardiovascular Disease

## 2023-08-25 DIAGNOSIS — R0602 Shortness of breath: Secondary | ICD-10-CM

## 2023-08-25 DIAGNOSIS — F319 Bipolar disorder, unspecified: Secondary | ICD-10-CM

## 2023-08-25 DIAGNOSIS — F1910 Other psychoactive substance abuse, uncomplicated: Secondary | ICD-10-CM

## 2023-08-25 DIAGNOSIS — F1411 Cocaine abuse, in remission: Secondary | ICD-10-CM

## 2023-09-03 ENCOUNTER — Other Ambulatory Visit: Payer: Self-pay | Admitting: Nurse Practitioner

## 2023-09-03 DIAGNOSIS — Z1231 Encounter for screening mammogram for malignant neoplasm of breast: Secondary | ICD-10-CM

## 2023-09-16 ENCOUNTER — Other Ambulatory Visit: Payer: Self-pay

## 2023-09-16 DIAGNOSIS — Z122 Encounter for screening for malignant neoplasm of respiratory organs: Secondary | ICD-10-CM

## 2023-09-16 DIAGNOSIS — F17210 Nicotine dependence, cigarettes, uncomplicated: Secondary | ICD-10-CM

## 2023-09-16 DIAGNOSIS — Z87891 Personal history of nicotine dependence: Secondary | ICD-10-CM

## 2023-09-23 ENCOUNTER — Ambulatory Visit
Admission: RE | Admit: 2023-09-23 | Discharge: 2023-09-23 | Disposition: A | Discharge status: Home / Self Care | Source: Ambulatory Visit | Attending: Nurse Practitioner | Admitting: Nurse Practitioner

## 2023-09-23 DIAGNOSIS — Z1231 Encounter for screening mammogram for malignant neoplasm of breast: Secondary | ICD-10-CM | POA: Insufficient documentation

## 2023-10-21 ENCOUNTER — Ambulatory Visit: Admitting: Cardiology

## 2023-11-19 ENCOUNTER — Ambulatory Visit: Admitting: Cardiology

## 2023-11-19 ENCOUNTER — Encounter: Payer: Self-pay | Admitting: Cardiology

## 2023-11-19 VITALS — BP 119/64 | HR 75 | Wt 108.8 lb

## 2023-11-25 NOTE — Progress Notes (Unsigned)
 Cardiology Office Note   Date:  11/26/2023  ID:  Sharene, Krikorian 02-04-1966, MRN [id number] PCP: [facility]  [facility] Providers Cardiologist:  Caron Poser, MD     History of Present Illness [name] is a 58 y.o. female PMH polysubstance abuse, alcohol dependence, seizure disorder, reported history of MI who presents for further management of DOE.  Patient reports dyspnea with exertion.  She says that she has about 20-25 stairs leading up to her apartment and she has significant dyspnea with this climb every day.  She denies orthopnea or LE edema.  She also reports chest discomfort which sometimes happen as at rest, sometimes with exertion.  She says that she no longer smokes but was a longtime smoker in the past.  Also reports a history of crack cocaine use.  She refused ECG in office today. Last LDL 57 08/2017.  Relevant CVD History -CT chest 06/2020 three-vessel CAC with aortic atherosclerosis -TTE 08/2017 normal biventricular function without significant valvular disease -Carotid Doppler 08/2017 with mild plaque and less than 50% stenosis bilaterally   ROS: Pt denies any chest discomfort, jaw pain, arm pain, palpitations, syncope, presyncope, orthopnea, PND, or LE edema.  Studies Reviewed I have independently reviewed the patient's ECG, previous cardiac testing, recent medical records.  Physical Exam VS:  BP (!) 110/58 (BP Location: Left Arm, Patient Position: Sitting, Cuff Size: Normal)   Pulse (!) 104   Ht 5' 4 (1.626 m)   Wt 108 lb 3.2 oz (49.1 kg)   LMP  (LMP Unknown)   SpO2 99%   BMI 18.57 kg/m        Wt Readings from Last 3 Encounters:  11/26/23 108 lb 3.2 oz (49.1 kg)  11/19/23 108 lb 12.8 oz (49.4 kg)  10/18/22 98 lb (44.5 kg)    GEN: No acute distress. NECK: No JVD; No carotid bruits. CARDIAC: RRR, no murmurs, rubs, gallops. RESPIRATORY:  Clear to auscultation. EXTREMITIES:  Warm and well-perfused. No  edema.  ASSESSMENT AND PLAN Chest discomfort DOE History of cocaine abuse Patient reports undifferentiated dyspnea and chest discomfort.  She likely has underlying COPD, so it could be coming from this.  However, she reports a history of MI in the early 2000s.  Per her report, no stents were placed.  I also do not see any stents on his CT chest from 2022.  She certainly has risk factors for obstructive CAD and HF.  Further cardiac testing is indicated.  Plan: - Echocardiogram to evaluate for structural causes - She does have three-vessel calcium , so I discussed obtaining a stress PET scan.  She is not interested and refused this.  Alternatively, we discussed a coronary CT angiogram to which she is amenable.  Per her report, she does not have any stents and I do not see any from a CT chest 2022. - Advised continued drug abstinence, especially cocaine  History of MI CAC HLD Aortic atherosclerosis Patient reports a history of MI in the early 2000's.  She says that previous providers have told her that she does not need to be on aspirin  or statin.  I discussed that she should be on both an aspirin  and a statin for secondary prevention. She says she is amenable.  Last LDL 57 in 2019.  Plan: - Start ASA 81 mg daily - Start Crestor 5 mg daily - Recheck lipids; LDL goal less than 55 given prior event        Dispo: RTC as  needed based on results of cardiac testing  Signed, Caron Poser, MD

## 2023-11-26 ENCOUNTER — Ambulatory Visit

## 2023-11-26 VITALS — BP 110/58 | HR 104 | Ht 64.0 in | Wt 108.2 lb

## 2023-11-26 DIAGNOSIS — I2510 Atherosclerotic heart disease of native coronary artery without angina pectoris: Secondary | ICD-10-CM

## 2023-11-26 DIAGNOSIS — I700 Atherosclerosis of aorta: Secondary | ICD-10-CM | POA: Diagnosis not present

## 2023-11-26 DIAGNOSIS — R079 Chest pain, unspecified: Secondary | ICD-10-CM | POA: Diagnosis not present

## 2023-11-26 DIAGNOSIS — F1411 Cocaine abuse, in remission: Secondary | ICD-10-CM

## 2023-11-26 DIAGNOSIS — I252 Old myocardial infarction: Secondary | ICD-10-CM

## 2023-11-26 DIAGNOSIS — R0609 Other forms of dyspnea: Secondary | ICD-10-CM

## 2023-11-26 DIAGNOSIS — Z79899 Other long term (current) drug therapy: Secondary | ICD-10-CM

## 2023-11-26 DIAGNOSIS — E782 Mixed hyperlipidemia: Secondary | ICD-10-CM

## 2023-11-26 MED ORDER — ROSUVASTATIN CALCIUM 5 MG PO TABS: 5.0000 mg | ORAL_TABLET | Freq: Every day | ORAL | 3 refills | Status: DC

## 2023-11-26 MED ORDER — METOPROLOL TARTRATE 50 MG PO TABS: ORAL_TABLET | ORAL | 0 refills | Status: DC

## 2023-11-26 NOTE — Patient Instructions (Signed)
 Medication Instructions:  Your physician recommends the following medication changes.  START TAKING: Aspirin  81 mg by  mouth once a day Rosuvastating (Crestor) 5 mg once a day   Take all other medications as prescribed  *If you need a refill on your cardiac medications before your next appointment, please call your pharmacy*  Lab Work: Your provider would like for you to have following labs drawn today BMP, LIPID PANEL.   If you have labs (blood work) drawn today and your tests are completely normal, you will receive your results only by: MyChart Message (if you have MyChart) OR A paper copy in the mail If you have any lab test that is abnormal or we need to change your treatment, we will call you to review the results.  Testing/Procedures: Your physician has requested that you have an echocardiogram. Echocardiography is a painless test that uses sound waves to create images of your heart. It provides your doctor with information about the size and shape of your heart and how well your heart's chambers and valves are working.   You may receive an ultrasound enhancing agent through an IV if needed to better visualize your heart during the echo. This procedure takes approximately one hour.  There are no restrictions for this procedure.  This will take place at [address] [facility]) [address], [place] [address]  Please note: We ask at that you not bring children with you during ultrasound (echo/ vascular) testing. Due to room size and safety concerns, children are not allowed in the ultrasound rooms during exams. Our front office staff cannot provide observation of children in our lobby area while testing is being conducted. An adult accompanying a patient to their appointment will only be allowed in the ultrasound room at the discretion of the ultrasound technician under special circumstances. We apologize for any inconvenience.     Your cardiac CT will be scheduled at  one of the below locations:   [facility] [address] [place], [place] [address] [phone]  Please arrive 15 mins early for check-in and test prep.  There is spacious parking Copy Available) and easy access to the radiology department from the [facility] entrance. Please enter here and check-in with the desk attendant.   Please follow these instructions carefully (unless otherwise directed):  An IV will be required for this test and Nitroglycerin will be given.  Hold all erectile dysfunction medications at least 3 days (72 hrs) prior to test. (Ie viagra, cialis, sildenafil, tadalafil, etc)   On the Night Before the Test: Be sure to Drink plenty of water. Do not consume any caffeinated/decaffeinated beverages or chocolate 12 hours prior to your test. Do not take any antihistamines 12 hours prior to your test.  On the Day of the Test: Drink plenty of water until 1 hour prior to the test. Do not eat any food 1 hour prior to test. You may take your regular medications prior to the test.  Take metoprolol (Lopressor) 50 mg two hours prior to test. FEMALES- please wear underwire-free bra if available, avoid dresses & tight clothing        After the Test: Drink plenty of water. After receiving IV contrast, you may experience a mild flushed feeling. This is normal. On occasion, you may experience a mild rash up to 24 hours after the test. This is not dangerous. If this occurs, you can take Benadryl  25 mg, Zyrtec, Claritin, or Allegra and increase  your fluid intake. (Patients taking Tikosyn should avoid Benadryl , and may take Zyrtec, Claritin, or Allegra) If you experience trouble breathing, this can be serious. If it is severe call 911 IMMEDIATELY. If it is mild, please call our office.  We will call to schedule your test 2-4 weeks out understanding that some insurance companies will need an authorization prior to the service being performed.    For more information and frequently asked questions, please visit our website : [email or web address]  For non-scheduling related questions, please contact the cardiac imaging nurse navigator should you have any questions/concerns: Cardiac Imaging Nurse Navigators Direct Office Dial: [phone]   For scheduling needs, including cancellations and rescheduling, please call [place], [phone].    Follow-Up: At [facility], you and your health needs are our priority.  As part of our continuing mission to provide you with exceptional heart care, our providers are all part of one team.  This team includes your primary Cardiologist (physician) and Advanced Practice Providers or APPs (Physician Assistants and Nurse Practitioners) who all work together to provide you with the care you need, when you need it.  Your next appointment:   3 month(s)  Provider:   You may see Caron Poser, MD or one of the following Advanced Practice Providers on your designated Care Team:   Lonni Meager, NP Lesley Maffucci, PA-C Bernardino Bring, PA-C Cadence Mantua, PA-C Tylene Lunch, NP Barnie Hila, NP   We recommend signing up for the patient portal called MyChart.  Sign up information is provided on this After Visit Summary.  MyChart is used to connect with patients for Virtual Visits (Telemedicine).  Patients are able to view lab/test results, encounter notes, upcoming appointments, etc.  Non-urgent messages can be sent to your provider as well.   To learn more about what you can do with MyChart, go to [email or web address].

## 2023-11-27 ENCOUNTER — Ambulatory Visit: Payer: Self-pay

## 2023-11-27 LAB — LIPID PANEL
Chol/HDL Ratio: 1.7 ratio (ref 0.0–4.4)
Cholesterol, Total: 166 mg/dL (ref 100–199)
HDL: 98 mg/dL (ref >39)
LDL Chol Calc (NIH): 50 mg/dL (ref 0–99)
Triglycerides: 103 mg/dL (ref 0–149)
VLDL Cholesterol Cal: 18 mg/dL (ref 5–40)

## 2023-11-27 LAB — BASIC METABOLIC PANEL WITH GFR
BUN/Creatinine Ratio: 10 — ABNORMAL LOW (ref 12–28)
BUN: 7 mg/dL — ABNORMAL LOW (ref 8–27)
CO2: 21 mmol/L (ref 20–29)
Calcium: 9.5 mg/dL (ref 8.7–10.3)
Chloride: 103 mmol/L (ref 96–106)
Creatinine, Ser: 0.73 mg/dL (ref 0.57–1.00)
Glucose: 108 mg/dL — ABNORMAL HIGH (ref 70–99)
Potassium: 4.1 mmol/L (ref 3.5–5.2)
Sodium: 140 mmol/L (ref 134–144)
eGFR: 94 mL/min/1.73 (ref >59)

## 2023-11-27 NOTE — Telephone Encounter (Signed)
 Attempted to call patient to get the name of the pharmacy that she would like the Metoprolol Tartrate 50 mg sent for the Cardiac CT Scan; no answer, no voicemail.  Will try again later.

## 2023-11-28 MED ORDER — METOPROLOL TARTRATE 50 MG PO TABS: 50.0000 mg | ORAL_TABLET | Freq: Once | ORAL | 0 refills | Status: DC

## 2023-11-28 NOTE — Addendum Note (Signed)
 Addended by: HARL HERON DEL on: 11/28/2023 11:52 AM   Modules accepted: Orders

## 2023-11-28 NOTE — Telephone Encounter (Signed)
 Returned pt's call and verified identity using 2 identifiers.  Pt stated that she picked up her medications and that she took the Metoprolol 50 mg and realized she was suppose to wait to take it for her CT Scan.  I explained that we could send her another one time dose.  She stated she wanted specific instructions on the bottle so she won't take it wrong.  I reassured her that it would have dates and times as to when to take it.  New order has been placed with specific instructions to take on 11/6 @ 1:30 for Coronary CT ScanShe thanked me for calling her.

## 2023-12-01 ENCOUNTER — Telehealth: Payer: Self-pay

## 2023-12-01 NOTE — Telephone Encounter (Signed)
 Pt returning call. Did not see note. Please advise.

## 2023-12-01 NOTE — Telephone Encounter (Signed)
 Returned patients call; pt was checking to see if a replacement prescription for the Metoprolol 50 mg that she took accidentally had been sent to [facility].  I explained to her that I had sent the  replacement prescription.  We also went over her appointment times for the Coronary CT scan and Echocardiogram.  She wants a message sent to her MyChart reminding her of the appointments and medication.I reassured her that I would send her a MyChart message.

## 2023-12-08 ENCOUNTER — Other Ambulatory Visit: Payer: Self-pay

## 2023-12-08 ENCOUNTER — Emergency Department
Admission: EM | Admit: 2023-12-08 | Discharge: 2023-12-09 | Disposition: A | Discharge status: Home / Self Care | Attending: Emergency Medicine | Admitting: Emergency Medicine

## 2023-12-08 ENCOUNTER — Emergency Department

## 2023-12-08 DIAGNOSIS — F10920 Alcohol use, unspecified with intoxication, uncomplicated: Secondary | ICD-10-CM

## 2023-12-08 DIAGNOSIS — Y908 Blood alcohol level of 240 mg/100 ml or more: Secondary | ICD-10-CM | POA: Diagnosis not present

## 2023-12-08 DIAGNOSIS — R0789 Other chest pain: Secondary | ICD-10-CM | POA: Diagnosis not present

## 2023-12-08 DIAGNOSIS — M532X1 Spinal instabilities, occipito-atlanto-axial region: Secondary | ICD-10-CM | POA: Diagnosis not present

## 2023-12-08 DIAGNOSIS — R079 Chest pain, unspecified: Secondary | ICD-10-CM

## 2023-12-08 DIAGNOSIS — R569 Unspecified convulsions: Secondary | ICD-10-CM | POA: Diagnosis not present

## 2023-12-08 DIAGNOSIS — F10129 Alcohol abuse with intoxication, unspecified: Secondary | ICD-10-CM | POA: Diagnosis not present

## 2023-12-08 DIAGNOSIS — R4182 Altered mental status, unspecified: Secondary | ICD-10-CM | POA: Diagnosis present

## 2023-12-08 LAB — ETHANOL: Alcohol, Ethyl (B): 273 mg/dL — ABNORMAL HIGH (ref <15)

## 2023-12-08 LAB — CBC WITH DIFFERENTIAL/PLATELET
Abs Immature Granulocytes: 0.02 K/uL (ref 0.00–0.07)
Basophils Absolute: 0 K/uL (ref 0.0–0.1)
Basophils Relative: 0 %
Eosinophils Absolute: 0.1 K/uL (ref 0.0–0.5)
Eosinophils Relative: 1 %
HCT: 35.6 % — ABNORMAL LOW (ref 36.0–46.0)
Hemoglobin: 12.2 g/dL (ref 12.0–15.0)
Immature Granulocytes: 0 %
Lymphocytes Relative: 38 %
Lymphs Abs: 2.3 K/uL (ref 0.7–4.0)
MCH: 31.5 pg (ref 26.0–34.0)
MCHC: 34.3 g/dL (ref 30.0–36.0)
MCV: 92 fL (ref 80.0–100.0)
Monocytes Absolute: 0.5 K/uL (ref 0.1–1.0)
Monocytes Relative: 9 %
Neutro Abs: 3.1 K/uL (ref 1.7–7.7)
Neutrophils Relative %: 52 %
Platelets: 155 K/uL (ref 150–400)
RBC: 3.87 MIL/uL (ref 3.87–5.11)
RDW: 13.8 % (ref 11.5–15.5)
WBC: 6 K/uL (ref 4.0–10.5)
nRBC: 0 % (ref 0.0–0.2)

## 2023-12-08 LAB — TROPONIN I (HIGH SENSITIVITY): Troponin I (High Sensitivity): 6 ng/L (ref <18)

## 2023-12-08 MED ORDER — SODIUM CHLORIDE 0.9 % IV BOLUS
500.0000 mL | Freq: Once | INTRAVENOUS | Status: AC
  Administered 2023-12-08: 500 mL via INTRAVENOUS

## 2023-12-08 MED ORDER — DIAZEPAM 5 MG/ML IJ SOLN
10.0000 mg | Freq: Once | INTRAMUSCULAR | Status: AC
  Administered 2023-12-08: 10 mg via INTRAVENOUS
  Filled 2023-12-08: qty 2
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
## 2023-12-08 NOTE — ED Notes (Signed)
 Ariel, EDT remains at bedside as 1:1 sitter. Pericare performed and clean chucks and brief put on pt.

## 2023-12-08 NOTE — ED Triage Notes (Signed)
 BIB [facility] from home with initial CC of CP. On arrival EMS reports pt had approx 25 seconds of generalized shaking and seizure-like activity. EMS gave 5 Versed , pt agitated and combative, EMS gave another 5 Versed . Pt sleeping at this time.

## 2023-12-08 NOTE — ED Notes (Signed)
 Ariel, EDT sitting in room with pt as 1:1 sitter at this time per MD wong.

## 2023-12-09 ENCOUNTER — Emergency Department

## 2023-12-09 LAB — URINALYSIS, W/ REFLEX TO CULTURE (INFECTION SUSPECTED)
Bacteria, UA: NONE SEEN
Bilirubin Urine: NEGATIVE
Glucose, UA: NEGATIVE mg/dL
Hgb urine dipstick: NEGATIVE
Ketones, ur: NEGATIVE mg/dL
Leukocytes,Ua: NEGATIVE
Nitrite: NEGATIVE
Protein, ur: NEGATIVE mg/dL
Specific Gravity, Urine: 1.002 — ABNORMAL LOW (ref 1.005–1.030)
Squamous Epithelial / HPF: 0 /HPF (ref 0–5)
pH: 5 (ref 5.0–8.0)

## 2023-12-09 LAB — COMPREHENSIVE METABOLIC PANEL WITH GFR
ALT: 33 U/L (ref 0–44)
AST: 51 U/L — ABNORMAL HIGH (ref 15–41)
Albumin: 3.9 g/dL (ref 3.5–5.0)
Alkaline Phosphatase: 65 U/L (ref 38–126)
Anion gap: 14 (ref 5–15)
BUN: 7 mg/dL (ref 6–20)
CO2: 21 mmol/L — ABNORMAL LOW (ref 22–32)
Calcium: 8.8 mg/dL — ABNORMAL LOW (ref 8.9–10.3)
Chloride: 105 mmol/L (ref 98–111)
Creatinine, Ser: 0.43 mg/dL — ABNORMAL LOW (ref 0.44–1.00)
GFR, Estimated: >60 mL/min (ref >60)
Glucose, Bld: 85 mg/dL (ref 70–99)
Potassium: 3.6 mmol/L (ref 3.5–5.1)
Sodium: 140 mmol/L (ref 135–145)
Total Bilirubin: 0.7 mg/dL (ref 0.0–1.2)
Total Protein: 7.5 g/dL (ref 6.5–8.1)

## 2023-12-09 LAB — URINE DRUG SCREEN, QUALITATIVE (ARMC ONLY)
Amphetamines, Ur Screen: NOT DETECTED
Barbiturates, Ur Screen: NOT DETECTED
Benzodiazepine, Ur Scrn: POSITIVE — AB
Cannabinoid 50 Ng, Ur ~~LOC~~: NOT DETECTED
Cocaine Metabolite,Ur ~~LOC~~: NOT DETECTED
MDMA (Ecstasy)Ur Screen: NOT DETECTED
Methadone Scn, Ur: NOT DETECTED
Opiate, Ur Screen: NOT DETECTED
Phencyclidine (PCP) Ur S: NOT DETECTED
Tricyclic, Ur Screen: NOT DETECTED

## 2023-12-09 LAB — RAPID HIV SCREEN (HIV 1/2 AB+AG)
HIV 1/2 Antibodies: NONREACTIVE
HIV-1 P24 Antigen - HIV24: NONREACTIVE

## 2023-12-09 LAB — MAGNESIUM: Magnesium: 2.1 mg/dL (ref 1.7–2.4)

## 2023-12-09 LAB — VALPROIC ACID LEVEL: Valproic Acid Lvl: <10 ug/mL — ABNORMAL LOW (ref 50–100)

## 2023-12-09 LAB — D-DIMER, QUANTITATIVE: D-Dimer, Quant: 0.88 ug{FEU}/mL — ABNORMAL HIGH (ref 0.00–0.50)

## 2023-12-09 LAB — TROPONIN I (HIGH SENSITIVITY): Troponin I (High Sensitivity): 5 ng/L (ref <18)

## 2023-12-09 MED ORDER — IOHEXOL 350 MG/ML SOLN
75.0000 mL | Freq: Once | INTRAVENOUS | Status: AC | PRN
  Administered 2023-12-09: 75 mL via INTRAVENOUS

## 2023-12-09 MED ORDER — VALPROATE SODIUM 100 MG/ML IV SOLN
500.0000 mg | Freq: Once | INTRAVENOUS | Status: AC
  Administered 2023-12-09: 500 mg via INTRAVENOUS
  Filled 2023-12-09: qty 5

## 2023-12-09 MED ORDER — DIVALPROEX SODIUM 500 MG PO DR TAB: DELAYED_RELEASE_TABLET | ORAL | 1 refills | Status: DC

## 2023-12-09 MED ORDER — PHENYTOIN SODIUM EXTENDED 100 MG PO CAPS: 200.0000 mg | ORAL_CAPSULE | Freq: Every day | ORAL | 1 refills | Status: DC

## 2023-12-09 NOTE — ED Notes (Addendum)
 Pt returned to room from scans. Ccollar placed back on pt and pt reports collar does help with neck pain - rates neck pain 2/10 at this time. Pt speaking to significant other via telephone.

## 2023-12-09 NOTE — Discharge Instructions (Addendum)
 I have renewed your prescriptions for your Dilantin  and Depakote  to take as prescribed.  Please call Dr. Lane who is a seizure specialist to review your medications and seizure history and make adjustments as needed.  In terms of your chest pain, there were no emergency conditions like heart attack or blood clot found in your testing today that would require hospitalization or surgeries at this time.  Please follow-up with your heart doctor for a checkup.  You have some degenerative changes that is causing instability in your neck bones.  Please wear the neck collar at all times when out of bed.  Call Dr. Reeves Daisy who is a spine specialist for an appointment as soon as possible.  Thank you for choosing us  for your health care today!  Please see your primary doctor this week for a follow up appointment.   If you have any new, worsening, or unexpected symptoms call your doctor right away or come back to the emergency department for reevaluation.  It was my pleasure to care for you today.   Ginnie EDISON Cyrena, MD

## 2023-12-09 NOTE — ED Notes (Signed)
 Pt assisted to restroom.

## 2023-12-09 NOTE — ED Notes (Signed)
 Pt woke up stating that her chest hurt,this tech will let pt's RN know.SABRA

## 2023-12-09 NOTE — ED Notes (Signed)
 C collar placed on pt. Pt compliant at this time

## 2023-12-10 ENCOUNTER — Telehealth (HOSPITAL_COMMUNITY): Payer: Self-pay | Admitting: *Deleted

## 2023-12-10 NOTE — Telephone Encounter (Signed)
 Attempted to call patient regarding upcoming cardiac CT appointment. Left message on voicemail with name and callback number  Larey Brick RN Navigator Cardiac Imaging [facility] [phone] Office [phone] Cell

## 2023-12-11 ENCOUNTER — Ambulatory Visit
Admission: RE | Admit: 2023-12-11 | Discharge: 2023-12-11 | Disposition: A | Discharge status: Home / Self Care | Source: Ambulatory Visit

## 2023-12-11 ENCOUNTER — Telehealth: Payer: Self-pay

## 2023-12-11 DIAGNOSIS — R079 Chest pain, unspecified: Secondary | ICD-10-CM | POA: Diagnosis present

## 2023-12-11 DIAGNOSIS — I2510 Atherosclerotic heart disease of native coronary artery without angina pectoris: Secondary | ICD-10-CM | POA: Insufficient documentation

## 2023-12-11 MED ORDER — IOHEXOL 350 MG/ML SOLN
100.0000 mL | Freq: Once | INTRAVENOUS | Status: AC | PRN
  Administered 2023-12-11: 100 mL via INTRAVENOUS

## 2023-12-11 MED ORDER — DILTIAZEM HCL 25 MG/5ML IV SOLN: 10.0000 mg | INTRAVENOUS | Status: DC | PRN

## 2023-12-11 MED ORDER — METOPROLOL TARTRATE 5 MG/5ML IV SOLN
10.0000 mg | Freq: Once | INTRAVENOUS | Status: AC | PRN
  Administered 2023-12-11: 10 mg via INTRAVENOUS

## 2023-12-11 MED ORDER — NITROGLYCERIN 0.4 MG SL SUBL
0.8000 mg | SUBLINGUAL_TABLET | Freq: Once | SUBLINGUAL | Status: AC
  Administered 2023-12-11: 0.8 mg via SUBLINGUAL
  Filled 2023-12-11: qty 25

## 2023-12-11 MED ORDER — METOPROLOL TARTRATE 5 MG/5ML IV SOLN
INTRAVENOUS | Status: AC
  Filled 2023-12-11: qty 10

## 2023-12-11 NOTE — Telephone Encounter (Signed)
 Patient called to report worsening shortness of breath with exertion since her last office visit on 11/26/23. Patient stated that in her current living arrangement, she must walk up 20 flights of stairs to reach her apartment and becomes very winded, feeling as though she cannot catch her breath. She reported that when carrying groceries, she must stop and rest every 3-4 steps. She also experiences shortness of breath after standing for long periods.  Patient works in a kitchen and noted that when it becomes hot, she develops shortness of breath and must step outside for cool air. She denies any swelling. Patient reported she was seen in the ED on 11/3 for shortness of breath and a seizure. She expressed distress, stating she "can't continue to live like this."  Patient is scheduled for a cardiac CTA today (12/11/23) and an echocardiogram on 01/13/24. Patient was advised to keep today's scheduled test. I informed her that I will contact the scheduling department to see if the echocardiogram can be moved up and will forward this message to Dr. Argentina for further recommendations.

## 2023-12-11 NOTE — Telephone Encounter (Signed)
 Called pt to make her aware of MD's recommendations. Left message to call back.    Shelly Clap, MD to Me  Harl Heron DEL, RN  [facility] Scheduling (Selected Message)    12/11/23 10:09 AM Would recommend she get the testing we have ordered, difficult to know if her symptoms are from her heart or her COPD. If her symptoms are unstable or progressive, then it makes sense for her to go to the ED for further evaluation since she is undifferentiated.

## 2023-12-11 NOTE — Telephone Encounter (Signed)
 Called pt this morning @ 830 am per pt request to remind her to take her Metoprolol (Lopressor) 2 hours prior to her Coronary CT Scan.  Pt verbalized understanding to take the Metoprolol at 1:30 pm, 2 hours prior to her cardiac procedure.

## 2023-12-11 NOTE — Progress Notes (Signed)
 Patient tolerated CT well. Vital signs stable encourage to drink water throughout day.Reasons explained and verbalized understanding. Ccollar intact.

## 2023-12-11 NOTE — Telephone Encounter (Signed)
 Pt c/o Shortness Of Breath: STAT if SOB developed within the last 24 hours or pt is noticeably SOB on the phone  1. Are you currently SOB (can you hear that pt is SOB on the phone)?   No  2. How long have you been experiencing SOB?   Ongoing for over a month  3. Are you SOB when sitting or when up moving around?   When up and moving around  4. Are you currently experiencing any other symptoms?  Slight headache since she had a seizure  Patient stated she gets SOB when up and moving around and wants to know if she needs to be on oxygen.  Patient stated she is currently wearing a neck brace.

## 2023-12-12 LAB — PHENYTOIN LEVEL, FREE AND TOTAL: Phenytoin, Total: <0.8 ug/mL — ABNORMAL LOW (ref 10.0–20.0)

## 2023-12-15 ENCOUNTER — Emergency Department
Admission: EM | Admit: 2023-12-15 | Discharge: 2023-12-15 | Disposition: A | Discharge status: Home / Self Care | Attending: Emergency Medicine | Admitting: Emergency Medicine

## 2023-12-15 ENCOUNTER — Encounter: Payer: Self-pay | Admitting: Emergency Medicine

## 2023-12-15 ENCOUNTER — Emergency Department

## 2023-12-15 DIAGNOSIS — R0789 Other chest pain: Secondary | ICD-10-CM | POA: Insufficient documentation

## 2023-12-15 DIAGNOSIS — R569 Unspecified convulsions: Secondary | ICD-10-CM | POA: Diagnosis not present

## 2023-12-15 DIAGNOSIS — F10920 Alcohol use, unspecified with intoxication, uncomplicated: Secondary | ICD-10-CM

## 2023-12-15 DIAGNOSIS — R079 Chest pain, unspecified: Secondary | ICD-10-CM

## 2023-12-15 DIAGNOSIS — Y908 Blood alcohol level of 240 mg/100 ml or more: Secondary | ICD-10-CM | POA: Insufficient documentation

## 2023-12-15 DIAGNOSIS — F10120 Alcohol abuse with intoxication, uncomplicated: Secondary | ICD-10-CM | POA: Diagnosis present

## 2023-12-15 DIAGNOSIS — R451 Restlessness and agitation: Secondary | ICD-10-CM | POA: Diagnosis not present

## 2023-12-15 DIAGNOSIS — I700 Atherosclerosis of aorta: Secondary | ICD-10-CM | POA: Diagnosis not present

## 2023-12-15 DIAGNOSIS — Z91148 Patient's other noncompliance with medication regimen for other reason: Secondary | ICD-10-CM | POA: Diagnosis not present

## 2023-12-15 LAB — COMPREHENSIVE METABOLIC PANEL WITH GFR
ALT: 43 U/L (ref 0–44)
AST: 78 U/L — ABNORMAL HIGH (ref 15–41)
Albumin: 4.4 g/dL (ref 3.5–5.0)
Alkaline Phosphatase: 82 U/L (ref 38–126)
Anion gap: 11 (ref 5–15)
BUN: 12 mg/dL (ref 6–20)
CO2: 21 mmol/L — ABNORMAL LOW (ref 22–32)
Calcium: 9.5 mg/dL (ref 8.9–10.3)
Chloride: 105 mmol/L (ref 98–111)
Creatinine, Ser: 0.6 mg/dL (ref 0.44–1.00)
GFR, Estimated: >60 mL/min (ref >60)
Glucose, Bld: 93 mg/dL (ref 70–99)
Potassium: 3.8 mmol/L (ref 3.5–5.1)
Sodium: 137 mmol/L (ref 135–145)
Total Bilirubin: 0.7 mg/dL (ref 0.0–1.2)
Total Protein: 8.1 g/dL (ref 6.5–8.1)

## 2023-12-15 LAB — CBC
HCT: 39.8 % (ref 36.0–46.0)
Hemoglobin: 13.6 g/dL (ref 12.0–15.0)
MCH: 31 pg (ref 26.0–34.0)
MCHC: 34.2 g/dL (ref 30.0–36.0)
MCV: 90.7 fL (ref 80.0–100.0)
Platelets: 183 K/uL (ref 150–400)
RBC: 4.39 MIL/uL (ref 3.87–5.11)
RDW: 13.4 % (ref 11.5–15.5)
WBC: 7.5 K/uL (ref 4.0–10.5)
nRBC: 0 % (ref 0.0–0.2)

## 2023-12-15 LAB — TROPONIN I (HIGH SENSITIVITY): Troponin I (High Sensitivity): 6 ng/L (ref <18)

## 2023-12-15 LAB — VALPROIC ACID LEVEL: Valproic Acid Lvl: <10 ug/mL — ABNORMAL LOW (ref 50–100)

## 2023-12-15 LAB — ETHANOL: Alcohol, Ethyl (B): 275 mg/dL — ABNORMAL HIGH (ref <15)

## 2023-12-15 MED ORDER — DIVALPROEX SODIUM 500 MG PO DR TAB
DELAYED_RELEASE_TABLET | ORAL | 1 refills | Status: DC
  Filled 2023-12-15: qty 90, 30d supply, fill #0

## 2023-12-15 MED ORDER — LORAZEPAM 2 MG/ML IJ SOLN: 2.0000 mg | Freq: Once | INTRAMUSCULAR | Status: DC

## 2023-12-15 MED ORDER — VALPROATE SODIUM 100 MG/ML IV SOLN
500.0000 mg | Freq: Once | INTRAVENOUS | Status: DC
  Filled 2023-12-15: qty 5

## 2023-12-15 MED ORDER — DIVALPROEX SODIUM 500 MG PO DR TAB
1000.0000 mg | DELAYED_RELEASE_TABLET | Freq: Once | ORAL | Status: AC
  Administered 2023-12-15: 1000 mg via ORAL
  Filled 2023-12-15: qty 2

## 2023-12-15 MED ORDER — ONDANSETRON HCL 4 MG/2ML IJ SOLN
4.0000 mg | Freq: Once | INTRAMUSCULAR | Status: AC
  Administered 2023-12-15: 4 mg via INTRAVENOUS
  Filled 2023-12-15: qty 2

## 2023-12-15 MED ORDER — SODIUM CHLORIDE 0.9 % IV BOLUS
1000.0000 mL | Freq: Once | INTRAVENOUS | Status: AC
  Administered 2023-12-15: 1000 mL via INTRAVENOUS

## 2023-12-15 NOTE — ED Triage Notes (Addendum)
 To ER from home via EMS after being called out for difficulty breathing patient reports heart problem. +ETOH. EMS reports some sort of seizure like activity in the back of the ambulance, unable to describe it.  Patient frustrated and shouting during triage. Arrives with a [place] J collar on and she doesn't know why she has it.

## 2023-12-15 NOTE — Discharge Instructions (Signed)
 Please fill your Depakote  prescription tomorrow and begin taking as prescribed.  Please follow-up with your doctor in the next 1 to 2 days for recheck/reevaluation.  Return to the emergency department for any symptom concerning to yourself.

## 2023-12-15 NOTE — ED Provider Notes (Addendum)
 [facility] Provider Note    Event Date/Time   First MD Initiated Contact with Patient 12/15/23 2037     (approximate)  History   Chief Complaint: Alcohol Intoxication  HPI  [name] is a 58 y.o. female with a past medical history of schizophrenia, seizure disorder, substance abuse, presents to the emergency department with complaints of chest pain.  According to the patient she was at home when she states she was complaining of some tightness in her chest and felt like she was having some pain.  She told her significant other per patient who did not believe her, patient states she then got up to go to the bathroom and began having a seizure so she called EMS.  Patient does admit to alcohol use tonight.  Patient states she has not been taking her Depakote  because she ran out of this.  Physical Exam   Triage Vital Signs: ED Triage Vitals [12/15/23 2045]  Encounter Vitals Group     BP 130/79     Girls Systolic BP Percentile      Girls Diastolic BP Percentile      Boys Systolic BP Percentile      Boys Diastolic BP Percentile      Pulse Rate 100     Resp 18     Temp 97.7 F (36.5 C)     Temp Source Oral     SpO2 99 %     Weight      Height      Head Circumference      Peak Flow      Pain Score 0     Pain Loc      Pain Education      Exclude from Growth Chart     Most recent vital signs: Vitals:   12/15/23 2045  BP: 130/79  Pulse: 100  Resp: 18  Temp: 97.7 F (36.5 C)  SpO2: 99%    General: Awake, no distress.  Patient initially agitated however was able to calm down during my evaluation. CV:  Good peripheral perfusion.  Regular rate and rhythm  Resp:  Normal effort.  Equal breath sounds bilaterally.  Abd:  No distention.  Soft, nontender.  No rebound or guarding.  ED Results / Procedures / Treatments   EKG  EKG viewed and interpreted by myself shows a sinus rhythm at 94 bpm the narrow QRS, normal axis, normal intervals, no  concerning ST changes.  Some electrical interference.  RADIOLOGY  I have reviewed interpret the chest x-ray images.  No obvious consolidation on my evaluation. Radiology has read the x-ray is negative for acute disease.   MEDICATIONS ORDERED IN ED: Medications  sodium chloride  0.9 % bolus 1,000 mL (has no administration in time range)  ondansetron  (ZOFRAN ) injection 4 mg (has no administration in time range)     IMPRESSION / MDM / ASSESSMENT AND PLAN / ED COURSE  I reviewed the triage vital signs and the nursing notes.  Patient's presentation is most consistent with acute presentation with potential threat to life or bodily function.  Patient presents emergency department for chest pain.  Patient states has been ongoing for a few days.  Patient was seen in the emergency department 5 days ago, had neck pain at that time with a somewhat suspicious CT scan.  Neurosurgery was consulted and the patient was told to wear [place] J collar while out of bed.  Patient states she has been doing this and she is  currently wearing the collar.  The pain the patient is complaining of is just below where the collar touches the chest.  The collar appears to be appropriately fitting and does not appear to be putting any abnormal amount of pressure on the chest in fact the pad comes off the chest when she is lying in bed.  Given the patient's complaints however we will check labs including CBC chemistry and a troponin.  Will obtain a chest x-ray and EKG.  We will IV hydrate continue to closely monitor while awaiting further results.  Patient is out of Depakote  we will call in a refill of the patient's prescription to the [facility] pharmacy so the patient can pick this up tomorrow.  Will dose the patient's normal Depakote  dose tonight.  Patient's workup tonight shows a reassuring CBC reassuring chemistry negative troponin.  Ethanol level is pending.  Patient got up to use the bathroom and then got back in bed and began  having seizure-like activity.  We examined the patient, she initially would not talk to us  however within a minute or 2 she was back to talking to us , unclear if this was a true seizure or not if it was there was not a significant postictal period.  We will load with 500 mg of IV Depakote , I have added on a Depakote  level as well.  Alcohol level has resulted at 275.  Patient's boyfriend is here with her.  Patient states she is feeling better and is ready to go home.  I have called in a new Depakote  prescription for the patient.  Will discharge home.  FINAL CLINICAL IMPRESSION(S) / ED DIAGNOSES   Chest pain Seizure   Note:  This document was prepared using Dragon voice recognition software and may include unintentional dictation errors.   Dorothyann Drivers, MD 12/15/23 2219    Dorothyann Drivers, MD 12/15/23 2242

## 2023-12-16 ENCOUNTER — Other Ambulatory Visit: Payer: Self-pay

## 2023-12-16 MED ORDER — DIVALPROEX SODIUM 500 MG PO DR TAB: DELAYED_RELEASE_TABLET | ORAL | 0 refills | Status: DC

## 2023-12-16 NOTE — Telephone Encounter (Signed)
 Prescription for Depakote  sent to [facility] in [place]

## 2023-12-17 ENCOUNTER — Other Ambulatory Visit: Payer: Self-pay

## 2023-12-18 ENCOUNTER — Telehealth: Payer: Self-pay

## 2023-12-18 NOTE — Telephone Encounter (Signed)
 Returned pt's call regarding.  Identified the patient using 2 identifiers; pt had a question regarding the letter that was sent with results of Coronary CT Scan.  We went over her upcoming appointments together and what they were pertaining to.  She states that she is still short of breath with activity and wanted to see if there was a medication that could help her.  She has albuterol  listed on her medication list, but she states that the pharmacy hasn't refilled it in a long time.  I suggested that she reach out to her PCP if she needed her prescription renewed.  Pt verbalized understanding and thanked me for calling her back.

## 2023-12-18 NOTE — Telephone Encounter (Signed)
 Pt called in stating she received a letter she doesn't understand and asked to speak with Heron, RN about it. Please advise.

## 2023-12-26 NOTE — Progress Notes (Signed)
 Referring Physician:  Inc, [name] [address] [place],  [place] [address]  Primary Physician:  [name], [name]  History of Present Illness: 01/06/2024 [name] has a history of eczema, bipolar disorder, chronic pain syndrome, cocaine abuse in remission, polysubstance abuse, asthma, depression, MI, schizophrenia, seizures, stroke.   She is not using any drugs currently- she has been sober for > 2 years. Still using alcohol intermittently.   Seen in ED on 12/08/23- she was intoxicated. CT of cervical spine showed degenerative changes with atlantodental interval widening suggestive of instability. [name] recommended she be placed in collar and follow up as outpatient.   She has history of chronic neck pain. She states she was taken to ED because she had a seizure.   She has constant neck pain with radiation into shoulders. Also with constant numbness in right arm to her hand along with tingling in her fingers. She has weakness in her right arm since ED visit. She feels like neck brace is making her pain worse.   She states she did not have any weakness or numbness in right arm until seizure that brought her to ED on 12/08/23.   Tobacco use: she quit smoking 2 months ago.  Bowel/Bladder Dysfunction: none  Conservative measures:  Physical therapy:  has not participated in Multimodal medical therapy including regular antiinflammatories:  Tylenol , Gabapentin  Injections:  09/25/2020- TFESI left L5\S1   Past Surgery: no spine surgery  The symptoms are causing a significant impact on the patient's life.   Review of Systems:  A 10 point review of systems is negative, except for the pertinent positives and negatives detailed in the HPI.  Past Medical History: Past Medical History:  Diagnosis Date   Acute renal failure (ARF) 01/07/2018   Allergy    Anemia    Arthritis    Asthma    NO INHALERS   Bipolar disorder (HCC)    Cocaine abuse  in remission (HCC)    Depression    Dyspnea    Eczema    Heart attack (HCC) 2000   reported by pt   Nervous    Schizophrenia (HCC)    Scoliosis    Seizure (HCC)    Seizures (HCC)    Last seizure, May 20, 2016-PT WAS ALONE-UNSURE LENGTH OF SEIZURE-SCRAPED NOSE AND LIP   Stroke (HCC) 2007   Tobacco use     Past Surgical History: Past Surgical History:  Procedure Laterality Date   APPENDECTOMY  2000   BREAST BIOPSY Right 03/28/2021   right breast stereo ribbon clip-path pending   BREAST CYST EXCISION Bilateral 05/29/2016   Procedure: MASS EXCISION CHEST WALL;  Surgeon: Shelly Pastel, MD;  Location: [facility] ORS;  Service: General;  Laterality: Bilateral;   DILATION AND CURETTAGE OF UTERUS      Allergies: Allergies as of 01/06/2024 - Review Complete 01/06/2024  Allergen Reaction Noted   Flagyl [metronidazole] Other (See Comments) 08/21/2014    Medications: Outpatient Encounter Medications as of 01/06/2024  Medication Sig   albuterol  (VENTOLIN  HFA) 108 (90 Base) MCG/ACT inhaler    divalproex  (DEPAKOTE ) 500 MG DR tablet Take 1 tablet (500 mg total) by mouth every morning AND 2 tablets (1,000 mg total) every evening.   phenytoin  (DILANTIN ) 100 MG ER capsule Take 2 capsules (200 mg total) by mouth at bedtime.   [DISCONTINUED] ACETAMINOPHEN  EXTRA STRENGTH 500 MG tablet Take 500 mg by mouth every 6 (six) hours as needed.   [DISCONTINUED] aspirin  EC 81  MG tablet Take 81 mg by mouth daily. (Patient not taking: Reported on 11/26/2023)   [DISCONTINUED] benzocaine  (AMERICAINE) 20 % rectal ointment Place rectally every 6 (six) hours as needed for pain.   [DISCONTINUED] busPIRone  (BUSPAR ) 7.5 MG tablet Take 1 tablet (7.5 mg total) by mouth 2 (two) times daily.   [DISCONTINUED] gabapentin  (NEURONTIN ) 100 MG capsule Take 1 capsule (100 mg total) by mouth 3 (three) times daily. (Patient not taking: Reported on 11/26/2023)   [DISCONTINUED] metoprolol  tartrate (LOPRESSOR ) 50 MG tablet Take 1  tablet (50 mg total) by mouth once for 1 dose. Take at 1:30 PM on 12/11/2023 PRIOR TO CORONARY CT SCAN   [DISCONTINUED] QUEtiapine  (SEROQUEL ) 100 MG tablet Take 1 tablet (100 mg total) by mouth at bedtime.   [DISCONTINUED] rosuvastatin  (CRESTOR ) 5 MG tablet Take 1 tablet (5 mg total) by mouth daily.   [DISCONTINUED] vitamin B-12 (CYANOCOBALAMIN) 1000 MCG tablet    [DISCONTINUED] VITAMIN D-1000 MAX ST 25 MCG (1000 UT) tablet Take 1,000 Units by mouth daily.   No facility-administered encounter medications on file as of 01/06/2024.    Social History: Social History   Tobacco Use   Smoking status: Every Day    Current packs/day: 0.25    Average packs/day: 0.3 packs/day for 39.0 years (9.8 ttl pk-yrs)    Types: Cigarettes   Smokeless tobacco: Never   Tobacco comments:    Interested in resources   Vaping Use   Vaping status: Never Used  Substance Use Topics   Alcohol use: Yes    Alcohol/week: 6.0 standard drinks of alcohol    Types: 6 Cans of beer per week    Comment: sometimes    Drug use: Yes    Types: Cocaine    Comment: reports 2 years ago    Family Medical History: Family History  Problem Relation Age of Onset   Epilepsy Mother    Seizures Mother    Heart disease Father    Epilepsy Maternal Grandmother    Heart attack Maternal Grandmother     Physical Examination: Vitals:   01/06/24 0946  BP: 106/70    General: Patient is well developed, well nourished, calm, collected, and in no apparent distress. Attention to examination is appropriate.  Respiratory: Patient is breathing without any difficulty.   NEUROLOGICAL:     Awake, alert, oriented to person, place, and time.  Speech is clear and fluent. Fund of knowledge is appropriate.   Cranial Nerves: Pupils equal round and reactive to light.  Facial tone is symmetric.    No posterior cervical tenderness.   She has limited ROM of left shoulder with no pain. She has very limited ROM of right shoulder with pain.    No abnormal lesions on exposed skin.   Strength: Side Biceps Triceps Deltoid Interossei Grip Wrist Ext. Wrist Flex.  R 4 4 4- 4 4- 4- 5  L 5 5 5 5 5 5 5    Side Iliopsoas Quads Hamstring PF DF EHL  R 5 5 5 5 4 4   L 5 5 5 5 5 5    Reflexes are 2+ and symmetric at the biceps, brachioradialis, patella and achilles.   Hoffman's is absent.  Clonus is not present.   Bilateral upper and lower extremity sensation is intact to light touch, but diminished in right arm and right leg.   Gait is slow.   Medical Decision Making  Imaging: Cervical xrays dated 12/09/23:  FINDINGS:   BONES: Imaging is limited to the C1 through C7.  No fracture identified. No aggressive appearing osseous lesion. On the neutral projection images, the atlantoaxial interval measures 3 mm. On the flexion images, this measures 5 mm. On the extension images, this measures 2 mm.   DISCS AND DEGENERATIVE CHANGES: Marked multilevel endplate degenerative changes with disc space narrowing.   SOFT TISSUES: The prevertebral soft tissues appear normal. The visualized lungs appear clear.   IMPRESSION: 1. No acute abnormality of the cervical spine. 2. Dynamic atlantoaxial instability, with atlantoaxial interval widening to 5 mm on flexion. 3. Marked multilevel degenerative changes with disc space narrowing.   Electronically signed by: Waddell Calk MD 12/09/2023 05:48 AM EST RP Workstation: [id number]   CT of cervical spine dated 12/08/23:  BONES AND ALIGNMENT: No acute fracture. Mildly widened atlantodental interval (3 mm).   DEGENERATIVE CHANGES: Severe craniocervical degenerative change.  Partially fused C2-C3. Degenerative disc disease with disc height loss and enplate spurring. Facet and uncovertebral hypertrophy with varying degrees of neural foraminal stenosis.   SOFT TISSUES: No prevertebral soft tissue swelling.   IMPRESSION: 1. No acute fracture in the cervical spine. 2. Severe craniocervical  degenerative change with partially fused C2-C3, and mildly widened atlantodental interval (3 mm) suggestive of atlantoaxial instability.   Electronically signed by: Gilmore Molt MD 12/09/2023 12:57 AM EST RP Workstation: [id number]   I have personally reviewed the images and agree with the above interpretation. Above imaging reviewed with [name] prior to her visit.   Assessment and Plan: Ms. Monjaras has a history of chronic neck pain. She states she was taken to ED because she had a seizure.   She has constant neck pain with radiation into shoulders. Also with constant numbness in right arm to her hand along with tingling in her fingers. She has weakness in her right arm.   She states she did not have any weakness or numbness in right arm until seizure that brought her to ED on 12/08/23.   She has known cervical spondylosis with chronic atlantoaxial instability that is unstable on flex/ext.   She has diffuse weakness in right arm. Also with right shoulder pain and limited ROM- this is likely shoulder mediated.   Treatment options discussed with patient and following plan made:   - MRI of cervical spine ordered to evaluate right arm weakness.  - CTA of head and neck ordered for evaluation. Would need prior to any cervical surgery.  - Discussed with [name], can wear collar for comfort. Does not need to wear if making her pain worse.  - Referral to ortho for right shoulder pain.  - Given prescription for robaxin  to take prn. Reviewed dosing and side effects. She is aware this can make her sleepy.  - At her request, referral to behavioral health to establish care for bipolar. She will need to call them.  - Will plan follow up after I have above results back. May need to see [name]. She is interested in surgery options for her neck.   I spent a total of 45 minutes in face-to-face and non-face-to-face activities related to this patient's care today including review of  outside records, review of imaging, review of symptoms, physical exam, discussion of differential diagnosis, discussion of treatment options, and documentation.   Thank you for involving me in the care of this patient.   Glade Boys PA-C Dept. of Neurosurgery

## 2024-01-06 ENCOUNTER — Ambulatory Visit: Admitting: Orthopedic Surgery

## 2024-01-06 ENCOUNTER — Encounter: Payer: Self-pay | Admitting: Orthopedic Surgery

## 2024-01-06 VITALS — BP 106/70 | Ht <= 58 in | Wt 107.2 lb

## 2024-01-06 DIAGNOSIS — M25511 Pain in right shoulder: Secondary | ICD-10-CM | POA: Diagnosis not present

## 2024-01-06 DIAGNOSIS — R29898 Other symptoms and signs involving the musculoskeletal system: Secondary | ICD-10-CM | POA: Diagnosis not present

## 2024-01-06 DIAGNOSIS — M532X1 Spinal instabilities, occipito-atlanto-axial region: Secondary | ICD-10-CM

## 2024-01-06 DIAGNOSIS — F319 Bipolar disorder, unspecified: Secondary | ICD-10-CM

## 2024-01-06 DIAGNOSIS — M47812 Spondylosis without myelopathy or radiculopathy, cervical region: Secondary | ICD-10-CM | POA: Diagnosis not present

## 2024-01-06 MED ORDER — METHOCARBAMOL 500 MG PO TABS: 500.0000 mg | ORAL_TABLET | Freq: Three times a day (TID) | ORAL | 0 refills | Status: DC | PRN

## 2024-01-06 NOTE — Patient Instructions (Signed)
 It was so nice to see you today. Thank you so much for coming in.    You have wear and tear in your neck (arthritis) with some instability. This has been going on for years and is not new.   If collar makes your pain worse, then you can stop wearing it.   I want to get an MRI of your neck to look into things further. We will get this approved through your insurance and [facility] will call you to schedule the appointment. Ask about your patient responsibility. You do not need to pay this prior to getting MRI, they can bill you.   I also want to get a CTA of your head and neck. This will look at the vessels. [facility] will call you to schedule this as well.    [facility] is located at [address] in [place]. This is near the intersection of [address] and [address]   After you have the MRI and CTA, it can take 14-28 days for me to get the results back. If I don't have them in 2 weeks, we will call to try to get the results.   Once I have the results, we will call you to schedule a follow up visit to review them.   I also sent a prescription for methocarbamol  to help with muscle spasms. Use only as needed and be careful, this can make you sleepy.   I want you to see [facility] here at our clinic for your right shoulder. Dr. Gust and his PA Sam are great and will take good care of you. They should call you to schedule an appointment or you can call them at [phone].   I put in a referral for Behavioral Health. You will need to call them at number below.   Please do not hesitate to call if you have any questions or concerns. You can also message me in MyChart.   Glade Boys PA-C [phone]     The physicians and staff at [facility] Neurosurgery at [facility] are committed to providing excellent care. You may receive a survey asking for feedback about your experience at our office. We value you your feedback and appreciate you taking the time to to fill it  out. The [facility] leadership team is also available to discuss your experience in person, feel free to contact us  [phone].

## 2024-01-12 ENCOUNTER — Encounter: Payer: Self-pay | Admitting: Orthopedic Surgery

## 2024-01-13 ENCOUNTER — Other Ambulatory Visit

## 2024-01-13 ENCOUNTER — Ambulatory Visit

## 2024-01-13 DIAGNOSIS — R0609 Other forms of dyspnea: Secondary | ICD-10-CM

## 2024-01-13 DIAGNOSIS — R079 Chest pain, unspecified: Secondary | ICD-10-CM

## 2024-01-13 LAB — ECHOCARDIOGRAM COMPLETE
AR max vel: 2.21 cm2
AV Area VTI: 2.13 cm2
AV Area mean vel: 2.01 cm2
AV Mean grad: 3 mmHg
AV Peak grad: 4.7 mmHg
Ao pk vel: 1.08 m/s
Area-P 1/2: 3.85 cm2
S' Lateral: 2.43 cm

## 2024-01-14 ENCOUNTER — Ambulatory Visit

## 2024-01-14 DIAGNOSIS — M25511 Pain in right shoulder: Secondary | ICD-10-CM | POA: Diagnosis not present

## 2024-01-14 DIAGNOSIS — M12811 Other specific arthropathies, not elsewhere classified, right shoulder: Secondary | ICD-10-CM | POA: Diagnosis not present

## 2024-01-14 NOTE — Patient Instructions (Signed)

## 2024-01-14 NOTE — Progress Notes (Signed)
 Orthopaedic Surgery New Patient Visit   History of Present Illness: The patient is a 58 y.o. female seen in clinic for 1 month history of right shoulder pain.  Patient states pain is located on the top of shoulder.  Describes as severe, constant ache.  Exacerbated with any movement of right arm.  Reports associated decreased range of motion and weakness.  Associated numbness/tingling on anterior and posterior aspect of right wrist, hand, and all 5 fingers.  Patient does not remember injury/trauma; however, pain began/worsened after seizure episode on 12/08/2023 (patient positive for alcohol and benzodiazepines).  Patient was evaluated at [facility] ED.  Transported via EMS.  Patient with diagnosis of bipolar disorder, schizophrenia, seizures on antiepileptic medication, and polysubstance abuse. Patient also with [facility] ED visit s/p fall in October 2023, right xray at that time revealed high riding humeral head.  Patient with recent referral to [facility]; patient states next available appointment in 3 months.  Patient currently using OTC Tylenol  and ibuprofen  for pain management. Was prescribed Robaxin  last week by neurosurgical team. Patient utilizes wheeled walker for ambulation assistance; not using currently due to break of one wheel.  Patient seen by Glade Boys, PA-C with [facility] at [facility] on 01/06/2024, 1 week ago.  Patient seen in ED on 12/08/2023.  CT of cervical spine showed degenerative changes with atlantodental interval widening suggestive of instability.  Dr. Reeves Nine recommended she be placed in collar and follow-up as outpatient.  Patient has CT angio head and neck and MRI cervical spine scheduled.  Plan, most likely, to schedule cervical spine surgery in the near future. Patient has soft cervical spine collar; but does not wear due to irritation.  Patient currently working at [employer]. Works two days a week.   Past Medical, Social and Family  History: Past Medical History:  Diagnosis Date   Acute renal failure (ARF) 01/07/2018   Allergy    Anemia    Arthritis    Asthma    NO INHALERS   Bipolar disorder (HCC)    Cocaine abuse in remission (HCC)    Depression    Dyspnea    Eczema    Heart attack (HCC) 2000   reported by pt   Nervous    Schizophrenia (HCC)    Scoliosis    Seizure (HCC)    Seizures (HCC)    Last seizure, May 20, 2016-PT WAS ALONE-UNSURE LENGTH OF SEIZURE-SCRAPED NOSE AND LIP   Stroke (HCC) 2007   Tobacco use    Past Surgical History:  Procedure Laterality Date   APPENDECTOMY  2000   BREAST BIOPSY Right 03/28/2021   right breast stereo ribbon clip-path pending   BREAST CYST EXCISION Bilateral 05/29/2016   Procedure: MASS EXCISION CHEST WALL;  Surgeon: Carlin Pastel, MD;  Location: [facility] ORS;  Service: General;  Laterality: Bilateral;   DILATION AND CURETTAGE OF UTERUS     Allergies  Allergen Reactions   Flagyl [Metronidazole] Other (See Comments)    my eyes go back in the back of my head   Current Outpatient Medications on File Prior to Visit  Medication Sig Dispense Refill   albuterol  (VENTOLIN  HFA) 108 (90 Base) MCG/ACT inhaler      divalproex  (DEPAKOTE ) 500 MG DR tablet Take 1 tablet (500 mg total) by mouth every morning AND 2 tablets (1,000 mg total) every evening. 90 tablet 0   methocarbamol  (ROBAXIN ) 500 MG tablet Take 1 tablet (500 mg total) by mouth every 8 (eight) hours as needed for  muscle spasms. 60 tablet 0   phenytoin  (DILANTIN ) 100 MG ER capsule Take 2 capsules (200 mg total) by mouth at bedtime. 60 capsule 1   No current facility-administered medications on file prior to visit.   Social History   Tobacco Use   Smoking status: Every Day    Current packs/day: 0.25    Average packs/day: 0.3 packs/day for 39.0 years (9.8 ttl pk-yrs)    Types: Cigarettes   Smokeless tobacco: Never   Tobacco comments:    Interested in resources   Vaping Use   Vaping status: Never Used   Substance Use Topics   Alcohol use: Yes    Alcohol/week: 6.0 standard drinks of alcohol    Types: 6 Cans of beer per week    Comment: sometimes    Drug use: Yes    Types: Cocaine    Comment: reports 2 years ago      I have reviewed past medical, surgical, social and family history, medications and allergies as documented in the EMR.  Review of Systems - A ROS was performed including pertinent positives and negatives as documented in the HPI.     Physical Exam:  General/Constitutional: NAD Vascular: No edema, swelling or tenderness, except as noted in detailed exam Integumentary: No impressive skin lesions present, except as noted in detailed exam Neuro/Psych: Normal mood and affect, oriented to person, place and time Musculoskeletal: Normal, except as noted in detailed exam and in HPI   Focused Orthopaedic Examination:  Neck focused exam: Palpation: Mild midline posterior cervical tenderness. No tenderness with palpation over paraspinal cervical musculature. ROM: diffusely limited ROM with flexion, extension, rotation and side-bending   Shoulder focused exam:  Skin is intact about the right shoulder.  No obvious skin breakdown or deformity.  Shoulder examination extremely limited secondary to patient participation    RIGHT  Scapula Atrophy Negative   Winging Negative  Rotator cuff Supraspinatus 4+/5   Infraspinatus 4-/5   Subscapularis 4+/5  AROM/PROM (degrees) FF 0-60 / 0-130   ER0 0-40 / 0-40   IR(back) Back pocket  Palpation (pain): AC positive   Biceps positive   Coracoid negative  Special Tests: O'Briens negative   Mayo-shear Not performed   Cross body Adduction negative   Speeds  negative   Yergasons Positive for pain   Jobe's Positive for pain and weakness   Neer Positive for pain   Hawkins Positive for pain   Belly Press positive   Hornblower's positive  Instability: Apprehension & relocation Not performed   Load & shift Not performed     Anterior Not performed    Posterior Not performed  Other: Sulcus sign positive   Lateral deltoid 4+/5    Vascular/Lymphatic: Fingers warm and well perfused with 2+ radial pulse.    Neurologic: Sensation intact to the Median, Ulnar and Radial nerve distribution of the hand. Sensation intact to lateral deltoid (axillary nerve) however subjectively diminished diffusely in the hand.  Patient with subjective decreased sensation over dorsal and palmar aspect of right wrist, hand, and all five fingers. Patient able to make composite fist, however, very slowly. 4+/5 hand grip strength.     XR Right Shoulder Imaging: X-rays of the Right Shoulder including 4 views (AP, grashey, y-view, axillary) obtained today 01/14/2024 were reviewed personally by me.  Per my independent interpretation these images show no acute fracture or dislocation.  High riding humeral head noted with posterior subluxation of the humeral head on the glenoid rim.  Cyst formation noted about  the humeral head.  Inferior osteophyte noted about the humeral head.  Moderate degenerative changes noted about the glenoid with loss of posterior concavity.  Mild degenerative changes noted about the acromioclavicular joint.  In comparison to radiographs performed on 11/18/2021, high riding humeral head noted on these images as well.    Radiology Read: Cervical Spine Xray on 12/09/2023 IMPRESSION: 1. No acute abnormality of the cervical spine. 2. Dynamic atlantoaxial instability, with atlantoaxial interval widening to 5 mm on flexion. 3. Marked multilevel degenerative changes with disc space narrowing.  CT of Cervical Spine on 12/09/2023 IMPRESSION: 1. No acute fracture in the cervical spine. 2. Severe craniocervical degenerative change with partially fused C2-C3, and mildly widened atlantodental interval (3 mm) suggestive of atlantoaxial instability.    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 We reviewed patient's history, examination, and imaging in detail. Based on information available for this encounter, patient with 1 month history of right shoulder pain, loss of range of motion and weakness.  Symptoms began s/p seizure with possible associated fall per patient.  Patient on physical exam with significant guarding making evaluation difficult.  However very limited active motion present as well as decreased strength in testing of the rotator cuff.  Right shoulder radiographs performed in office today reveals evidence of rotator cuff arthropathy (present on previous right shoulder xray in October 2023, though worsening/becoming more severe) with high riding humeral head and glenohumeral arthritis.  Patient with concomitant atlantoaxial instability, being worked up by [facility] with plan for potential surgical intervention.  Discussed today's findings with patient.  Recommended formal course of physical therapy for the right shoulder to help increase range of motion and strength.  Offered subacromial corticosteroid injection for therapeutic purposes.  Discussed risks and benefits.  Patient requested injection.  Performed in office today and patient tolerated procedure well.  Physical therapy referral placed, via home health services, because patient has difficulty making appointments due to inability to drive due to seizure disorder. We have not scheduled formal follow-up with patient, recommended patient focus on cervical spine management at this time, and notify [facility] office/follow-up after cervical spine addressed, under guidance of neurosurgery.   Patient education material was provided.  All questions, concerns and comments were addressed to the best of my ability.  Follow-up: prn after cervical spine management completed  I discussed with the patient today  that I will be transitioning out of my role within the near future. In order to provide appropriate continuity of care, we offered the patient options for follow up regarding their orthopedic concerns. Patient has chosen to follow up with [facility].  Patient may reach out to our office if there are any difficulties in scheduling follow up care. The patient understands who to contact for future orthopedic concerns and has contact information for the receiving practice.    Arlyss GEANNIE Schneider, DO Orthopedic Surgery & Sports Medicine [place]   This document was dictated using Dragon voice recognition software. A reasonable attempt at proof reading has been made to minimize errors.

## 2024-01-15 ENCOUNTER — Inpatient Hospital Stay
Admission: RE | Admit: 2024-01-15 | Discharge: 2024-01-15 | Attending: Orthopedic Surgery | Admitting: Orthopedic Surgery

## 2024-01-15 DIAGNOSIS — M532X1 Spinal instabilities, occipito-atlanto-axial region: Secondary | ICD-10-CM

## 2024-01-15 DIAGNOSIS — M47812 Spondylosis without myelopathy or radiculopathy, cervical region: Secondary | ICD-10-CM

## 2024-01-15 DIAGNOSIS — R29898 Other symptoms and signs involving the musculoskeletal system: Secondary | ICD-10-CM

## 2024-01-15 MED ORDER — IOPAMIDOL (ISOVUE-370) INJECTION 76%
75.0000 mL | Freq: Once | INTRAVENOUS | Status: AC | PRN
  Administered 2024-01-15: 12:00:00 75 mL via INTRAVENOUS

## 2024-01-16 ENCOUNTER — Telehealth: Payer: Self-pay | Admitting: Orthopedic Surgery

## 2024-01-16 NOTE — Telephone Encounter (Addendum)
 She can try increasing the robaxin  to 1 every 6 hours as needed.   Let us  know if that doesn't help. She may also want to discuss further medications with PCP or neurology. A lot of the medications interact with her dilantin  and depakote .

## 2024-01-16 NOTE — Telephone Encounter (Signed)
 Pt called in to f/u on pain meds being sent to the pharmacy. Adv pt we are still waiting on a response from [facility]

## 2024-01-16 NOTE — Telephone Encounter (Signed)
 Patient is calling to let our office know that she is scheduled for her MRI on 01/21/2024 but that she is in a lot of pain, rating it at a 9/10. She states that this has gotten worse since she saw Glade and would like to know what she can do to help with the pain.

## 2024-01-16 NOTE — Telephone Encounter (Signed)
 Spoke with patient. Patient is still having bilateral constant neck pain that radiates to her shoulders as she did during her visit. Numbness in the right arm and hand is the same, weakness in the right arm is the same. No new weakness or numbness symptoms. Pain in her neck to the shoulders is more intense. She is taking Robaxin  500 mg 1 every 8 hours-no relief. Takes Tylenol  (she thinks its 200 mg tablets-did not have the bottle with her) she take 2 to 3 tablets 3 to 4 times a day. No other medications. This regimen and medications are not helping

## 2024-01-16 NOTE — Telephone Encounter (Addendum)
 Spoke with pharmacist at [facility] about robaxin  and dilantin /depakote . No contraindications. Pharmacist also said that baclofen also would not interact with dilantin /depakote .

## 2024-01-16 NOTE — Telephone Encounter (Signed)
 Patient notified and verbalized understanding.

## 2024-01-21 ENCOUNTER — Ambulatory Visit
Admission: RE | Admit: 2024-01-21 | Discharge: 2024-01-21 | Disposition: A | Discharge status: Home / Self Care | Source: Ambulatory Visit | Attending: Orthopedic Surgery | Admitting: Orthopedic Surgery

## 2024-01-21 DIAGNOSIS — M47812 Spondylosis without myelopathy or radiculopathy, cervical region: Secondary | ICD-10-CM

## 2024-01-21 DIAGNOSIS — M532X1 Spinal instabilities, occipito-atlanto-axial region: Secondary | ICD-10-CM

## 2024-01-21 DIAGNOSIS — R29898 Other symptoms and signs involving the musculoskeletal system: Secondary | ICD-10-CM

## 2024-01-22 ENCOUNTER — Telehealth: Payer: Self-pay | Admitting: Orthopedic Surgery

## 2024-01-22 DIAGNOSIS — M47812 Spondylosis without myelopathy or radiculopathy, cervical region: Secondary | ICD-10-CM

## 2024-01-22 DIAGNOSIS — M532X1 Spinal instabilities, occipito-atlanto-axial region: Secondary | ICD-10-CM

## 2024-01-22 DIAGNOSIS — R29898 Other symptoms and signs involving the musculoskeletal system: Secondary | ICD-10-CM

## 2024-01-22 MED ORDER — BACLOFEN 10 MG PO TABS: 10.0000 mg | ORAL_TABLET | Freq: Three times a day (TID) | ORAL | 0 refills | Status: DC | PRN

## 2024-01-22 NOTE — Telephone Encounter (Signed)
 There is a phone note from 12/12 about her pain also and we advised her to increase Robaxin  at that time, other medications she is taking are in that note. Nothing else changed with medications

## 2024-01-22 NOTE — Telephone Encounter (Signed)
 Patient is calling to let our office know that she had her MRI done yesterday and that she took two of the Methocarbamol  and still was not able to get much relief. She would like to know what she needs to do for her pain. She does state that she has to be careful with her medications due to her having seizures.

## 2024-01-22 NOTE — Telephone Encounter (Signed)
 Discussed again with pharmacist at [facility] outpatient pharmacy.   Can change to baclofen  and this should not interact with depakote  or dilantin .   Advise patient to stop robaxin  and start baclofen . Remind her that the baclofen  can make her sleepy. She should only take as needed.

## 2024-01-22 NOTE — Addendum Note (Signed)
 Addended by: HILMA HASTINGS on: 01/22/2024 12:40 PM   Modules accepted: Orders

## 2024-02-01 ENCOUNTER — Emergency Department
Admission: EM | Admit: 2024-02-01 | Discharge: 2024-02-01 | Disposition: A | Discharge status: Home / Self Care | Attending: Emergency Medicine | Admitting: Emergency Medicine

## 2024-02-01 ENCOUNTER — Other Ambulatory Visit: Payer: Self-pay

## 2024-02-01 ENCOUNTER — Encounter: Payer: Self-pay | Admitting: Emergency Medicine

## 2024-02-01 DIAGNOSIS — M542 Cervicalgia: Secondary | ICD-10-CM | POA: Insufficient documentation

## 2024-02-01 LAB — CBC WITH DIFFERENTIAL/PLATELET
Abs Immature Granulocytes: 0.01 K/uL (ref 0.00–0.07)
Basophils Absolute: 0 K/uL (ref 0.0–0.1)
Basophils Relative: 1 %
Eosinophils Absolute: 0 K/uL (ref 0.0–0.5)
Eosinophils Relative: 1 %
HCT: 39.1 % (ref 36.0–46.0)
Hemoglobin: 13.2 g/dL (ref 12.0–15.0)
Immature Granulocytes: 0 %
Lymphocytes Relative: 44 %
Lymphs Abs: 2.5 K/uL (ref 0.7–4.0)
MCH: 31.1 pg (ref 26.0–34.0)
MCHC: 33.8 g/dL (ref 30.0–36.0)
MCV: 92.2 fL (ref 80.0–100.0)
Monocytes Absolute: 0.4 K/uL (ref 0.1–1.0)
Monocytes Relative: 7 %
Neutro Abs: 2.8 K/uL (ref 1.7–7.7)
Neutrophils Relative %: 47 %
Platelets: 165 K/uL (ref 150–400)
RBC: 4.24 MIL/uL (ref 3.87–5.11)
RDW: 13.6 % (ref 11.5–15.5)
WBC: 5.7 K/uL (ref 4.0–10.5)
nRBC: 0 % (ref 0.0–0.2)

## 2024-02-01 LAB — PHENYTOIN LEVEL, TOTAL: Phenytoin Lvl: <2.5 ug/mL — ABNORMAL LOW (ref 10.0–20.0)

## 2024-02-01 LAB — VALPROIC ACID LEVEL: Valproic Acid Lvl: <10 ug/mL — ABNORMAL LOW (ref 50–100)

## 2024-02-01 MED ORDER — DIVALPROEX SODIUM 500 MG PO DR TAB: DELAYED_RELEASE_TABLET | ORAL | 0 refills | Status: AC

## 2024-02-01 MED ORDER — GABAPENTIN 100 MG PO CAPS
100.0000 mg | ORAL_CAPSULE | Freq: Once | ORAL | Status: AC
  Administered 2024-02-01: 100 mg via ORAL
  Filled 2024-02-01: qty 1

## 2024-02-01 MED ORDER — PHENYTOIN SODIUM EXTENDED 100 MG PO CAPS: 200.0000 mg | ORAL_CAPSULE | Freq: Every day | ORAL | 0 refills | Status: AC

## 2024-02-01 MED ORDER — GABAPENTIN 100 MG PO CAPS: 100.0000 mg | ORAL_CAPSULE | Freq: Three times a day (TID) | ORAL | 2 refills | Status: AC

## 2024-02-01 MED ORDER — PHENYTOIN SODIUM EXTENDED 100 MG PO CAPS
200.0000 mg | ORAL_CAPSULE | Freq: Once | ORAL | Status: AC
  Administered 2024-02-01: 200 mg via ORAL
  Filled 2024-02-01: qty 2

## 2024-02-01 NOTE — Discharge Instructions (Addendum)
 Follow up with Dr. Katrina this week as scheduled.  I have prescribed you a 1 month supply of both of your seizure medications you can start back on them.  I have also prescribed gabapentin  for you to start taking again, 3 times daily.  Return to the ER for any new or worsening pain, weakness or numbness, recurrent seizures, or any other new or worsening symptoms that concern you.

## 2024-02-01 NOTE — ED Provider Notes (Signed)
 "  [facility] Provider Note    Event Date/Time   First MD Initiated Contact with Patient 02/01/24 2004     (approximate)   History   Shoulder Pain   HPI  CONSUELA WIDENER is a 58 y.o. female with a history of schizophrenia, seizure disorder, and substance abuse who presents with persistent neck pain primarily as well as some right shoulder pain.  She has a history of spine and shoulder problems and is being followed by neurosurgery and orthopedics.  She has a follow-up appointment scheduled with Dr. Katrina from neurosurgery this week, but feels like she cannot manage the pain.  She has been on Robaxin  and baclofen  with no significant relief.  She used to take gabapentin  although is not on it currently.  The patient reports chronic numbness in the right hand but no new numbness.  She has no weakness.  She denies any recent trauma.    In the waiting room the patient had an apparent generalized seizure.  However after being brought back to the room she is alert and oriented and without complaint.  She states that she has been off of her Depakote  and gabapentin  for about 3 weeks since she did not receive them from the pharmacy.  I reviewed the past medical records.  The patient's most recent outpatient encounter was with orthopedics on 12/10 for evaluation of shoulder pain, along with loss of range of motion or weakness.  The patient had x-rays and received a subacromial corticosteroid injection.  She was also evaluated within the last month by neurosurgery.  MRI obtained on 12/17 shows multilevel foraminal stenosis.  She was on Robaxin  and was switched to baclofen .  Physical Exam   Triage Vital Signs: ED Triage Vitals  Encounter Vitals Group     BP 02/01/24 2005 (!) 146/75     Girls Systolic BP Percentile --      Girls Diastolic BP Percentile --      Boys Systolic BP Percentile --      Boys Diastolic BP Percentile --      Pulse Rate 02/01/24 2005 88     Resp  --      Temp 02/01/24 2005 97.8 F (36.6 C)     Temp Source 02/01/24 2005 Oral     SpO2 02/01/24 2005 99 %     Weight 02/01/24 2004 105 lb 13.1 oz (48 kg)     Height 02/01/24 2004 4' 5 (1.346 m)     Head Circumference --      Peak Flow --      Pain Score 02/01/24 2004 9     Pain Loc --      Pain Education --      Exclude from Growth Chart --     Most recent vital signs: Vitals:   02/01/24 2145 02/01/24 2300  BP: 115/74 110/66  Pulse: (!) 101 97  Resp: 20 20  Temp:    SpO2: 96% 97%     General: Alert and oriented, no distress.  CV:  Good peripheral perfusion.  Resp:  Normal effort.  Abd:  No distention.  Other:  EOMI.  PERRLA.  No facial droop.  Motor intact in all extremities.  5/5 motor strength proximally and distally to the right upper extremity.  Subjective decrease sensation to the right hand which the patient reports is chronic.  No midline spinal tenderness.  Neck with decreased ROM which the patient also reports is chronic.   ED Results /  Procedures / Treatments   Labs (all labs ordered are listed, but only abnormal results are displayed) Labs Reviewed  VALPROIC  ACID LEVEL - Abnormal; Notable for the following components:      Result Value   Valproic  Acid Lvl <10 (*)    All other components within normal limits  PHENYTOIN  LEVEL, TOTAL - Abnormal; Notable for the following components:   Phenytoin  Lvl <2.5 (*)    All other components within normal limits  CBC WITH DIFFERENTIAL/PLATELET  BASIC METABOLIC PANEL WITH GFR     EKG  ED ECG REPORT I, Waylon Cassis, the attending physician, personally viewed and interpreted this ECG.  Date: 02/01/2024 EKG Time: 1954 Rate: 86 Rhythm: normal sinus rhythm QRS Axis: Borderline right axis Intervals: normal ST/T Wave abnormalities: normal Narrative Interpretation: no evidence of acute ischemia    RADIOLOGY    PROCEDURES:  Critical Care performed: No  Procedures   MEDICATIONS ORDERED IN  ED: Medications  phenytoin  (DILANTIN ) ER capsule 200 mg (200 mg Oral Given 02/01/24 2145)  gabapentin  (NEURONTIN ) capsule 100 mg (100 mg Oral Given 02/01/24 2306)     IMPRESSION / MDM / ASSESSMENT AND PLAN / ED COURSE  I reviewed the triage vital signs and the nursing notes.  58 year old female with PMH as noted above presents primarily with persistent neck and right shoulder pain not relieved by medications prescribed by her outpatient providers.  She has neurosurgery follow-up scheduled for this week.  On exam she has no acute neurologic deficits.  She reports numbness which is chronic.  In addition she had a seizure while in the waiting room.  She has been off of her seizure medications for the last 3 weeks.  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  There is no indication for additional imaging today.  The patient has no neurodeficits and had no trauma.  I consulted and discussed the case with Dr. Clois who recommended increasing the patient's gabapentin , although turns out the patient had actually stopped her gabapentin  sometime ago so I will restart it.  Seizure: The patient likely had a breakthrough epileptic seizure due to not being on her Dilantin  or Depakote .  We will obtain lab workup, give the patient a dose of Dilantin  here, and I will represcribe the Dilantin  and Depakote .  Patient's presentation is most consistent with acute complicated illness / injury requiring diagnostic workup.  ----------------------------------------- 11:07 PM on 02/01/2024 -----------------------------------------  CBC is unremarkable.  Dilantin  and valproic  acid levels are low consistent with the patient not having taken them.  The BMP hemolyzed.  However, at this point the patient has not had any recurrent seizures and is otherwise asymptomatic.  The breakthrough seizures  almost certainly because of medication noncompliance, so there is no indication for repeat.  The patient is stable for discharge at this time.  She is eager to go home.  I counseled her on the plan of care and on return precautions; she expressed understanding and agreement.   FINAL CLINICAL IMPRESSION(S) / ED DIAGNOSES   Final diagnoses:  Neck pain     Rx / DC Orders   ED Discharge Orders          Ordered    divalproex  (DEPAKOTE ) 500 MG DR tablet  Multiple Frequencies        02/01/24 2303    phenytoin  (DILANTIN ) 100 MG ER capsule  Daily at bedtime  02/01/24 2303    gabapentin  (NEURONTIN ) 100 MG capsule  3 times daily        02/01/24 2303             Note:  This document was prepared using Dragon voice recognition software and may include unintentional dictation errors.    Jacolyn Pae, MD 02/01/24 2328  "

## 2024-02-01 NOTE — ED Triage Notes (Signed)
 Pt to ED via [facility], pt states hx of shoulder pain due to needing shoulder surgery. Pt states neck pain that has started over the last few days. Pt states she can't move her neck, has appt with Dr. Katrina on Tuesday for the neck pain.   Pt has hx of seizures, upon arrival to lobby where paitent initially went, pt had witness seizure-like activity lasting approx 1 min. Pt placed on stretcher and brought to room 12 for further eval.   Upon this RN entering room and interacting with patient, she is now A&O x4, able to answer questions. This RN confirmed hx of seizures, pt states has not been taking seizures medication.

## 2024-02-02 NOTE — Progress Notes (Signed)
 "   Referring Physician:  Inc, [facility] [address] [place],  [place] [address]  Primary Physician:  Inc, [facility]  History of Present Illness: 02/10/2024 [name] is here today with a chief complaint of cervical spondylosis with myelopathy and radiculopathy who presents for evaluation of severe neck pain and neurological symptoms.  She has persistent severe posterior neck pain radiating to the right shoulder that is most bothersome, disrupts sleep, and limits daily activities. She has no pain in the shoulder blades. She has not started physical therapy despite requesting a referral.  She has numbness and paresthesia of the entire right foot and right hand. She cannot keep her leg elevated for more than five minutes, and crossing her legs causes complete right foot numbness. She has no numbness or pain in the left hand, left foot, or left upper extremity, and no extremity weakness, frequent falls, or balance problems.  She has a seizure disorder with about six episodes in the past year, including a recent nocturnal seizure associated with severe neck pain that led to an emergency department visit. She does not recall the event. She is taking antiepileptic medication from the emergency department but has had adherence issues related to Medicaid coverage, feels persistently fatigued, and is unsure of her exact regimen.  She drinks alcohol daily, typically three beers, with occasional higher intake depending on work.  She smokes cigarettes but has reduced use, sometimes only taking a puff before discarding the cigarette, after resuming smoking due to workplace exposure.  Discussed the use of AI scribe software for clinical note transcription with the patient, who gave verbal consent to proceed.   Progress Note from Glade Boys, [place] on 01/06/24:  History of Present Illness: 01/06/2024 [name] has a history of eczema, bipolar disorder, chronic  pain syndrome, cocaine abuse in remission, polysubstance abuse, asthma, depression, MI, schizophrenia, seizures, stroke.    She is not using any drugs currently- she has been sober for > 2 years. Still using alcohol intermittently.    Seen in ED on 12/08/23- she was intoxicated. CT of cervical spine showed degenerative changes with atlantodental interval widening suggestive of instability. Dr. Clois recommended she be placed in collar and follow up as outpatient.    She has history of chronic neck pain. She states she was taken to ED because she had a seizure.    She has constant neck pain with radiation into shoulders. Also with constant numbness in right arm to her hand along with tingling in her fingers. She has weakness in her right arm since ED visit. She feels like neck brace is making her pain worse.    She states she did not have any weakness or numbness in right arm until seizure that brought her to ED on 12/08/23.    Tobacco use: she quit smoking 2 months ago.   Bowel/Bladder Dysfunction: none   Conservative measures:  Physical therapy:  has not participated in Multimodal medical therapy including regular antiinflammatories:  Tylenol , Gabapentin  Injections:  09/25/2020- Left L5-S1 TF ESI   Past Surgery: no spine surgery  Review of Systems:  A 10 point review of systems is negative, except for the pertinent positives and negatives detailed in the HPI.  Past Medical History: Past Medical History:  Diagnosis Date   Acute renal failure (ARF) 01/07/2018   Allergy    Anemia    Arthritis    Asthma    NO INHALERS   Bipolar disorder (HCC)  Cocaine abuse in remission (HCC)    Depression    Dyspnea    Eczema    Heart attack (HCC) 2000   reported by pt   Nervous    Schizophrenia (HCC)    Scoliosis    Seizure (HCC)    Seizures (HCC)    Last seizure, May 20, 2016-PT WAS ALONE-UNSURE LENGTH OF SEIZURE-SCRAPED NOSE AND LIP   Stroke (HCC) 2007   Tobacco use     Past  Surgical History: Past Surgical History:  Procedure Laterality Date   APPENDECTOMY  2000   BREAST BIOPSY Right 03/28/2021   right breast stereo ribbon clip-path pending   BREAST CYST EXCISION Bilateral 05/29/2016   Procedure: MASS EXCISION CHEST WALL;  Surgeon: Carlin Pastel, MD;  Location: [facility] ORS;  Service: General;  Laterality: Bilateral;   DILATION AND CURETTAGE OF UTERUS      Allergies: Allergies as of 02/10/2024 - Review Complete 02/10/2024  Allergen Reaction Noted   Flagyl [metronidazole] Other (See Comments) 08/21/2014    Medications: Current Medications[1]  Social History: Social History[2]  Family Medical History: Family History  Problem Relation Age of Onset   Epilepsy Mother    Seizures Mother    Heart disease Father    Epilepsy Maternal Grandmother    Heart attack Maternal Grandmother     Physical Examination: Vitals:   02/10/24 0951  BP: 136/82    General: Patient is in no apparent distress. Attention to examination is appropriate.  Neck:   Supple.  Limited range of motion.  Respiratory: Patient is breathing without any difficulty.   NEUROLOGICAL:     Awake, alert, oriented to person, place, and time.  Speech is clear and fluent.   Cranial Nerves: Pupils equal round and reactive to light.  Facial tone is symmetric.  Facial sensation is symmetric. Shoulder shrug is symmetric. Tongue protrusion is midline.  There is no pronator drift.  Strength: Side Biceps Triceps Deltoid Interossei Grip Wrist Ext. Wrist Flex.  R 4+ 4+ 4 4 4- 4 4  L 5 5 5 5 5 5 5    Side Iliopsoas Quads Hamstring PF DF EHL  R 5 5 5 5 5 5   L 5 5 5 5 5 5    Reflexes are 2+ and symmetric at the biceps, triceps, brachioradialis, patella and achilles.   Hoffman's is absent.   Bilateral upper and lower extremity sensation is intact to light touch except R peroneal distribution and R dorsum of hand which are diminished.    No evidence of dysmetria noted.  Gait is normal.      Medical Decision Making  Imaging: MRI C spine 01/21/2024 IMPRESSION: 1. Multilevel severe foraminal stenosis, including severe right-sided foraminal stenosis at C4-5 and C5-6, and severe bilateral foraminal stenosis at C6-7. 2. Canal narrowing his greatest at C3-4, measuring 6.5 mm.   Electronically signed by: Lonni Necessary MD 01/25/2024 04:17 PM EST RP Workstation: [id number]  C spine xray 12/09/2023 IMPRESSION: 1. No acute abnormality of the cervical spine. 2. Dynamic atlantoaxial instability, with atlantoaxial interval widening to 5 mm on flexion. 3. Marked multilevel degenerative changes with disc space narrowing.   Electronically signed by: Waddell Calk MD 12/09/2023 05:48 AM EST RP Workstation: [id number]  I have personally reviewed the images and agree with the above interpretation.  Assessment and Plan: Ms. Tarnow is a pleasant 58 y.o. female with some symptoms of cervical myelopathy and cervical radiculopathy.  She has subjective weakness in her right arm.  She has neck pain.  This  is due to cervical stenosis.  She also has some evidence of atlantoaxial instability.  She is a daily drinker.  She may be a candidate for surgical intervention, but I would like her to decrease her alcohol intake to 1 beer a day or less.  Additionally, the current laboratory evaluation that is available suggest that she is noncompliant with her medications for her seizure disorder.  I reinforced that taking her seizure medications is important.  She expressed that she had been taking her seizure medications.  I also expressed that excessive alcohol intake is detrimental to the health of someone who is suffering from seizures.  She expressed understanding.  I will see her back in 6 weeks.  We will test her antiepileptic medication levels at that time.    I spent a total of 30 minutes in this patient's care today. This time was spent reviewing pertinent records including imaging  studies, obtaining and confirming history, performing a directed evaluation, formulating and discussing my recommendations, and documenting the visit within the medical record.      Thank you for involving me in the care of this patient.      Teauna Dubach K. Clois MD, [facility] Neurosurgery     [1]  Current Outpatient Medications:    divalproex  (DEPAKOTE ) 500 MG DR tablet, Take 1 tablet (500 mg total) by mouth every morning AND 2 tablets (1,000 mg total) every evening., Disp: 90 tablet, Rfl: 0   gabapentin  (NEURONTIN ) 100 MG capsule, Take 1 capsule (100 mg total) by mouth 3 (three) times daily., Disp: 90 capsule, Rfl: 2   phenytoin  (DILANTIN ) 100 MG ER capsule, Take 2 capsules (200 mg total) by mouth at bedtime., Disp: 60 capsule, Rfl: 0 [2]  Social History Tobacco Use   Smoking status: Every Day    Current packs/day: 0.25    Average packs/day: 0.3 packs/day for 39.0 years (9.8 ttl pk-yrs)    Types: Cigarettes   Smokeless tobacco: Never   Tobacco comments:    Interested in resources   Vaping Use   Vaping status: Never Used  Substance Use Topics   Alcohol use: Yes    Alcohol/week: 6.0 standard drinks of alcohol    Types: 6 Cans of beer per week    Comment: sometimes    Drug use: Yes    Types: Cocaine    Comment: reports 2 years ago   "

## 2024-02-06 ENCOUNTER — Telehealth: Payer: Self-pay | Admitting: Neurosurgery

## 2024-02-06 NOTE — Telephone Encounter (Signed)
 Spoke to patient, she stated she will try that.

## 2024-02-06 NOTE — Telephone Encounter (Signed)
 Pt is having pains and nothing is working. Pt called in crying and is in severe pain. They are wanting to know what they can do to ease the pain asap until their Tuesday appt. They are taking gabepentin.

## 2024-02-06 NOTE — Telephone Encounter (Signed)
 Reviewed with pharmacist at [facility]- no absolute contraindications with depakote /dilantin  and neurontin .   Is she taking neurontin  100mg  tid? If no side effects (feeling sleepy, dizzy, or drunk) then I recommend increasing to 200mg  at night.   She should take 100mg  in am, 100mg  at lunch, and 200mg  at night. She should go back to 100mg  tid if any increased side effects.   Hopefully this will help with her pain.

## 2024-02-10 ENCOUNTER — Ambulatory Visit: Admitting: Neurosurgery

## 2024-02-10 ENCOUNTER — Encounter: Payer: Self-pay | Admitting: Neurosurgery

## 2024-02-10 VITALS — BP 136/82 | Ht 60.0 in | Wt 105.0 lb

## 2024-02-10 DIAGNOSIS — R29898 Other symptoms and signs involving the musculoskeletal system: Secondary | ICD-10-CM

## 2024-02-10 DIAGNOSIS — R569 Unspecified convulsions: Secondary | ICD-10-CM | POA: Diagnosis not present

## 2024-02-10 DIAGNOSIS — M5412 Radiculopathy, cervical region: Secondary | ICD-10-CM

## 2024-02-10 DIAGNOSIS — G959 Disease of spinal cord, unspecified: Secondary | ICD-10-CM | POA: Diagnosis not present

## 2024-02-10 DIAGNOSIS — M4802 Spinal stenosis, cervical region: Secondary | ICD-10-CM | POA: Diagnosis not present

## 2024-02-10 DIAGNOSIS — F1910 Other psychoactive substance abuse, uncomplicated: Secondary | ICD-10-CM | POA: Diagnosis not present

## 2024-02-10 DIAGNOSIS — M532X1 Spinal instabilities, occipito-atlanto-axial region: Secondary | ICD-10-CM

## 2024-02-25 NOTE — Progress Notes (Signed)
 This encounter was created in error - please disregard.

## 2024-02-25 NOTE — Progress Notes (Unsigned)
" °  Cardiology Office Note   Date:  02/25/2024  ID:  [name], DOB 12/29/1965, MRN [id number] PCP: Inc, [facility]  [facility] Providers Cardiologist:  Caron Poser, MD     History of Present Illness [name] is a 59 y.o. female PMH polysubstance abuse, alcohol dependence, seizure disorder, reported history of MI who presents for further management of DOE.  Patient reports dyspnea with exertion.  She says that she has about 20-25 stairs leading up to her apartment and she has significant dyspnea with this climb every day.  She denies orthopnea or LE edema.  She also reports chest discomfort which sometimes happen as at rest, sometimes with exertion.  She says that she no longer smokes but was a longtime smoker in the past.  Also reports a history of crack cocaine use.  She refused ECG in office today. Last LDL 57 08/2017.  Interval history: Since last visit, we obtained a coronary CTA which was CAD RADS 2.  We also obtained an echocardiogram which was essentially normal.***  Relevant CVD History -TTE 01/2024 LVEF 55 to 60%, normal RV size and function, mild MR, trivial AI -Coronary CTA 12/2023 CAD RADS 2 with mild stenosis proximal to mid RCA, proximal LAD, proximal Lcx.  CAC score 593, 99th percentile. -CT chest 06/2020 three-vessel CAC with aortic atherosclerosis -TTE 08/2017 normal biventricular function without significant valvular disease -Carotid Doppler 08/2017 with mild plaque and less than 50% stenosis bilaterally   ROS: Pt denies any chest discomfort, jaw pain, arm pain, palpitations, syncope, presyncope, orthopnea, PND, or LE edema.  Studies Reviewed I have independently reviewed the patient's ECG, previous cardiac testing, recent medical records.  Physical Exam VS:  There were no vitals taken for this visit.       Wt Readings from Last 3 Encounters:  02/10/24 105 lb (47.6 kg)  02/01/24 105 lb 13.1 oz (48 kg)  01/06/24 107 lb 3.2 oz  (48.6 kg)    GEN: No acute distress. NECK: No JVD; No carotid bruits. CARDIAC: RRR, no murmurs, rubs, gallops. RESPIRATORY:  Clear to auscultation. EXTREMITIES:  Warm and well-perfused. No edema.  ASSESSMENT AND PLAN Chest discomfort DOE History of cocaine abuse Patient reports undifferentiated dyspnea and chest discomfort.  We obtained a coronary CT angiogram 12/2023 which showed only mild disease with CAD RADS 2.  We also obtained an echocardiogram 01/2024 which were essentially normal.  Seems like this is most likely noncardiac, she almost certainly has underlying COPD, so it could be coming from this.   Plan: - No further cardiac workup or treatment indicated - Advised continued drug abstinence, especially cocaine - Consider COPD therapies with PCP if indicated  Reported History of MI Non-obstructive CAD HLD Aortic atherosclerosis Mild CAD seen on last CT angiogram as above.  Aortic atherosclerosis present as well.  Last LDL 50 in 11/2023.  Plan: - Continue ASA 81 mg daily - Continue Crestor  5 mg daily        Dispo: RTC 1 year or sooner as needed  Signed, Caron Poser, MD  "

## 2024-02-26 ENCOUNTER — Ambulatory Visit

## 2024-03-23 ENCOUNTER — Ambulatory Visit: Admitting: Neurosurgery
